# Patient Record
Sex: Female | Born: 1945 | Race: Black or African American | Hispanic: No | Smoking: Never smoker
Health system: Southern US, Community
[De-identification: ages and names within clinical notes are randomized; demographics above are authoritative.]

## PROBLEM LIST (undated history)

## (undated) DIAGNOSIS — I10 Essential (primary) hypertension: Secondary | ICD-10-CM

## (undated) DIAGNOSIS — C539 Malignant neoplasm of cervix uteri, unspecified: Secondary | ICD-10-CM

## (undated) DIAGNOSIS — D649 Anemia, unspecified: Secondary | ICD-10-CM

## (undated) DIAGNOSIS — I739 Peripheral vascular disease, unspecified: Secondary | ICD-10-CM

## (undated) DIAGNOSIS — I639 Cerebral infarction, unspecified: Secondary | ICD-10-CM

## (undated) HISTORY — PX: ABDOMINAL HYSTERECTOMY: SHX81

## (undated) HISTORY — PX: TONSILLECTOMY: SUR1361

## (undated) HISTORY — PX: FEMORAL ARTERY - FEMORAL ARTERY BYPASS GRAFT: SUR179

---

## 2015-01-08 ENCOUNTER — Emergency Department (HOSPITAL_BASED_OUTPATIENT_CLINIC_OR_DEPARTMENT_OTHER)
Admission: EM | Admit: 2015-01-08 | Discharge: 2015-01-08 | Disposition: A | Discharge status: Home / Self Care | Payer: Medicare Other | Attending: Emergency Medicine | Admitting: Emergency Medicine

## 2015-01-08 ENCOUNTER — Encounter (HOSPITAL_BASED_OUTPATIENT_CLINIC_OR_DEPARTMENT_OTHER): Payer: Self-pay | Admitting: *Deleted

## 2015-01-08 DIAGNOSIS — Z8541 Personal history of malignant neoplasm of cervix uteri: Secondary | ICD-10-CM | POA: Diagnosis not present

## 2015-01-08 DIAGNOSIS — D649 Anemia, unspecified: Secondary | ICD-10-CM | POA: Insufficient documentation

## 2015-01-08 DIAGNOSIS — Z8673 Personal history of transient ischemic attack (TIA), and cerebral infarction without residual deficits: Secondary | ICD-10-CM | POA: Insufficient documentation

## 2015-01-08 DIAGNOSIS — R030 Elevated blood-pressure reading, without diagnosis of hypertension: Secondary | ICD-10-CM

## 2015-01-08 DIAGNOSIS — Z79899 Other long term (current) drug therapy: Secondary | ICD-10-CM | POA: Insufficient documentation

## 2015-01-08 DIAGNOSIS — I10 Essential (primary) hypertension: Secondary | ICD-10-CM | POA: Diagnosis present

## 2015-01-08 HISTORY — DX: Essential (primary) hypertension: I10

## 2015-01-08 HISTORY — DX: Peripheral vascular disease, unspecified: I73.9

## 2015-01-08 HISTORY — DX: Malignant neoplasm of cervix uteri, unspecified: C53.9

## 2015-01-08 HISTORY — DX: Cerebral infarction, unspecified: I63.9

## 2015-01-08 HISTORY — DX: Anemia, unspecified: D64.9

## 2015-01-08 LAB — BASIC METABOLIC PANEL
Anion gap: 9 (ref 5–15)
BUN: 12 mg/dL (ref 6–20)
CALCIUM: 8.9 mg/dL (ref 8.9–10.3)
CO2: 27 mmol/L (ref 22–32)
Chloride: 107 mmol/L (ref 101–111)
Creatinine, Ser: 0.94 mg/dL (ref 0.44–1.00)
GLUCOSE: 109 mg/dL — AB (ref 65–99)
POTASSIUM: 3.9 mmol/L (ref 3.5–5.1)
SODIUM: 143 mmol/L (ref 135–145)

## 2015-01-08 LAB — CBC WITH DIFFERENTIAL/PLATELET
BASOS ABS: 0 10*3/uL (ref 0.0–0.1)
Basophils Relative: 1 % (ref 0–1)
EOS PCT: 2 % (ref 0–5)
Eosinophils Absolute: 0.1 10*3/uL (ref 0.0–0.7)
HCT: 28.7 % — ABNORMAL LOW (ref 36.0–46.0)
Hemoglobin: 9.2 g/dL — ABNORMAL LOW (ref 12.0–15.0)
LYMPHS ABS: 0.9 10*3/uL (ref 0.7–4.0)
LYMPHS PCT: 18 % (ref 12–46)
MCH: 30.3 pg (ref 26.0–34.0)
MCHC: 32.1 g/dL (ref 30.0–36.0)
MCV: 94.4 fL (ref 78.0–100.0)
Monocytes Absolute: 0.5 10*3/uL (ref 0.1–1.0)
Monocytes Relative: 10 % (ref 3–12)
NEUTROS PCT: 69 % (ref 43–77)
Neutro Abs: 3.6 10*3/uL (ref 1.7–7.7)
Platelets: 152 10*3/uL (ref 150–400)
RBC: 3.04 MIL/uL — AB (ref 3.87–5.11)
RDW: 12.6 % (ref 11.5–15.5)
WBC: 5.2 10*3/uL (ref 4.0–10.5)

## 2015-01-08 NOTE — ED Notes (Signed)
Pt c/o increased BP x 24 hrs

## 2015-01-08 NOTE — ED Provider Notes (Signed)
CSN: [id number]     Arrival date & time 01/08/15  0044 History   First MD Initiated Contact with Patient 01/08/15 0125     Chief Complaint  Patient presents with  . Hypertension     (Consider location/radiation/quality/duration/timing/severity/associated sxs/prior Treatment) HPI  This is a 69 year old female with a history of hypertension on carvedilol. Her blood pressures usually run 140s-160s/70s-80s. Yesterday morning her blood pressure was within his usual range. Yesterday evening it was as high as 201/116 and repeated it was 199/117. On arrival here it was 207/84. Her only complaint is some mild pressure in her left forehead. She denies any blurred vision, numbness or weakness, chest pain, shortness of breath, vomiting or diarrhea. She did have some transient nausea yesterday.  Past Medical History  Diagnosis Date  . Hypertension   . Cervical cancer   . Stroke   . Peripheral vascular disease    Past Surgical History  Procedure Laterality Date  . Abdominal hysterectomy    . Cesarean section    . Tonsillectomy    . 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 No pertinent family history. History  Substance Use Topics  . Smoking status: Never Smoker   . Smokeless tobacco: Not on file  . Alcohol Use: No   OB History    No data available     Review of Systems  All other systems reviewed and are negative.   Allergies  Caffeine and Codeine  Home Medications   Prior to Admission medications   Medication Sig Start Date End Date Taking? Authorizing Provider  carvedilol (COREG) 12.5 MG tablet Take 12.5 mg by mouth 2 (two) times daily with a meal.   Yes Historical Provider, MD  PARoxetine (PAXIL) 20 MG tablet Take 20 mg by mouth daily.   Yes Historical Provider, MD  Pitavastatin Calcium (LIVALO PO) Take by mouth.   Yes Historical Provider, MD  Vitamin D, Ergocalciferol, (DRISDOL) 50000 UNITS CAPS capsule Take 50,000 Units by mouth every 7 (seven)  days.   Yes Historical Provider, MD   BP 207/84 mmHg  Pulse 75  Temp(Src) 98.7 F (37.1 C) (Oral)  Resp 18  Ht 5\' 1"  (1.549 m)  Wt 216 lb (97.977 kg)  BMI 40.83 kg/m2  SpO2 99%   Physical Exam  General: Well-developed, well-nourished female in no acute distress; appearance consistent with age of record HENT: normocephalic; atraumatic Eyes: pupils equal, round and reactive to light; extraocular muscles intact; lens implants Neck: supple Heart: regular rate and rhythm Lungs: clear to auscultation bilaterally Abdomen: soft; nondistended; nontender; no masses or hepatosplenomegaly; bowel sounds present Extremities: No deformity; full range of motion; pulses normal Neurologic: Awake, alert and oriented; motor function intact in all extremities and symmetric; no facial droop Skin: Warm and dry Psychiatric: Normal mood and affect    ED Course  Procedures (including critical care time)   MDM  Nursing notes and vitals signs, including pulse oximetry, reviewed.  Summary of this visit's results, reviewed by myself:  Labs:  Results for orders placed or performed during the hospital encounter of 01/08/15 (from the past 24 hour(s))  CBC with Differential/Platelet     Status: Abnormal   Collection Time: 01/08/15  1:50 AM  Result Value Ref Range   WBC 5.2 4.0 - 10.5 K/uL   RBC 3.04 (L) 3.87 - 5.11 MIL/uL   Hemoglobin 9.2 (L) 12.0 - 15.0 g/dL   HCT 28.7 (L) 36.0 - 46.0 %   MCV 94.4  78.0 - 100.0 fL   MCH 30.3 26.0 - 34.0 pg   MCHC 32.1 30.0 - 36.0 g/dL   RDW 12.6 11.5 - 15.5 %   Platelets 152 150 - 400 K/uL   Neutrophils Relative % 69 43 - 77 %   Neutro Abs 3.6 1.7 - 7.7 K/uL   Lymphocytes Relative 18 12 - 46 %   Lymphs Abs 0.9 0.7 - 4.0 K/uL   Monocytes Relative 10 3 - 12 %   Monocytes Absolute 0.5 0.1 - 1.0 K/uL   Eosinophils Relative 2 0 - 5 %   Eosinophils Absolute 0.1 0.0 - 0.7 K/uL   Basophils Relative 1 0 - 1 %   Basophils Absolute 0.0 0.0 - 0.1 K/uL  Basic metabolic  panel     Status: Abnormal   Collection Time: 01/08/15  1:50 AM  Result Value Ref Range   Sodium 143 135 - 145 mmol/L   Potassium 3.9 3.5 - 5.1 mmol/L   Chloride 107 101 - 111 mmol/L   CO2 27 22 - 32 mmol/L   Glucose, Bld 109 (H) 65 - 99 mg/dL   BUN 12 6 - 20 mg/dL   Creatinine, Ser 0.94 0.44 - 1.00 mg/dL   Calcium 8.9 8.9 - 10.3 mg/dL   GFR calc non Af Amer >60 >60 mL/min   GFR calc Af Amer >60 >60 mL/min   Anion gap 9 5 - 15   Patient advised of reassuring lab tests. She is anemic but has a history of this. She was advised not to put excessive worry and to elevated blood pressure. Her daughter states that she frequently takes her blood pressure multiple times out of worry that it is too high. She will follow up with her primary care physician regarding her blood pressure and possible medication adjustments. Acute intervention is not indicated at this time.   Shanon Rosser, MD 01/08/15 [phone]

## 2015-01-08 NOTE — Discharge Instructions (Signed)
Hypertension Hypertension, commonly called high blood pressure, is when the force of blood pumping through your arteries is too strong. Your arteries are the blood vessels that carry blood from your heart throughout your body. A blood pressure reading consists of a higher number over a lower number, such as 110/72. The higher number (systolic) is the pressure inside your arteries when your heart pumps. The lower number (diastolic) is the pressure inside your arteries when your heart relaxes. Ideally you want your blood pressure below 120/80. Hypertension forces your heart to work harder to pump blood. Your arteries may become narrow or stiff. Having hypertension puts you at risk for heart disease, stroke, and other problems.  RISK FACTORS Some risk factors for high blood pressure are controllable. Others are not.  Risk factors you cannot control include:   Race. You may be at higher risk if you are African American.  Age. Risk increases with age.  Gender. Men are at higher risk than women before age 93 years. After age 83, women are at higher risk than men. Risk factors you can control include:  Not getting enough exercise or physical activity.  Being overweight.  Getting too much fat, sugar, calories, or salt in your diet.  Drinking too much alcohol. SIGNS AND SYMPTOMS Hypertension does not usually cause signs or symptoms. Extremely high blood pressure (hypertensive crisis) may cause headache, anxiety, shortness of breath, and nosebleed. DIAGNOSIS  To check if you have hypertension, your health care provider will measure your blood pressure while you are seated, with your arm held at the level of your heart. It should be measured at least twice using the same arm. Certain conditions can cause a difference in blood pressure between your right and left arms. A blood pressure reading that is higher than normal on one occasion does not mean that you need treatment. If one blood pressure reading  is high, ask your health care provider about having it checked again. TREATMENT  Treating high blood pressure includes making lifestyle changes and possibly taking medicine. Living a healthy lifestyle can help lower high blood pressure. You may need to change some of your habits. Lifestyle changes may include:  Following the DASH diet. This diet is high in fruits, vegetables, and whole grains. It is low in salt, red meat, and added sugars.  Getting at least 2 hours of brisk physical activity every week.  Losing weight if necessary.  Not smoking.  Limiting alcoholic beverages.  Learning ways to reduce stress. If lifestyle changes are not enough to get your blood pressure under control, your health care provider may prescribe medicine. You may need to take more than one. Work closely with your health care provider to understand the risks and benefits. HOME CARE INSTRUCTIONS  Have your blood pressure rechecked as directed by your health care provider.   Take medicines only as directed by your health care provider. Follow the directions carefully. Blood pressure medicines must be taken as prescribed. The medicine does not work as well when you skip doses. Skipping doses also puts you at risk for problems.   Do not smoke.   Monitor your blood pressure at home as directed by your health care provider.  SEEK IMMEDIATE MEDICAL CARE IF:  You develop a severe headache or confusion.  You have unusual weakness, numbness, or feel faint.  You have severe chest or abdominal pain.  You vomit repeatedly.  You have trouble breathing. MAKE SURE YOU:   Understand these instructions.  Will watch  your condition.  Will get help right away if you are not doing well or get worse. Document Released: 08/09/2005 Document Revised: 12/24/2013 Document Reviewed: 06/01/2013 [facility] Patient Information 2015 [place], [place]. This information is not intended to replace advice given to you by your  health care provider. Make sure you discuss any questions you have with your health care provider.

## 2015-07-22 ENCOUNTER — Other Ambulatory Visit: Payer: Self-pay

## 2015-07-22 ENCOUNTER — Encounter (HOSPITAL_BASED_OUTPATIENT_CLINIC_OR_DEPARTMENT_OTHER): Payer: Self-pay | Admitting: Emergency Medicine

## 2015-07-22 ENCOUNTER — Emergency Department (HOSPITAL_BASED_OUTPATIENT_CLINIC_OR_DEPARTMENT_OTHER)
Admission: EM | Admit: 2015-07-22 | Discharge: 2015-07-22 | Disposition: A | Discharge status: Home / Self Care | Payer: Medicare Other | Attending: Emergency Medicine | Admitting: Emergency Medicine

## 2015-07-22 DIAGNOSIS — N390 Urinary tract infection, site not specified: Secondary | ICD-10-CM | POA: Diagnosis not present

## 2015-07-22 DIAGNOSIS — I10 Essential (primary) hypertension: Secondary | ICD-10-CM | POA: Insufficient documentation

## 2015-07-22 DIAGNOSIS — D649 Anemia, unspecified: Secondary | ICD-10-CM | POA: Diagnosis not present

## 2015-07-22 DIAGNOSIS — R51 Headache: Secondary | ICD-10-CM | POA: Diagnosis not present

## 2015-07-22 DIAGNOSIS — Z8673 Personal history of transient ischemic attack (TIA), and cerebral infarction without residual deficits: Secondary | ICD-10-CM | POA: Diagnosis not present

## 2015-07-22 DIAGNOSIS — Z8541 Personal history of malignant neoplasm of cervix uteri: Secondary | ICD-10-CM | POA: Insufficient documentation

## 2015-07-22 DIAGNOSIS — Z79899 Other long term (current) drug therapy: Secondary | ICD-10-CM | POA: Diagnosis not present

## 2015-07-22 LAB — URINALYSIS, ROUTINE W REFLEX MICROSCOPIC
Bilirubin Urine: NEGATIVE
GLUCOSE, UA: NEGATIVE mg/dL
HGB URINE DIPSTICK: NEGATIVE
KETONES UR: NEGATIVE mg/dL
Nitrite: NEGATIVE
PH: 7.5 (ref 5.0–8.0)
Protein, ur: NEGATIVE mg/dL
Specific Gravity, Urine: 1.02 (ref 1.005–1.030)

## 2015-07-22 LAB — URINE MICROSCOPIC-ADD ON

## 2015-07-22 MED ORDER — CEPHALEXIN 500 MG PO CAPS: 500.0000 mg | ORAL_CAPSULE | Freq: Two times a day (BID) | ORAL | Status: DC

## 2015-07-22 NOTE — Discharge Instructions (Signed)
Hypertension Hypertension, commonly called high blood pressure, is when the force of blood pumping through your arteries is too strong. Your arteries are the blood vessels that carry blood from your heart throughout your body. A blood pressure reading consists of a higher number over a lower number, such as 110/72. The higher number (systolic) is the pressure inside your arteries when your heart pumps. The lower number (diastolic) is the pressure inside your arteries when your heart relaxes. Ideally you want your blood pressure below 120/80. Hypertension forces your heart to work harder to pump blood. Your arteries may become narrow or stiff. Having untreated or uncontrolled hypertension can cause heart attack, stroke, kidney disease, and other problems. RISK FACTORS Some risk factors for high blood pressure are controllable. Others are not.  Risk factors you cannot control include:   Race. You may be at higher risk if you are African American.  Age. Risk increases with age.  Gender. Men are at higher risk than women before age 45 years. After age 65, women are at higher risk than men. Risk factors you can control include:  Not getting enough exercise or physical activity.  Being overweight.  Getting too much fat, sugar, calories, or salt in your diet.  Drinking too much alcohol. SIGNS AND SYMPTOMS Hypertension does not usually cause signs or symptoms. Extremely high blood pressure (hypertensive crisis) may cause headache, anxiety, shortness of breath, and nosebleed. DIAGNOSIS To check if you have hypertension, your health care provider will measure your blood pressure while you are seated, with your arm held at the level of your heart. It should be measured at least twice using the same arm. Certain conditions can cause a difference in blood pressure between your right and left arms. A blood pressure reading that is higher than normal on one occasion does not mean that you need treatment. If  it is not clear whether you have high blood pressure, you may be asked to return on a different day to have your blood pressure checked again. Or, you may be asked to monitor your blood pressure at home for 1 or more weeks. TREATMENT Treating high blood pressure includes making lifestyle changes and possibly taking medicine. Living a healthy lifestyle can help lower high blood pressure. You may need to change some of your habits. Lifestyle changes may include:  Following the DASH diet. This diet is high in fruits, vegetables, and whole grains. It is low in salt, red meat, and added sugars.  Keep your sodium intake below 2,300 mg per day.  Getting at least 30-45 minutes of aerobic exercise at least 4 times per week.  Losing weight if necessary.  Not smoking.  Limiting alcoholic beverages.  Learning ways to reduce stress. Your health care provider may prescribe medicine if lifestyle changes are not enough to get your blood pressure under control, and if one of the following is true:  You are 18-59 years of age and your systolic blood pressure is above 140.  You are 60 years of age or older, and your systolic blood pressure is above 150.  Your diastolic blood pressure is above 90.  You have diabetes, and your systolic blood pressure is over 140 or your diastolic blood pressure is over 90.  You have kidney disease and your blood pressure is above 140/90.  You have heart disease and your blood pressure is above 140/90. Your personal target blood pressure may vary depending on your medical conditions, your age, and other factors. HOME CARE INSTRUCTIONS    Have your blood pressure rechecked as directed by your health care provider.   Take medicines only as directed by your health care provider. Follow the directions carefully. Blood pressure medicines must be taken as prescribed. The medicine does not work as well when you skip doses. Skipping doses also puts you at risk for  problems.  Do not smoke.   Monitor your blood pressure at home as directed by your health care provider. SEEK MEDICAL CARE IF:   You think you are having a reaction to medicines taken.  You have recurrent headaches or feel dizzy.  You have swelling in your ankles.  You have trouble with your vision. SEEK IMMEDIATE MEDICAL CARE IF:  You develop a severe headache or confusion.  You have unusual weakness, numbness, or feel faint.  You have severe chest or abdominal pain.  You vomit repeatedly.  You have trouble breathing. MAKE SURE YOU:   Understand these instructions.  Will watch your condition.  Will get help right away if you are not doing well or get worse.   This information is not intended to replace advice given to you by your health care provider. Make sure you discuss any questions you have with your health care provider.   Document Released: 08/09/2005 Document Revised: 12/24/2014 Document Reviewed: 06/01/2013 Elsevier Interactive Patient Education 2016 [employer]  Urinary Tract Infection Urinary tract infections (UTIs) can develop anywhere along your urinary tract. Your urinary tract is your body's drainage system for removing wastes and extra water. Your urinary tract includes two kidneys, two ureters, a bladder, and a urethra. Your kidneys are a pair of bean-shaped organs. Each kidney is about the size of your fist. They are located below your ribs, one on each side of your spine. CAUSES Infections are caused by microbes, which are microscopic organisms, including fungi, viruses, and bacteria. These organisms are so small that they can only be seen through a microscope. Bacteria are the microbes that most commonly cause UTIs. SYMPTOMS  Symptoms of UTIs may vary by age and gender of the patient and by the location of the infection. Symptoms in young women typically include a frequent and intense urge to urinate and a painful, burning feeling in the bladder or  urethra during urination. Older women and men are more likely to be tired, shaky, and weak and have muscle aches and abdominal pain. A fever may mean the infection is in your kidneys. Other symptoms of a kidney infection include pain in your back or sides below the ribs, nausea, and vomiting. DIAGNOSIS To diagnose a UTI, your caregiver will ask you about your symptoms. Your caregiver will also ask you to provide a urine sample. The urine sample will be tested for bacteria and white blood cells. White blood cells are made by your body to help fight infection. TREATMENT  Typically, UTIs can be treated with medication. Because most UTIs are caused by a bacterial infection, they usually can be treated with the use of antibiotics. The choice of antibiotic and length of treatment depend on your symptoms and the type of bacteria causing your infection. HOME CARE INSTRUCTIONS  If you were prescribed antibiotics, take them exactly as your caregiver instructs you. Finish the medication even if you feel better after you have only taken some of the medication.  Drink enough water and fluids to keep your urine clear or pale yellow.  Avoid caffeine, tea, and carbonated beverages. They tend to irritate your bladder.  Empty your bladder often. Avoid holding  urine for long periods of time.  Empty your bladder before and after sexual intercourse.  After a bowel movement, women should cleanse from front to back. Use each tissue only once. SEEK MEDICAL CARE IF:   You have back pain.  You develop a fever.  Your symptoms do not begin to resolve within 3 days. SEEK IMMEDIATE MEDICAL CARE IF:   You have severe back pain or lower abdominal pain.  You develop chills.  You have nausea or vomiting.  You have continued burning or discomfort with urination. MAKE SURE YOU:   Understand these instructions.  Will watch your condition.  Will get help right away if you are not doing well or get worse.   This  information is not intended to replace advice given to you by your health care provider. Make sure you discuss any questions you have with your health care provider.   Document Released: 05/19/2005 Document Revised: 04/30/2015 Document Reviewed: 09/17/2011 Elsevier Interactive Patient Education [employer].

## 2015-07-22 NOTE — ED Notes (Signed)
Elevated bp since yesterday.  Some nausea and left sided headache since yesterday.  No other new symptoms.

## 2015-07-22 NOTE — ED Provider Notes (Signed)
CSN: [id number]     Arrival date & time 07/22/15  [id number] History   First MD Initiated Contact with Patient 07/22/15 0830     Chief Complaint  Patient presents with  . Hypertension     (Consider location/radiation/quality/duration/timing/severity/associated sxs/prior Treatment) Patient is a 69 y.o. female presenting with hypertension. The history is provided by the patient.  Hypertension This is a recurrent problem. Associated symptoms include headaches. Pertinent negatives include no chest pain, no abdominal pain and no shortness of breath.   patient resents with high blood pressure. Reportedly had pressures of 123456 systolic last night. States she was having headache and felt a little anxious with it. No chest pain or trouble breathing. She had some nausea. States she's also had some chills. States she feels better now. States she does have a history of anxiety. She checks her blood pressure and get somewhat nervous about the results. No numbness or weakness. No recent change in her medications.  Past Medical History  Diagnosis Date  . Hypertension   . Cervical cancer ([place])   . Stroke ([place])   . Peripheral vascular disease ([place])   . Anemia    Past Surgical History  Procedure Laterality Date  . Abdominal hysterectomy    . Cesarean section    . Tonsillectomy    . 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 Social History  Substance Use Topics  . Smoking status: Never Smoker   . Smokeless tobacco: None  . Alcohol Use: No   OB History    No data available     Review of Systems  Constitutional: Positive for chills. Negative for activity change and appetite change.  Eyes: Negative for pain.  Respiratory: Negative for chest tightness and shortness of breath.   Cardiovascular: Negative for chest pain and leg swelling.  Gastrointestinal: Negative for nausea, vomiting, abdominal pain and diarrhea.  Genitourinary: Positive for frequency. Negative for  flank pain.  Musculoskeletal: Negative for back pain and neck stiffness.  Skin: Negative for rash.  Neurological: Positive for headaches. Negative for weakness and numbness.  Psychiatric/Behavioral: Negative for behavioral problems.      Allergies  Caffeine and Codeine  Home Medications   Prior to Admission medications   Medication Sig Start Date End Date Taking? Authorizing Provider  ferrous sulfate 325 (65 FE) MG tablet Take 325 mg by mouth daily with breakfast.   Yes Historical Provider, MD  carvedilol (COREG) 12.5 MG tablet Take 12.5 mg by mouth 2 (two) times daily with a meal.    Historical Provider, MD  cephALEXin (KEFLEX) 500 MG capsule Take 1 capsule (500 mg total) by mouth 2 (two) times daily. 07/22/15   Davonna Belling, MD  PARoxetine (PAXIL) 20 MG tablet Take 10 mg by mouth every other day.     Historical Provider, MD  Pitavastatin Calcium (LIVALO PO) Take by mouth.    Historical Provider, MD  Vitamin D, Ergocalciferol, (DRISDOL) 50000 UNITS CAPS capsule Take 50,000 Units by mouth every 7 (seven) days.    Historical Provider, MD   BP 163/91 mmHg  Pulse 74  Temp(Src) 98.4 F (36.9 C) (Oral)  Resp 16  SpO2 99% Physical Exam  Constitutional: She appears well-developed and well-nourished.  HENT:  Head: Normocephalic.  Neck: Normal range of motion.  Cardiovascular: Normal rate, regular rhythm and normal heart sounds.   No murmur heard. Pulmonary/Chest: Effort normal and breath sounds normal. No respiratory distress.  Abdominal: Soft. Bowel sounds are  normal. She exhibits no distension.  Musculoskeletal: Normal range of motion. She exhibits no edema.  Neurological: She is alert.  Skin: Skin is warm.  Psychiatric: She has a normal mood and affect. Her speech is normal.  Nursing note and vitals reviewed.   ED Course  Procedures (including critical care time) Labs Review Labs Reviewed  URINALYSIS, ROUTINE W REFLEX MICROSCOPIC (NOT AT [facility]) - Abnormal; Notable for  the following:    APPearance CLOUDY (*)    Leukocytes, UA MODERATE (*)    All other components within normal limits  URINE MICROSCOPIC-ADD ON - Abnormal; Notable for the following:    Squamous Epithelial / LPF 0-5 (*)    Bacteria, UA FEW (*)    All other components within normal limits    Imaging Review No results found. I have personally reviewed and evaluated these images and lab results as part of my medical decision-making.   EKG Interpretation   Date/Time:  Tuesday July 22 2015 09:26:27 EST Ventricular Rate:  74 PR Interval:  164 QRS Duration: 78 QT Interval:  386 QTC Calculation: 428 R Axis:   -26 Text Interpretation:  Normal sinus rhythm Minimal voltage criteria for  LVH, may be normal variant Borderline ECG Confirmed by Alvino Chapel  MD,  Sylvia Helms [id number]) on 07/22/2015 9:42:22 AM      MDM   Final diagnoses:  Essential hypertension  Lower urinary tract infection    Patient with weaknes and had chills. Doubt end organ damage. Will treat for UTI.     Davonna Belling, MD 07/22/15 1538

## 2015-12-24 ENCOUNTER — Other Ambulatory Visit: Payer: Self-pay | Admitting: Rheumatology

## 2015-12-24 ENCOUNTER — Ambulatory Visit
Admission: RE | Admit: 2015-12-24 | Discharge: 2015-12-24 | Disposition: A | Discharge status: Home / Self Care | Payer: Medicare Other | Source: Ambulatory Visit | Attending: Rheumatology | Admitting: Rheumatology

## 2015-12-24 DIAGNOSIS — M549 Dorsalgia, unspecified: Secondary | ICD-10-CM

## 2016-08-11 DIAGNOSIS — I10 Essential (primary) hypertension: Secondary | ICD-10-CM | POA: Insufficient documentation

## 2016-08-11 DIAGNOSIS — Z79899 Other long term (current) drug therapy: Secondary | ICD-10-CM | POA: Insufficient documentation

## 2016-08-11 DIAGNOSIS — J0100 Acute maxillary sinusitis, unspecified: Secondary | ICD-10-CM | POA: Diagnosis not present

## 2016-08-11 DIAGNOSIS — R350 Frequency of micturition: Secondary | ICD-10-CM | POA: Diagnosis not present

## 2016-08-11 DIAGNOSIS — R51 Headache: Secondary | ICD-10-CM | POA: Diagnosis present

## 2016-08-12 ENCOUNTER — Encounter (HOSPITAL_BASED_OUTPATIENT_CLINIC_OR_DEPARTMENT_OTHER): Payer: Self-pay

## 2016-08-12 ENCOUNTER — Emergency Department (HOSPITAL_BASED_OUTPATIENT_CLINIC_OR_DEPARTMENT_OTHER)
Admission: EM | Admit: 2016-08-12 | Discharge: 2016-08-12 | Disposition: A | Discharge status: Home / Self Care | Payer: Medicare Other | Attending: Emergency Medicine | Admitting: Emergency Medicine

## 2016-08-12 DIAGNOSIS — R350 Frequency of micturition: Secondary | ICD-10-CM

## 2016-08-12 DIAGNOSIS — J0100 Acute maxillary sinusitis, unspecified: Secondary | ICD-10-CM

## 2016-08-12 LAB — URINALYSIS, MICROSCOPIC (REFLEX): RBC / HPF: NONE SEEN RBC/hpf (ref 0–5)

## 2016-08-12 LAB — URINALYSIS, ROUTINE W REFLEX MICROSCOPIC
BILIRUBIN URINE: NEGATIVE
Glucose, UA: NEGATIVE mg/dL
Hgb urine dipstick: NEGATIVE
Ketones, ur: NEGATIVE mg/dL
Nitrite: NEGATIVE
PROTEIN: NEGATIVE mg/dL
Specific Gravity, Urine: 1.012 (ref 1.005–1.030)
pH: 7 (ref 5.0–8.0)

## 2016-08-12 LAB — CBG MONITORING, ED: GLUCOSE-CAPILLARY: 102 mg/dL — AB (ref 65–99)

## 2016-08-12 MED ORDER — DOXYCYCLINE HYCLATE 100 MG PO TABS
100.0000 mg | ORAL_TABLET | Freq: Two times a day (BID) | ORAL | Status: DC
  Administered 2016-08-12: 100 mg via ORAL
  Filled 2016-08-12: qty 1

## 2016-08-12 MED ORDER — DOXYCYCLINE HYCLATE 100 MG PO CAPS: 100.0000 mg | ORAL_CAPSULE | Freq: Two times a day (BID) | ORAL | 0 refills | Status: DC

## 2016-08-12 NOTE — ED Notes (Signed)
Pt has been unable to give a urine sample

## 2016-08-12 NOTE — ED Notes (Signed)
ED Provider at bedside. 

## 2016-08-12 NOTE — ED Triage Notes (Signed)
Pt c/o nasal congestion and left side facial pain for the last two weeks with a dry mouth and frequent urination.  Denies painful urination and denies hx of diabetes.

## 2016-08-12 NOTE — ED Provider Notes (Signed)
[facility] Provider Note: Georgena Spurling, MD, FACEP  CSN: [id number] MRN: [id number] ARRIVAL: 08/11/16 at 2347 ROOM: [place]  Facial Pain   HISTORY OF PRESENT ILLNESS  Katherine Carrillo is a 70 y.o. female with a several week history of sinus fullness on her left side. It involves the left frontal and maxillary sinuses. She is also having a fullness in her left ear which feels like her "canal is closing up". She does not describe fullness is a frank pain. She has been using an over-the-counter nasal steroid without relief. She has also been having frequent urination but no dysuria. She admits to drinking a lot of water.   Past Medical History:  Diagnosis Date  . Anemia   . Cervical cancer ([place])   . Hypertension   . Peripheral vascular disease ([place])   . Stroke [facility])     Past Surgical History:  Procedure Laterality Date  . ABDOMINAL HYSTERECTOMY    . CESAREAN SECTION    . FEMORAL ARTERY - FEMORAL ARTERY BYPASS GRAFT    . TONSILLECTOMY      No family history on file.  Social History  Substance Use Topics  . Smoking status: Never Smoker  . Smokeless tobacco: Not on file  . Alcohol use No    Prior to Admission medications   Medication Sig Start Date End Date Taking? Authorizing Provider  carvedilol (COREG) 12.5 MG tablet Take 12.5 mg by mouth 2 (two) times daily with a meal.    Historical Provider, MD  cephALEXin (KEFLEX) 500 MG capsule Take 1 capsule (500 mg total) by mouth 2 (two) times daily. 07/22/15   Davonna Belling, MD  doxycycline (VIBRAMYCIN) 100 MG capsule Take 1 capsule (100 mg total) by mouth 2 (two) times daily. One po bid x 7 days 08/12/16   Shanon Rosser, MD  ferrous sulfate 325 (65 FE) MG tablet Take 325 mg by mouth daily with breakfast.    Historical Provider, MD  PARoxetine (PAXIL) 20 MG tablet Take 10 mg by mouth every other day.     Historical Provider, MD  Pitavastatin Calcium (LIVALO PO) Take by mouth.    Historical  Provider, MD  Vitamin D, Ergocalciferol, (DRISDOL) 50000 UNITS CAPS capsule Take 50,000 Units by mouth every 7 (seven) days.    Historical Provider, MD    Allergies Caffeine and Codeine   REVIEW OF SYSTEMS  Negative except as noted here or in the History of Present Illness.   PHYSICAL EXAMINATION  Initial Vital Signs Blood pressure 166/88, pulse 78, temperature 98.2 F (36.8 C), temperature source Oral, resp. rate 20, height 5\' 1"  (1.549 m), weight 200 lb (90.7 kg), SpO2 99 %.  Examination General: Well-developed, well-nourished female in no acute distress; appearance consistent with age of record HENT: normocephalic; atraumatic; mild tenderness to percussion over left maxillary sinus; TMs normal Eyes: pupils equal, round and reactive to light; extraocular muscles intact; lens implants Neck: supple Heart: regular rate and rhythm Lungs: clear to auscultation bilaterally Abdomen: soft; nondistended; nontender; no masses or hepatosplenomegaly; bowel sounds present Extremities: No deformity; full range of motion; pulses normal Neurologic: Awake, alert and oriented; motor function intact in all extremities and symmetric; no facial droop Skin: Warm and dry Psychiatric: Normal mood and affect   RESULTS  Summary of this visit's results, reviewed by myself:   EKG Interpretation  Date/Time:    Ventricular Rate:    PR Interval:    QRS Duration:   QT Interval:  QTC Calculation:   R Axis:     Text Interpretation:        Laboratory Studies: Results for orders placed or performed during the hospital encounter of 08/12/16 (from the past 24 hour(s))  CBG monitoring, ED     Status: Abnormal   Collection Time: 08/12/16 12:07 AM  Result Value Ref Range   Glucose-Capillary 102 (H) 65 - 99 mg/dL  Urinalysis, Routine w reflex microscopic     Status: Abnormal   Collection Time: 08/12/16 12:45 AM  Result Value Ref Range   Color, Urine YELLOW YELLOW   APPearance CLEAR CLEAR    Specific Gravity, Urine 1.012 1.005 - 1.030   pH 7.0 5.0 - 8.0   Glucose, UA NEGATIVE NEGATIVE mg/dL   Hgb urine dipstick NEGATIVE NEGATIVE   Bilirubin Urine NEGATIVE NEGATIVE   Ketones, ur NEGATIVE NEGATIVE mg/dL   Protein, ur NEGATIVE NEGATIVE mg/dL   Nitrite NEGATIVE NEGATIVE   Leukocytes, UA SMALL (A) NEGATIVE  Urinalysis, Microscopic (reflex)     Status: Abnormal   Collection Time: 08/12/16 12:45 AM  Result Value Ref Range   RBC / HPF NONE SEEN 0 - 5 RBC/hpf   WBC, UA 0-5 0 - 5 WBC/hpf   Bacteria, UA RARE (A) NONE SEEN   Squamous Epithelial / LPF 0-5 (A) NONE SEEN   Imaging Studies: No results found.  ED COURSE  Nursing notes and initial vitals signs, including pulse oximetry, reviewed.  Vitals:   08/12/16 0002  BP: 166/88  Pulse: 78  Resp: 20  Temp: 98.2 F (36.8 C)  TempSrc: Oral  SpO2: 99%  Weight: 200 lb (90.7 kg)  Height: 5\' 1"  (1.549 m)   2:19 AM We'll treat for likely acute sinusitis. Patient was reassured that her blood sugar and urinalysis were within normal limits.  PROCEDURES    ED DIAGNOSES     ICD-9-CM ICD-10-CM   1. Acute maxillary sinusitis, recurrence not specified 461.0 J01.00   2. Frequent urination 788.41 R35.0        Shanon Rosser, MD 08/12/16 [phone]

## 2016-12-22 ENCOUNTER — Emergency Department (HOSPITAL_BASED_OUTPATIENT_CLINIC_OR_DEPARTMENT_OTHER): Payer: Medicare Other

## 2016-12-22 ENCOUNTER — Observation Stay (HOSPITAL_BASED_OUTPATIENT_CLINIC_OR_DEPARTMENT_OTHER)
Admission: EM | Admit: 2016-12-22 | Discharge: 2016-12-23 | Disposition: A | Discharge status: Home / Self Care | Payer: Medicare Other | Attending: Internal Medicine | Admitting: Internal Medicine

## 2016-12-22 ENCOUNTER — Encounter (HOSPITAL_BASED_OUTPATIENT_CLINIC_OR_DEPARTMENT_OTHER): Payer: Self-pay

## 2016-12-22 ENCOUNTER — Observation Stay (HOSPITAL_COMMUNITY): Payer: Medicare Other

## 2016-12-22 DIAGNOSIS — Z8542 Personal history of malignant neoplasm of other parts of uterus: Secondary | ICD-10-CM | POA: Diagnosis not present

## 2016-12-22 DIAGNOSIS — E785 Hyperlipidemia, unspecified: Secondary | ICD-10-CM | POA: Insufficient documentation

## 2016-12-22 DIAGNOSIS — Z8673 Personal history of transient ischemic attack (TIA), and cerebral infarction without residual deficits: Secondary | ICD-10-CM | POA: Diagnosis not present

## 2016-12-22 DIAGNOSIS — Z6836 Body mass index (BMI) 36.0-36.9, adult: Secondary | ICD-10-CM | POA: Diagnosis not present

## 2016-12-22 DIAGNOSIS — I639 Cerebral infarction, unspecified: Secondary | ICD-10-CM

## 2016-12-22 DIAGNOSIS — E669 Obesity, unspecified: Secondary | ICD-10-CM | POA: Insufficient documentation

## 2016-12-22 DIAGNOSIS — R471 Dysarthria and anarthria: Secondary | ICD-10-CM | POA: Diagnosis present

## 2016-12-22 DIAGNOSIS — Z79899 Other long term (current) drug therapy: Secondary | ICD-10-CM | POA: Diagnosis not present

## 2016-12-22 DIAGNOSIS — R4781 Slurred speech: Secondary | ICD-10-CM | POA: Diagnosis not present

## 2016-12-22 DIAGNOSIS — G459 Transient cerebral ischemic attack, unspecified: Secondary | ICD-10-CM

## 2016-12-22 DIAGNOSIS — Z90710 Acquired absence of both cervix and uterus: Secondary | ICD-10-CM | POA: Diagnosis not present

## 2016-12-22 DIAGNOSIS — I10 Essential (primary) hypertension: Secondary | ICD-10-CM | POA: Diagnosis not present

## 2016-12-22 DIAGNOSIS — Z923 Personal history of irradiation: Secondary | ICD-10-CM | POA: Insufficient documentation

## 2016-12-22 DIAGNOSIS — I739 Peripheral vascular disease, unspecified: Secondary | ICD-10-CM

## 2016-12-22 DIAGNOSIS — Z95820 Peripheral vascular angioplasty status with implants and grafts: Secondary | ICD-10-CM | POA: Insufficient documentation

## 2016-12-22 DIAGNOSIS — D509 Iron deficiency anemia, unspecified: Secondary | ICD-10-CM | POA: Diagnosis not present

## 2016-12-22 DIAGNOSIS — I080 Rheumatic disorders of both mitral and aortic valves: Secondary | ICD-10-CM | POA: Insufficient documentation

## 2016-12-22 DIAGNOSIS — R4701 Aphasia: Secondary | ICD-10-CM

## 2016-12-22 DIAGNOSIS — G458 Other transient cerebral ischemic attacks and related syndromes: Secondary | ICD-10-CM | POA: Diagnosis not present

## 2016-12-22 DIAGNOSIS — R42 Dizziness and giddiness: Secondary | ICD-10-CM | POA: Diagnosis present

## 2016-12-22 LAB — I-STAT CHEM 8, ED
BUN: 13 mg/dL (ref 6–20)
CALCIUM ION: 1.02 mmol/L — AB (ref 1.15–1.40)
CHLORIDE: 101 mmol/L (ref 101–111)
CREATININE: 1.1 mg/dL — AB (ref 0.44–1.00)
GLUCOSE: 91 mg/dL (ref 65–99)
HCT: 33 % — ABNORMAL LOW (ref 36.0–46.0)
Hemoglobin: 11.2 g/dL — ABNORMAL LOW (ref 12.0–15.0)
POTASSIUM: 5.1 mmol/L (ref 3.5–5.1)
Sodium: 132 mmol/L — ABNORMAL LOW (ref 135–145)
TCO2: 24 mmol/L (ref 0–100)

## 2016-12-22 LAB — CBC
HEMATOCRIT: 32.4 % — AB (ref 36.0–46.0)
Hemoglobin: 10.9 g/dL — ABNORMAL LOW (ref 12.0–15.0)
MCH: 31.9 pg (ref 26.0–34.0)
MCHC: 33.6 g/dL (ref 30.0–36.0)
MCV: 94.7 fL (ref 78.0–100.0)
PLATELETS: 148 10*3/uL — AB (ref 150–400)
RBC: 3.42 MIL/uL — ABNORMAL LOW (ref 3.87–5.11)
RDW: 12 % (ref 11.5–15.5)
WBC: 4.5 10*3/uL (ref 4.0–10.5)

## 2016-12-22 LAB — DIFFERENTIAL
BASOS PCT: 0 %
Basophils Absolute: 0 10*3/uL (ref 0.0–0.1)
EOS PCT: 2 %
Eosinophils Absolute: 0.1 10*3/uL (ref 0.0–0.7)
LYMPHS PCT: 15 %
Lymphs Abs: 0.7 10*3/uL (ref 0.7–4.0)
MONO ABS: 0.6 10*3/uL (ref 0.1–1.0)
MONOS PCT: 13 %
NEUTROS ABS: 3.1 10*3/uL (ref 1.7–7.7)
Neutrophils Relative %: 70 %

## 2016-12-22 LAB — PROTIME-INR
INR: 1.03
PROTHROMBIN TIME: 13.5 s (ref 11.4–15.2)

## 2016-12-22 LAB — TROPONIN I: Troponin I: <0.03 ng/mL (ref <0.03)

## 2016-12-22 LAB — APTT: aPTT: 53 seconds — ABNORMAL HIGH (ref 24–36)

## 2016-12-22 LAB — CBG MONITORING, ED: Glucose-Capillary: 97 mg/dL (ref 65–99)

## 2016-12-22 MED ORDER — STROKE: EARLY STAGES OF RECOVERY BOOK: Freq: Once | Status: DC

## 2016-12-22 MED ORDER — ACETAMINOPHEN 160 MG/5ML PO SOLN: 650.0000 mg | ORAL | Status: DC | PRN

## 2016-12-22 MED ORDER — SENNOSIDES-DOCUSATE SODIUM 8.6-50 MG PO TABS: 1.0000 | ORAL_TABLET | Freq: Every evening | ORAL | Status: DC | PRN

## 2016-12-22 MED ORDER — SODIUM CHLORIDE 0.9 % IV SOLN
INTRAVENOUS | Status: AC
  Administered 2016-12-22: 19:00:00 via INTRAVENOUS

## 2016-12-22 MED ORDER — ASPIRIN 325 MG PO TABS: 325.0000 mg | ORAL_TABLET | Freq: Every day | ORAL | Status: DC

## 2016-12-22 MED ORDER — ACETAMINOPHEN 325 MG PO TABS: 650.0000 mg | ORAL_TABLET | ORAL | Status: DC | PRN

## 2016-12-22 MED ORDER — PAROXETINE HCL 20 MG PO TABS: 10.0000 mg | ORAL_TABLET | ORAL | Status: DC

## 2016-12-22 MED ORDER — CLOPIDOGREL BISULFATE 75 MG PO TABS
75.0000 mg | ORAL_TABLET | Freq: Every day | ORAL | Status: DC
  Administered 2016-12-23: 75 mg via ORAL
  Filled 2016-12-22: qty 1

## 2016-12-22 MED ORDER — ACETAMINOPHEN 650 MG RE SUPP: 650.0000 mg | RECTAL | Status: DC | PRN

## 2016-12-22 MED ORDER — ASPIRIN 300 MG RE SUPP: 300.0000 mg | Freq: Every day | RECTAL | Status: DC

## 2016-12-22 MED ORDER — ENOXAPARIN SODIUM 40 MG/0.4ML ~~LOC~~ SOLN
40.0000 mg | SUBCUTANEOUS | Status: DC
  Administered 2016-12-22: 40 mg via SUBCUTANEOUS
  Filled 2016-12-22: qty 0.4

## 2016-12-22 NOTE — H&P (Signed)
[facility] History and Physical  Katherine Carrillo YKD:[id number] DOB: [birth year] DOA: 12/22/2016   PCP: Velna Ochs, MD  Specialists: None  Chief Complaint: Difficulty speaking  HPI: Katherine Carrillo is a 71 y.o. female the past history of hypertension, previous history of stroke about 5 years ago when she was in [place], history of peripheral vascular disease, status post femoral bypass surgery to the right leg who was in her usual state of health till around 10:30 this morning when she was walking with her daughter and realized that she was having difficulty speaking. She could think of the words, but could not speak them out. Her daughter also noticed this difficulty. Then she started having some weakness in the right arm and leg. She felt unsteady on her feet. Denies any headaches, fever, chills. Denies any vision disturbances. Denies any difficulty swallowing. No nausea, vomiting. No tingling or numbness. No seizure type activity. She is on 81 mg of aspirin on a daily basis apart from her other medications. She takes her medications regularly. She went to the emergency department. She feels that her symptoms have improved about 20-30%.  After arriving to the emergency department her symptoms started improving. Discussions were held with neurologist, who felt that her symptoms were to mild for TPA. She was however, hospitalized for further management.  Home Medications: This list may not be accurate. Pharmacy to do medication reconciliation. Prior to Admission medications   Medication Sig Start Date End Date Taking? Authorizing Provider  carvedilol (COREG) 12.5 MG tablet Take 12.5 mg by mouth 2 (two) times daily with a meal.    Historical Provider, MD  ferrous sulfate 325 (65 FE) MG tablet Take 325 mg by mouth daily with breakfast.    Historical Provider, MD  PARoxetine (PAXIL) 20 MG tablet Take 10 mg by mouth every other day.     Historical Provider, MD  Pitavastatin Calcium (LIVALO  PO) Take by mouth.    Historical Provider, MD  Vitamin D, Ergocalciferol, (DRISDOL) 50000 UNITS CAPS capsule Take 50,000 Units by mouth every 7 (seven) days.    Historical Provider, MD    Allergies:  Allergies  Allergen Reactions  . Caffeine   . Codeine   States that she has tried taking a statin previously and has had side effects.  Past Medical History: Past Medical History:  Diagnosis Date  . Anemia   . Cervical cancer ([place])   . Hypertension   . Peripheral vascular disease ([place])   . Stroke [facility])     Past Surgical History:  Procedure Laterality Date  . ABDOMINAL HYSTERECTOMY    . CESAREAN SECTION    . FEMORAL ARTERY - FEMORAL ARTERY BYPASS GRAFT    . TONSILLECTOMY      Social History: Daughter lives with her. She denies smoking. No alcohol use. Denies any illicit drug use. Independent with her daily activities. Occasionally has to use a cane due to right hip pain.  Family History:  Family History  Problem Relation Age of Onset  . Diabetes Sister   . Diabetes Brother      Review of Systems - General ROS: positive for  - fatigue Psychological ROS: negative Ophthalmic ROS: negative ENT ROS: Sinus and left ear congestion Allergy and Immunology ROS: negative Hematological and Lymphatic ROS: negative Endocrine ROS: negative Respiratory ROS: no cough, shortness of breath, or wheezing Cardiovascular ROS: no chest pain or dyspnea on exertion Gastrointestinal ROS: no abdominal pain, change in bowel habits, or black or bloody stools Genito-Urinary ROS:  no dysuria, trouble voiding, or hematuria Musculoskeletal ROS: negative Neurological ROS: as in hpi Dermatological ROS: negative  Physical Examination  Vitals:   12/22/16 1541 12/22/16 1600 12/22/16 1631 12/22/16 1734  BP: (!) 145/78 (!) 148/72 (!) 147/89 (!) 152/73  Pulse: 64 64 99 64  Resp: (!) 22 (!) 25 18 20   Temp:    97.8 F (36.6 C)  TempSrc:    Oral  SpO2: 100% 99% 99% 98%    BP (!) 152/73 (BP Location:  Left Arm)   Pulse 64   Temp 97.8 F (36.6 C) (Oral)   Resp 20   SpO2 98%   General appearance: alert, cooperative, appears stated age and no distress Head: Normocephalic, without obvious abnormality, atraumatic Eyes: conjunctivae/corneas clear. PERRL, EOM's intact.  Throat: lips, mucosa, and tongue normal; teeth and gums normal Neck: no adenopathy, no carotid bruit, no JVD, supple, symmetrical, trachea midline and thyroid not enlarged, symmetric, no tenderness/mass/nodules Resp: clear to auscultation bilaterally Cardio: regular rate and rhythm, S1, S2 normal, no murmur, click, rub or gallop GI: soft, non-tender; bowel sounds normal; no masses,  no organomegaly Extremities: extremities normal, atraumatic, no cyanosis or edema Pulses: 2+ and symmetric Skin: Skin color, texture, turgor normal. No rashes or lesions Lymph nodes: Cervical, supraclavicular, and axillary nodes normal. Neurologic: Awake and alert. Oriented 3. Cranial nerves evaluated. No facial asymmetry. Tongue is midline. Expressive aphasia is noted. No pronator drift. Finger to nose normal. Motor strength equal bilateral upper and lower extremity.   Labs on Admission: I have personally reviewed following labs and imaging studies  CBC:  Recent Labs Lab 12/22/16 1304 12/22/16 1316  WBC 4.5  --   NEUTROABS 3.1  --   HGB 10.9* 11.2*  HCT 32.4* 33.0*  MCV 94.7  --   PLT 148*  --    Basic Metabolic Panel:  Recent Labs Lab 12/22/16 1316  NA 132*  K 5.1  CL 101  GLUCOSE 91  BUN 13  CREATININE 1.10*   Coagulation Profile:  Recent Labs Lab 12/22/16 1304  INR 1.03   Cardiac Enzymes:  Recent Labs Lab 12/22/16 1304  TROPONINI <0.03   Radiological Exams on Admission: Ct Head Code Stroke W/o Cm  Result Date: 12/22/2016 CLINICAL DATA:  Code stroke. 71 year old female with slurred speech, abnormal speech since 1030 hours today. Initial encounter. EXAM: CT HEAD WITHOUT CONTRAST TECHNIQUE: Contiguous axial  images were obtained from the base of the skull through the vertex without intravenous contrast. COMPARISON:  None. FINDINGS: Brain: No midline shift, mass effect, or evidence of intracranial mass lesion. No ventriculomegaly. No acute intracranial hemorrhage identified. Evidence of disproportionate cortical volume loss or encephalomalacia in the right parietal lobe (series 2, image 24 and sagittal image 22). No cortically based acute infarct identified. Gray-white matter differentiation elsewhere in the brain appears normal. Vascular: No suspicious intracranial vascular hyperdensity. Mild Calcified atherosclerosis at the skull base. Skull: Hyperostosis of the calvarium, normal variant. No acute osseous abnormality identified. Sinuses/Orbits: Clear. Other: Visualized orbits and scalp soft tissues are within normal limits. ASPECTS [facility] Stroke Program Early CT Score) - Ganglionic level infarction (caudate, lentiform nuclei, internal capsule, insula, M1-M3 cortex): 7 - Supraganglionic infarction (M4-M6 cortex): 3 Total score (0-10 with 10 being normal): 10 IMPRESSION: 1. No acute intracranial hemorrhage or acute cortically based infarct identified. 2. ASPECTS is 10. 3. Evidence of disproportionate right parietal lobe cerebral atrophy or less likely chronic cortical encephalomalacia. 4. Study discussed by telephone with Dr. Marda Stalker on 12/22/2016 at 13:07 .  Electronically Signed   By: Genevie Ann M.D.   On: 12/22/2016 13:08    My interpretation of Electrocardiogram: Sinus rhythm in the 70s. Normal axis. Intervals are normal. Nonspecific T-wave changes. No concerning ST changes.   Problem List  Principal Problem:   TIA (transient ischemic attack) Active Problems:   Dysarthria   Essential hypertension   History of stroke   PVD (peripheral vascular disease) (HCC)   Assessment: This is a 71 year old African-American female with past medical history as stated earlier, who presents with difficulty  speaking since about 10:30 this morning. Her symptoms have improved over the past many hours. She does not have any motor deficits on examination. Patient will need further evaluation.  Plan: #1 TIA versus stroke: Patient has had a previous history of stroke with the right-sided weakness about 5 years ago. Most of those deficits have resolved per patient and family. She takes aspirin on a daily basis. She tells me that she has not tolerated a statin previously. We will need to further evaluate this history. She will have further workup including MRI brain, MRA head. Echocardiogram and carotid Dopplers. Aspirin will be continued for now. Will likely need to be changed to an alternative antiplatelet agent at discharge. Lipid panel will be checked. PT and OT and SLP evaluation.  #2 history of essential hypertension: Hold her antihypertensive agents for now. Allow permissive hypertension.  #3 history of peripheral vascular disease: Seems to be stable.  #4 history of iron deficiency anemia: She is on Iron supplements at home.  #5 history of uterine cancer. This was about 9 years ago. She status post hysterectomy. She had radiation treatment at that time. Seems to be in remission.  Pharmacy to reconcile home medications.  DVT Prophylaxis: Lovenox Code Status: Full code Family Communication: Discussed with the patient and her daughter  Disposition Plan: Telemetry  Consults called: Neurology  Admission status: Observation status as anticipate that all workup will be completed by tomorrow.   Further management decisions will depend on results of further testing and patient's response to treatment.   [facility]  [facility] Pager [phone]  If 7PM-7AM, please contact night-coverage [email or web address] Password [facility]  12/22/2016, 6:15 PM

## 2016-12-22 NOTE — ED Notes (Signed)
Patient complaining about BP cuff. BP cuff decreased to q 1 hour while awaiting admission.

## 2016-12-22 NOTE — Progress Notes (Signed)
Patient arrived to room 5M03 by stretcher per [facility]. Patient ambulated with standby assist from [facility] to bathroom and then to the bed. Patient educated on how to use call bell and that she should call out for assistance to get up out of bed to avoid falls. Patient verbalized understanding of this information. Patient's daughter at bedside.

## 2016-12-22 NOTE — Progress Notes (Signed)
     Patient coming from [facility] for stroke workup. Stroke team initially consult by EDP and aware of pts transfer and will see pt in consultation after arrival. Per neurology team patient is not a TPA candidate based on presenting symptoms and timing. Patient had acute onset difficulty speaking at approximately 10:30. Symptoms remain constant and relatively unchanged since onset. CT head performed in ED was unremarkable. Patient has a history of prior CVA and hemiparesis which has slowly resolved over time. Patient is accepted to a telemetry under observation status and will undergo further workup upon arrival.  Linna Darner, MD [place] 12/22/2016, 1:21 PM

## 2016-12-22 NOTE — Progress Notes (Signed)
Neurology Note  Contacted by Dr. Sherry Ruffing about Katherine Carrillo. Per report, she had abrupt onset of difficulty speaking at about 1030 this morning, witnessed by her daughter. He describes word-finding difficulty and stuttering, no other acute deficits. She has prior h/o stroke with residual R hemiparesis though no significant weakness noted by Dr. Sherry Ruffing. CTH has been obtained and I have reviewed the scan. This shows no acute abnormality, no hyperdense vessels. Based upon available info, deficits are mild and would not warrant thrombolytic therapy. Recommend transfer to [facility] for routine stroke admission. May need to reconsider treatment if deficits worsen or new deficits should arise.

## 2016-12-22 NOTE — ED Notes (Signed)
Report to [facility] on 5 central

## 2016-12-22 NOTE — ED Triage Notes (Signed)
c/o dizziness, slurred speech, nausea since 1030a-states "feel like i'm having another stroke"-pt A/O-able to stand from w/c for weight-NAD-denies pain

## 2016-12-22 NOTE — ED Provider Notes (Signed)
[place] DEPT [facility] Provider Note   CSN: [id number] Arrival date & time: 12/22/16  1222     History   Chief Complaint Chief Complaint  Patient presents with  . Dizziness    HPI Katherine Carrillo is a 71 y.o. female.  The history is provided by the patient and a relative.  Neurologic Problem  This is a new problem. The current episode started 1 to 2 hours ago. The problem occurs constantly. The problem has not changed since onset.Pertinent negatives include no chest pain, no abdominal pain, no headaches and no shortness of breath. Nothing aggravates the symptoms. Nothing relieves the symptoms. She has tried nothing for the symptoms. The treatment provided no relief.    Past Medical History:  Diagnosis Date  . Anemia   . Cervical cancer ([place])   . Hypertension   . Peripheral vascular disease ([place])   . Stroke [facility])     There are no active problems to display for this patient.   Past Surgical History:  Procedure Laterality Date  . ABDOMINAL HYSTERECTOMY    . CESAREAN SECTION    . FEMORAL ARTERY - FEMORAL ARTERY BYPASS GRAFT    . TONSILLECTOMY      OB History    No data available       Home Medications    Prior to Admission medications   Medication Sig Start Date End Date Taking? Authorizing Provider  carvedilol (COREG) 12.5 MG tablet Take 12.5 mg by mouth 2 (two) times daily with a meal.    Historical Provider, MD  ferrous sulfate 325 (65 FE) MG tablet Take 325 mg by mouth daily with breakfast.    Historical Provider, MD  PARoxetine (PAXIL) 20 MG tablet Take 10 mg by mouth every other day.     Historical Provider, MD  Pitavastatin Calcium (LIVALO PO) Take by mouth.    Historical Provider, MD  Vitamin D, Ergocalciferol, (DRISDOL) 50000 UNITS CAPS capsule Take 50,000 Units by mouth every 7 (seven) days.    Historical Provider, MD    Family History No family history on file.  Social History Social History  Substance Use Topics  . Smoking status: Never Smoker    . Smokeless tobacco: Never Used  . Alcohol use No     Allergies   Caffeine and Codeine   Review of Systems Review of Systems  Constitutional: Negative for chills, diaphoresis, fatigue and fever.  HENT: Negative for congestion.   Eyes: Negative for visual disturbance.  Respiratory: Negative for cough, chest tightness, shortness of breath, wheezing and stridor.   Cardiovascular: Negative for chest pain.  Gastrointestinal: Negative for abdominal pain, diarrhea, nausea and vomiting.  Genitourinary: Negative for dysuria and flank pain.  Musculoskeletal: Negative for back pain, neck pain and neck stiffness.  Skin: Negative for rash and wound.  Neurological: Positive for speech difficulty. Negative for seizures, weakness, light-headedness, numbness and headaches.  Psychiatric/Behavioral: Negative for agitation.  All other systems reviewed and are negative.    Physical Exam Updated Vital Signs BP 138/72 (BP Location: Right Arm)   Pulse 71   Temp 98.3 F (36.8 C) (Oral)   Resp 16   SpO2 99%   Physical Exam  Constitutional: She is oriented to person, place, and time. She appears well-developed and well-nourished. No distress.  HENT:  Head: Normocephalic and atraumatic.  Mouth/Throat: Oropharynx is clear and moist.  Eyes: Conjunctivae and EOM are normal. Pupils are equal, round, and reactive to light.  Neck: Normal range of motion. Neck supple.  Cardiovascular: Normal rate, regular rhythm, normal heart sounds and intact distal pulses.   No murmur heard. Pulmonary/Chest: Effort normal and breath sounds normal. No stridor. No respiratory distress. She has no wheezes. She has no rales. She exhibits no tenderness.  Abdominal: Soft. She exhibits no distension. There is no tenderness.  Musculoskeletal: She exhibits no edema or tenderness.  Neurological: She is alert and oriented to person, place, and time. No cranial nerve deficit or sensory deficit. She exhibits normal muscle tone.  Coordination normal. GCS eye subscore is 4. GCS verbal subscore is 5. GCS motor subscore is 6.  Speech abnormality with expressive aphasia.   Skin: Skin is warm and dry. Capillary refill takes less than 2 seconds. No rash noted. She is not diaphoretic.  Psychiatric: She has a normal mood and affect.  Nursing note and vitals reviewed.    ED Treatments / Results  Labs (all labs ordered are listed, but only abnormal results are displayed) Labs Reviewed  APTT - Abnormal; Notable for the following:       Result Value   aPTT 53 (*)    All other components within normal limits  CBC - Abnormal; Notable for the following:    RBC 3.42 (*)    Hemoglobin 10.9 (*)    HCT 32.4 (*)    Platelets 148 (*)    All other components within normal limits  I-STAT CHEM 8, ED - Abnormal; Notable for the following:    Sodium 132 (*)    Creatinine, Ser 1.10 (*)    Calcium, Ion 1.02 (*)    Hemoglobin 11.2 (*)    HCT 33.0 (*)    All other components within normal limits  PROTIME-INR  DIFFERENTIAL  TROPONIN I  LIPID PANEL  CBC  BASIC METABOLIC PANEL  CBG MONITORING, ED  CBG MONITORING, ED    EKG  EKG Interpretation  Date/Time:  Wednesday Dec 22 2016 12:44:45 EDT Ventricular Rate:  75 PR Interval:    QRS Duration: 80 QT Interval:  363 QTC Calculation: 406 R Axis:   25 Text Interpretation:  Sinus rhythm When compared to prior, no significant changes seen.  No STEMI Confirmed by Sherry Ruffing MD, Avy Barlett [phone]) on 12/22/2016 12:50:51 PM       Radiology Ct Head Code Stroke W/o Cm  Result Date: 12/22/2016 CLINICAL DATA:  Code stroke. 71 year old female with slurred speech, abnormal speech since 1030 hours today. Initial encounter. EXAM: CT HEAD WITHOUT CONTRAST TECHNIQUE: Contiguous axial images were obtained from the base of the skull through the vertex without intravenous contrast. COMPARISON:  None. FINDINGS: Brain: No midline shift, mass effect, or evidence of intracranial mass lesion. No  ventriculomegaly. No acute intracranial hemorrhage identified. Evidence of disproportionate cortical volume loss or encephalomalacia in the right parietal lobe (series 2, image 24 and sagittal image 22). No cortically based acute infarct identified. Gray-white matter differentiation elsewhere in the brain appears normal. Vascular: No suspicious intracranial vascular hyperdensity. Mild Calcified atherosclerosis at the skull base. Skull: Hyperostosis of the calvarium, normal variant. No acute osseous abnormality identified. Sinuses/Orbits: Clear. Other: Visualized orbits and scalp soft tissues are within normal limits. ASPECTS [facility] Stroke Program Early CT Score) - Ganglionic level infarction (caudate, lentiform nuclei, internal capsule, insula, M1-M3 cortex): 7 - Supraganglionic infarction (M4-M6 cortex): 3 Total score (0-10 with 10 being normal): 10 IMPRESSION: 1. No acute intracranial hemorrhage or acute cortically based infarct identified. 2. ASPECTS is 10. 3. Evidence of disproportionate right parietal lobe cerebral atrophy or less likely chronic cortical  encephalomalacia. 4. Study discussed by telephone with Dr. Marda Stalker on 12/22/2016 at 13:07 . Electronically Signed   By: Genevie Ann M.D.   On: 12/22/2016 13:08    Procedures Procedures (including critical care time)  Medications Ordered in ED Medications - No data to display   Initial Impression / Assessment and Plan / ED Course  I have reviewed the triage vital signs and the nursing notes.  Pertinent labs & imaging results that were available during my care of the patient were reviewed by me and considered in my medical decision making (see chart for details).     Katherine Carrillo is a 71 y.o. female with a past medical history significant for prior stroke 5 years ago with no residual symptoms, hypertension, peripheral vascular disease with documented femoral bypass grafting on aspirin therapy, and cervical cancer who presents with speech  abnormality. Patient is accompanied by daughter reports that at 10:30 AM, approximately 2 hours ago, patient was last normal. Daughter reports that patient began having difficulty speaking and answering questions. Patient says that she feels like she is "having a stroke".  Patient quickly assessed by me and had difficulty with speaking. Patient was stuttering and had what appeared to be expressive aphasia. Given the time of onset, code stroke was called. No other focal neurologic deficits were seen.  CT of the head showed no significant intracranial hemorrhage. Neurology was called during the code stroke and they recommended admission to [facility] for MRI and further stroke workup.   Final Clinical Impressions(s) / ED Diagnoses   Final diagnoses:  Aphasia    Clinical Impression: 1. Aphasia     Disposition: Admit to Hospitalist service    Courtney Paris, MD 12/22/16 [phone]

## 2016-12-22 NOTE — Consult Note (Signed)
Neurology Consult Note  Reason for Consultation: Possible stroke  Requesting provider: Bonnielee Haff, MD  CC: Trouble getting words out  HPI: This is a 54-yo woman who presented to [facility] ED today after she developed difficulty speaking. She was with her daughter at 71 am today when she suddenly started to have trouble talking. She denies any other deficits. Her daughter took her to the ED where she was noted to have some word-finding difficulty and stuttering but no other abnormalities. CTH was obtained and showed no acute abnormality. She has been transferred to [facility] for evaluation of possible stroke.   She has a reported history of stroke in about 2011 that resulted in right hemiparesis =. This has largely resolved at this point and she states that she has no significant weakness at this point. She also states that her stroke affected her memory. She has a tendency to forget what she wants to say. She also reports that she has to think about what she wants to say or else she may not pronounce things correctly. She has been taking aspirin 81 mg daily at home and states that she has not missed any doses.    Last known well: 1030 am today NHISS score: 0 tPA given?: No   PMH:  Past Medical History:  Diagnosis Date  . Anemia   . Cervical cancer ([place])   . Hypertension   . Peripheral vascular disease ([place])   . Stroke [facility])     PSH:  Past Surgical History:  Procedure Laterality Date  . ABDOMINAL HYSTERECTOMY    . CESAREAN SECTION    . FEMORAL ARTERY - FEMORAL ARTERY BYPASS GRAFT    . TONSILLECTOMY      Family history: No family history on file.  Social history:  Social History   Social History  . Marital status: Widowed    Spouse name: N/A  . Number of children: N/A  . Years of education: N/A   Occupational History  . Not on file.   Social History Main Topics  . Smoking status: Never Smoker  . Smokeless tobacco: Never Used  . Alcohol use No   . Drug use: Unknown  . Sexual activity: No   Other Topics Concern  . Not on file   Social History Narrative  . No narrative on file    Allergies: Allergies  Allergen Reactions  . Caffeine   . Codeine     ROS: As per HPI. A full 14-point review of systems was performed and is otherwise notable for mild tenderness in BLE for several months. She has chronic LE swelling. The remainder of her ROS is negative.   PE:  BP (!) 152/73 (BP Location: Left Arm)   Pulse 64   Temp 97.8 F (36.6 C) (Oral)   Resp 20   SpO2 98%   General: WDWN, no acute distress. AAO x4. Speech clear, no dysarthria. No aphasia. Follows commands briskly. Affect is bright with congruent mood. Comportment is normal.  HEENT: Normocephalic. Neck supple without LAD. MMM, OP clear. Dentition good. Sclerae anicteric. No conjunctival injection.  CV: Regular, 1/6 systolic murmur. Carotid pulses full and symmetric, no bruits. Distal pulses 2+ and symmetric.  Lungs: CTAB.  Abdomen: Soft, obese, non-distended, non-tender. Bowel sounds present x4.  Extremities: She has mild edema BLE with some TTP with pressure to the legs.  Neuro:  CN: Pupils are equal and round. They are symmetrically reactive from 3-->2 mm. Visual fields are full to confrontation.  EOMI without nystagmus. She has hypometric saccades. No reported diplopia. Facial sensation is intact to light touch. Face is symmetric at rest with normal strength and mobility. Hearing is intact to conversational voice. Palate elevates symmetrically and uvula is midline. Voice is normal in tone, pitch and quality. Bilateral SCM and trapezii are 5/5. Tongue is midline with normal bulk and mobility.  Motor: Normal bulk, tone, and strength with the exception of 4+/5 finger extensors, wrist extensors, and triceps on the R. No tremor or other abnormal movements. No drift.  Sensation: Intact to light touch.  DTRs: 3+ in RUE, 2+ otherwise. Toes downgoing bilaterally. No pathologic  reflexes.  Coordination: Finger-to-nose is slower on the right but there is no dysmetria. Finger taps are slower on the R than the L.    Labs:  Lab Results  Component Value Date   WBC 4.5 12/22/2016   HGB 11.2 (L) 12/22/2016   HCT 33.0 (L) 12/22/2016   PLT 148 (L) 12/22/2016   GLUCOSE 91 12/22/2016   NA 132 (L) 12/22/2016   K 5.1 12/22/2016   CL 101 12/22/2016   CREATININE 1.10 (H) 12/22/2016   BUN 13 12/22/2016   CO2 27 01/08/2015   INR 1.03 12/22/2016   PT/INR negative PTT 53 Troponin <0.03   Imaging:  I have personally and independently reviewed the [facility] without contrast from today. This shows no obvious acute abnormality. There is moderate diffuse generalized atrophy. Hyperostosis frontalis is present.   Assessment and Plan:  1. Acute Ischemic Stroke: This is concerning for an acute stroke vs TIA involving the L MCA territory. Known risk factors for cerebrovascular disease in this patient include HTN, prior stroke, PVD, and age. Additional workup will be ordered to include MRI brain, MRA of the head, carotid Dopplers, TTE, fasting lipids, and hemoglobin a1c. Further testing will be determined by results from these initial studies. Recommend antiplatelet therapy with Plavix for secondary stroke prevention. Continue statin with goal LDL less than 70. Ensure adequate glucose control. Allow permissive hypertension in the acute phase, treating only SBP greater than 220 mmHg and/or DBP greater than 110 mmHg. Avoid fever and hyperglycemia as these can extend the infarct and are associated with worse neurologic outcomes. DVT prophylaxis as needed.   2. Word-finding difficulty: This has largely resolved at this time and I did not appreciate any language disturbance on my exam. Continue to monitor.   3. RUE weakness: This is chronic, due to remote stroke. She has mild weakness of the extensors of the R arm. No acute issues, follow.   This was discussed with the patient and her daughter.  Education was provided on the diagnosis and expected evaluation and treatment. They are in agreement with the plan as noted. They were given the opportunity to ask any questions and these were addressed to their satisfaction.   Thank you for this consultation. The stroke team will assume care of the patient starting 12/23/16. Please call if any urgent issues arise.

## 2016-12-23 ENCOUNTER — Observation Stay (HOSPITAL_BASED_OUTPATIENT_CLINIC_OR_DEPARTMENT_OTHER): Payer: Medicare Other

## 2016-12-23 DIAGNOSIS — R4701 Aphasia: Secondary | ICD-10-CM | POA: Diagnosis not present

## 2016-12-23 DIAGNOSIS — I10 Essential (primary) hypertension: Secondary | ICD-10-CM | POA: Diagnosis not present

## 2016-12-23 DIAGNOSIS — I361 Nonrheumatic tricuspid (valve) insufficiency: Secondary | ICD-10-CM | POA: Diagnosis not present

## 2016-12-23 DIAGNOSIS — G458 Other transient cerebral ischemic attacks and related syndromes: Secondary | ICD-10-CM | POA: Diagnosis not present

## 2016-12-23 DIAGNOSIS — I739 Peripheral vascular disease, unspecified: Secondary | ICD-10-CM | POA: Diagnosis not present

## 2016-12-23 LAB — CBC
HCT: 32.3 % — ABNORMAL LOW (ref 36.0–46.0)
Hemoglobin: 10.5 g/dL — ABNORMAL LOW (ref 12.0–15.0)
MCH: 30.6 pg (ref 26.0–34.0)
MCHC: 32.5 g/dL (ref 30.0–36.0)
MCV: 94.2 fL (ref 78.0–100.0)
Platelets: 146 10*3/uL — ABNORMAL LOW (ref 150–400)
RBC: 3.43 MIL/uL — ABNORMAL LOW (ref 3.87–5.11)
RDW: 12.8 % (ref 11.5–15.5)
WBC: 3.7 10*3/uL — AB (ref 4.0–10.5)

## 2016-12-23 LAB — ECHOCARDIOGRAM COMPLETE
HEIGHTINCHES: 61 in
Weight: 3056 oz

## 2016-12-23 LAB — BASIC METABOLIC PANEL
ANION GAP: 8 (ref 5–15)
BUN: 8 mg/dL (ref 6–20)
CALCIUM: 8.8 mg/dL — AB (ref 8.9–10.3)
CO2: 26 mmol/L (ref 22–32)
CREATININE: 0.91 mg/dL (ref 0.44–1.00)
Chloride: 100 mmol/L — ABNORMAL LOW (ref 101–111)
Glucose, Bld: 88 mg/dL (ref 65–99)
Potassium: 4.3 mmol/L (ref 3.5–5.1)
SODIUM: 134 mmol/L — AB (ref 135–145)

## 2016-12-23 LAB — LIPID PANEL
CHOLESTEROL: 222 mg/dL — AB (ref 0–200)
HDL: 50 mg/dL (ref >40)
LDL Cholesterol: 151 mg/dL — ABNORMAL HIGH (ref 0–99)
TRIGLYCERIDES: 104 mg/dL (ref <150)
Total CHOL/HDL Ratio: 4.4 RATIO
VLDL: 21 mg/dL (ref 0–40)

## 2016-12-23 MED ORDER — CLOPIDOGREL BISULFATE 75 MG PO TABS: 75.0000 mg | ORAL_TABLET | Freq: Every day | ORAL | 2 refills | Status: AC

## 2016-12-23 NOTE — Evaluation (Signed)
Physical Therapy Evaluation Patient Details Name: Katherine Carrillo MRN: [id number] DOB: [birth year] Today's Date: 12/23/2016   History of Present Illness  49-yo F with PMH of Anemia; Cervical cancer; Hypertension; Peripheral vascular disease; and Stroke who presented to [facility] ED on 12/22/16 with difficulty speaking. Hx of of stroke in 2011 with largely resolved R hemiparesis. Pt states prev stroke affected memory and expressive abilities. MRI revealed no new infarcts, just Old small cerebellar infarcts and mild chronic small vessel ischemic disease.  Clinical Impression  Pt presents to PT with decreased tolerance for functional mobility due to above.  Today pt states her symptoms have made her nervous and she does not feel back to baseline.  She still appears to have expressive difficulties, requiring extra time for word finding.  She ambulated 180 ft and completed stair training requiring min guard assist for safety.  PTA, she was independent with all mobility and lives with her daughter who works second shift (gone ~2:00 PM- 12:00 AM) in the hospital.  Pt states she is active in her community senior center with walking and exercise classes.  She will benefit from continued skilled therapy with HHPT to maximize functional independence since daughter is not available 24/7.  Will follow acutely.    Follow Up Recommendations Home health PT;Supervision for mobility/OOB    Equipment Recommendations  3in1 (PT) (grab bars for shower/toilet)    Recommendations for Other Services       Precautions / Restrictions Precautions Precautions: Fall Restrictions Weight Bearing Restrictions: No      Mobility  Bed Mobility Overal bed mobility: Modified Independent             General bed mobility comments: Extra time required and pt reached for rails/edge of bed to scoot to edge.  Transfers Overall transfer level: Modified independent Equipment used: None             General  transfer comment: Pt used rail and required extra time for power up. No physical assist required.  Upon return to chair after ambulation, pt states she felt dizzy.  Pt states she feels dizzy from standing up too fast sometimes.  Ambulation/Gait Ambulation/Gait assistance: Min guard Ambulation Distance (Feet): 180 Feet Assistive device: None Gait Pattern/deviations: Step-through pattern;Decreased stride length;Wide base of support Gait velocity: decreased Gait velocity interpretation: Below normal speed for age/gender General Gait Details: Min guard for safety.  Pt offered cane but stated she does not need.  Overall steady slow gait with slight lateral sway.  Stairs Stairs: Yes Stairs assistance: Min guard Stair Management: One rail Left;Step to pattern;Forwards;Sideways Number of Stairs: 4 General stair comments: Min guard for safety and cues required for sequence. Forward step to pattern for ascent and sideways facing rail step to pattern for descent.   Wheelchair Mobility    Modified Rankin (Stroke Patients Only)       Balance Overall balance assessment: Needs assistance Sitting-balance support: No upper extremity supported;Feet supported Sitting balance-Leahy Scale: Good     Standing balance support: No upper extremity supported Standing balance-Leahy Scale: Good                               Pertinent Vitals/Pain Pain Assessment: Faces Faces Pain Scale: Hurts a little bit Pain Location: Back of thighs Pain Descriptors / Indicators: Sore Pain Intervention(s): Limited activity within patient's tolerance;Monitored during session;Repositioned    Home Living Family/patient expects to be discharged to:: Private residence  Living Arrangements: Children (Daughter) Available Help at Discharge: Family;Available PRN/intermittently Type of Home: House Home Access: Stairs to enter Entrance Stairs-Rails: Left Entrance Stairs-Number of Steps: 4 Home Layout:  Multi-level (bedroom and toilet on main, shower on upper) Home Equipment: Cane - single point Additional Comments: uses cane when she feels "lightheaded"    Prior Function Level of Independence: Independent         Comments: Daughter helps with cooking/cleaning     Hand Dominance        Extremity/Trunk Assessment   Upper Extremity Assessment Upper Extremity Assessment: RUE deficits/detail RUE Deficits / Details: Baseline weakness from previous stroke     Lower Extremity Assessment Lower Extremity Assessment: Generalized weakness    Cervical / Trunk Assessment Cervical / Trunk Assessment: Other exceptions (forward head)  Communication   Communication: Expressive difficulties (Slow speech requiring extra time for word finding)  Cognition Arousal/Alertness: Awake/alert Behavior During Therapy: WFL for tasks assessed/performed Overall Cognitive Status: Within Functional Limits for tasks assessed                                        General Comments General comments (skin integrity, edema, etc.): Pt states she walks 3x/week with daughter and has recently joined a senior center with exercise classes like [place] [employer].    Exercises     Assessment/Plan    PT Assessment Patient needs continued PT services  PT Problem List Decreased activity tolerance;Decreased balance;Decreased knowledge of use of DME;Pain       PT Treatment Interventions DME instruction;Gait training;Stair training;Therapeutic exercise;Therapeutic activities;Balance training    PT Goals (Current goals can be found in the Care Plan section)  Acute Rehab PT Goals Patient Stated Goal: pt did not state a goal PT Goal Formulation: With patient Time For Goal Achievement: 12/30/16 Potential to Achieve Goals: Good    Frequency Min 3X/week   Barriers to discharge Decreased caregiver support daughter works    Co-evaluation               AM-PAC PT "6 Clicks" Daily Activity   Outcome Measure Difficulty turning over in bed (including adjusting bedclothes, sheets and blankets)?: Total Difficulty moving from lying on back to sitting on the side of the bed? : Total Difficulty sitting down on and standing up from a chair with arms (e.g., wheelchair, bedside commode, etc,.)?: Total Help needed moving to and from a bed to chair (including a wheelchair)?: A Little Help needed walking in hospital room?: A Little Help needed climbing 3-5 steps with a railing? : A Little 6 Click Score: 12    End of Session Equipment Utilized During Treatment: Gait belt Activity Tolerance: Patient tolerated treatment well Patient left: in chair;with call bell/phone within reach;with chair alarm set Nurse Communication: Mobility status PT Visit Diagnosis: Muscle weakness (generalized) (M62.81);Dizziness and giddiness (R42)    Time: 0630-1601 PT Time Calculation (min) (ACUTE ONLY): 30 min   Charges:         PT G Codes:        Gaetano Net SPT  Gaetano Net 12/23/2016, 12:02 PM

## 2016-12-23 NOTE — Progress Notes (Signed)
Pt discharged home. Discharged instructions were reviewed with pt and daughter. Pt & daughter verbalized understanding.

## 2016-12-23 NOTE — Care Management Obs Status (Signed)
[facility] NOTIFICATION   Patient Details  Name: Katherine Carrillo MRN: [id number] Date of Birth: August 13, 1946   Medicare Observation Status Notification Given:  Yes    Pollie Friar, RN 12/23/2016, 2:42 PM

## 2016-12-23 NOTE — Care Management Note (Signed)
Case Management Note  Patient Details  Name: Katherine Carrillo MRN: [id number] Date of Birth: April 25, 1946  Subjective/Objective:                    Action/Plan: Pt discharging home with orders for [facility] services. CM met with the patient and her daughter and provided them a list of [facility] agencies. She selected [facility]. Drew with [facility] notified and accepted the referral.  Pt with orders for 3 in 1. Patient and daughter refused this equipment.  Daughter going to provide transportation home.  Expected Discharge Date:  12/23/16               Expected Discharge Plan:  [place]  In-House Referral:     Discharge planning Services  CM Consult  Post Acute Care Choice:  Durable Medical Equipment, Home Health (Pt refused 3 in 1) Choice offered to:  Patient, Adult Children  DME Arranged:    DME Agency:     HH Arranged:  PT, OT HH Agency:  [facility]  Status of Service:  Completed, signed off  If discussed at [place] of Stay Meetings, dates discussed:    Additional Comments:  Pollie Friar, RN 12/23/2016, 3:00 PM

## 2016-12-23 NOTE — Progress Notes (Signed)
STROKE TEAM PROGRESS NOTE   HISTORY OF PRESENT ILLNESS (per record) SUBJECTIVE (INTERVAL HISTORY) Patient had carotid dopplers done which no significant stenosis.2-D echo was unremarkable. LDL cholesterol is elevated at 151.   OBJECTIVE Temp:  [97.5 F (36.4 C)-98.5 F (36.9 C)] 98.2 F (36.8 C) (05/03 1047) Pulse Rate:  [60-99] 77 (05/03 1047) Cardiac Rhythm: Normal sinus rhythm (05/03 0700) Resp:  [15-27] 15 (05/03 1047) BP: (135-161)/(59-118) 140/62 (05/03 1047) SpO2:  [97 %-100 %] 100 % (05/03 1047) Weight:  [86.6 kg (191 lb)] 86.6 kg (191 lb) (05/03 0600)  CBC:  Recent Labs Lab 12/22/16 1304 12/22/16 1316 12/23/16 0359  WBC 4.5  --  3.7*  NEUTROABS 3.1  --   --   HGB 10.9* 11.2* 10.5*  HCT 32.4* 33.0* 32.3*  MCV 94.7  --  94.2  PLT 148*  --  146*    Basic Metabolic Panel:  Recent Labs Lab 12/22/16 1316 12/23/16 0359  NA 132* 134*  K 5.1 4.3  CL 101 100*  CO2  --  26  GLUCOSE 91 88  BUN 13 8  CREATININE 1.10* 0.91  CALCIUM  --  8.8*   HgbA1c: No results found for: HGBA1C    PHYSICAL EXAM Pleasant elderly lady not in distress. . Afebrile. Head is nontraumatic. Neck is supple without bruit.    Cardiac exam no murmur or gallop. Lungs are clear to auscultation. Distal pulses are well felt. Neurological Exam ;  Awake  Alert oriented x 3. Normal speech and language.eye movements full without nystagmus.fundi were not visualized. Vision acuity and fields appear normal. Hearing is normal. Palatal movements are normal. Face symmetric. Tongue midline. Normal strength, tone, reflexes and coordination. Diminished fine finger movements on the right. Orbits left over right upper extremity. Minimum right grip weakness. Normal sensation. Gait deferred.  ASSESSMENT/PLAN [name] is a 71 y.o. female with history of previous stroke with memory difficulty, HTN, PVD, anemia, cervical cancer presenting to [facility] with difficulty speaking. She did not receive IV t-PA.    Difficulty speaking, possible TIA from small vessel disease  Code Stroke CT no acute stroke. Disproportionate R parietal atrophy. Aspects 10.    MRI  No acute process. Old small cerebellar infarcts small vessel disease   MRA  Unremarkable   Carotid Doppler  no significant stenosis bilaterally  2D Echo  normal ejection fraction.   LDL 151  HgbA1c pending  Lovenox 40 mg sq daily for VTE prophylaxis  Diet Heart Room service appropriate? Yes; Fluid consistency: Thin  aspirin 81 mg daily prior to admission, changed to clopidogrel 75 mg daily  Patient counseled to be compliant with her antithrombotic medications  Ongoing aggressive stroke risk factor management  Therapy recommendations:  pending   Disposition:  Anticipate return home  Hypertension  Elevated, bettter this am BP goal normotensive  Hyperlipidemia  Home meds:  No statin  Intolerant to statins in the past per pt. 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 She likely had a TIA from small vessel disease. Agree change aspirin to Plavix for stroke prevention and aggressive risk factor modification. Recommend increase dose of home statin medication. Follow-up in the stroke clinic in 6 weeks. Greater than 50% time during this 25 minute visit was spent on counseling and coordination of care about  stroke and TIA risk, prevention and treatment discussion.  Antony Contras, MD Medical Director [place] Pager: [phone] 12/23/2016 3:29 PM   To contact Stroke Continuity provider, please refer to [email or web address] After hours, contact General Neurology

## 2016-12-23 NOTE — Progress Notes (Signed)
  Echocardiogram 2D Echocardiogram has been performed.  Katherine Carrillo T Sheketa Ende 12/23/2016, 1:54 PM

## 2016-12-23 NOTE — Discharge Summary (Signed)
[facility]  Physician Discharge Summary   Patient ID: Katherine Carrillo MRN: [id number] DOB/AGE: 04/19/1946 71 y.o.  Admit date: 12/22/2016 Discharge date: 12/23/2016  PCP: Velna Ochs, MD  DISCHARGE DIAGNOSES:  Principal Problem:   TIA (transient ischemic attack) Active Problems:   Dysarthria   Essential hypertension   History of stroke   PVD (peripheral vascular disease) (HCC)   RECOMMENDATIONS FOR OUTPATIENT FOLLOW UP: 1. Consider outpatient referral to cardiology for moderate to severe MR noted on Echocardiogram 2. Discuss treatment strategy for high cholesterol considering intolerance to statin.  DISCHARGE CONDITION: fair  Diet recommendation: Heart healthy  Filed Weights   12/23/16 0600  Weight: 86.6 kg (191 lb)    INITIAL HISTORY: 71 year old African-American female with a past medical history of hypertension, stroke, peripheral vascular disease, presented with complaints of speech difficulties. Concern was for stroke and she was hospitalized.  Consultations:  Neurology  Procedures: Transthoracic echocardiogram Study Conclusions  - Left ventricle: The cavity size was normal. Systolic function was   normal. The estimated ejection fraction was in the range of 60%   to 65%. Doppler parameters are consistent with abnormal left   ventricular relaxation (grade 1 diastolic dysfunction). Doppler   parameters are consistent with high ventricular filling pressure. - Aortic valve: There was moderate regurgitation. - Mitral valve: There was moderate to severe regurgitation.  Carotid Doppler Bilateral: No significant (1-39%) ICA stenosis. Antegrade vertebral flow.   HOSPITAL COURSE:   TIA Patient has a previous history of stroke with the right-sided weakness about 5 years ago. Most of those deficits have resolved per patient and family. She takes aspirin on a daily basis. She tells me that she has not tolerated a statin previously and experienced  significant side effects. Patient was hospitalized. She underwent MRI of the brain which did not show any stroke. Symptoms have proved significantly. Speech is almost back to normal. Seen by neurology. Recommendation is for her aspirin to be changed over to Plavix. Echocardiogram as above. Carotid Dopplers without any significant stenosis. LDL is noted to be elevated, 151. However, due to history of statin intolerance/significant side effects, unable to prescribe statin and it would be preferable for the patient to discuss this further with her PCP. This was communicated to Dr. Leonie Man as well. He will consider PCSK9 inhibitors. Seen by PT. Home health has been ordered.   Moderate to severe mitral regurgitation. Noted on echocardiogram. She is asymptomatic. PCP to address and consider outpatient referral to cardiology.  History of essential hypertension Continue home medication  History of peripheral vascular disease Seems to be stable.  History of iron deficiency anemia She is on Iron supplements at home.  History of uterine cancer This was about 9 years ago. She status post hysterectomy. She had radiation treatment at that time. Seems to be in remission.  Overall, stable. Okay for discharge home today.  PERTINENT LABS:  The results of significant diagnostics from this hospitalization (including imaging, microbiology, ancillary and laboratory) are listed below for reference.      Labs: Basic Metabolic Panel:  Recent Labs Lab 12/22/16 1316 12/23/16 0359  NA 132* 134*  K 5.1 4.3  CL 101 100*  CO2  --  26  GLUCOSE 91 88  BUN 13 8  CREATININE 1.10* 0.91  CALCIUM  --  8.8*   CBC:  Recent Labs Lab 12/22/16 1304 12/22/16 1316 12/23/16 0359  WBC 4.5  --  3.7*  NEUTROABS 3.1  --   --   HGB 10.9* 11.2*  10.5*  HCT 32.4* 33.0* 32.3*  MCV 94.7  --  94.2  PLT 148*  --  146*   Cardiac Enzymes:  Recent Labs Lab 12/22/16 1304  TROPONINI <0.03   CBG:  Recent Labs Lab  12/22/16 1230  GLUCAP 97     IMAGING STUDIES Mr Brain Wo Contrast  Result Date: 12/22/2016 CLINICAL DATA:  Acute onset speech difficulties this morning, word-finding difficulty and stuttering. Assess for stroke. History of hypertension, TIA and cervical cancer. EXAM: MRI HEAD WITHOUT CONTRAST MRA HEAD WITHOUT CONTRAST TECHNIQUE: Multiplanar, multiecho pulse sequences of the brain and surrounding structures were obtained without intravenous contrast. Angiographic images of the head were obtained using MRA technique without contrast. COMPARISON:  CT HEAD Dec 22, 2016 at 1254 hours FINDINGS: MRI HEAD FINDINGS BRAIN: No reduced diffusion to suggest acute ischemia. No susceptibility artifact to suggest acute hemorrhage; a few punctate chronic micro hemorrhages associated with chronic hypertension. Old small bilateral cerebellar infarcts. Scattered subcentimeter supratentorial white matter FLAIR T2 hyperintensities compatible with mild chronic small vessel ischemic disease. The ventricles and sulci are normal for patient's age, Atrophic biparietal gyri. No suspicious parenchymal signal, masses or mass effect. No abnormal extra-axial fluid collections. VASCULAR: Normal major intracranial vascular flow voids present at skull base. SKULL AND UPPER CERVICAL SPINE: No abnormal sellar expansion. No suspicious calvarial bone marrow signal. Craniocervical junction maintained. SINUSES/ORBITS: The mastoid air-cells and included paranasal sinuses are well-aerated. The included ocular globes and orbital contents are non-suspicious. Status post bilateral ocular lens implants. OTHER: Patient is edentulous. MRA HEAD FINDINGS ANTERIOR CIRCULATION: Normal flow related enhancement of the included cervical, petrous, cavernous and supraclinoid internal carotid arteries. Normal flow related enhancement of the anterior and middle cerebral arteries, including distal segments. No large vessel occlusion, high-grade stenosis, abnormal  luminal irregularity, aneurysm. POSTERIOR CIRCULATION: LEFT vertebral artery is dominant. Basilar artery is patent, with normal flow related enhancement of the main branch vessels. Normal flow related enhancement of the posterior cerebral arteries. Bilateral posterior communicating arteries present. No large vessel occlusion, high-grade stenosis, abnormal luminal irregularity, aneurysm. ANATOMIC VARIANTS: None. IMPRESSION: MRI HEAD: No acute intracranial process. Old small cerebellar infarcts and mild chronic small vessel ischemic disease. 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 MRA HEAD: Negative. Electronically Signed   By: Elon Alas M.D.   On: 12/22/2016 22:19   Mr Jodene Nam Head/brain RC Cm  Result Date: 12/22/2016 CLINICAL DATA:  Acute onset speech difficulties this morning, word-finding difficulty and stuttering. Assess for stroke. History of hypertension, TIA and cervical cancer. EXAM: MRI HEAD WITHOUT CONTRAST MRA HEAD WITHOUT CONTRAST TECHNIQUE: Multiplanar, multiecho pulse sequences of the brain and surrounding structures were obtained without intravenous contrast. Angiographic images of the head were obtained using MRA technique without contrast. COMPARISON:  CT HEAD Dec 22, 2016 at 1254 hours FINDINGS: MRI HEAD FINDINGS BRAIN: No reduced diffusion to suggest acute ischemia. No susceptibility artifact to suggest acute hemorrhage; a few punctate chronic micro hemorrhages associated with chronic hypertension. Old small bilateral cerebellar infarcts. Scattered subcentimeter supratentorial white matter FLAIR T2 hyperintensities compatible with mild chronic small vessel ischemic disease. The ventricles and sulci are normal for patient's age, Atrophic biparietal gyri. No suspicious parenchymal signal, masses or mass effect. No abnormal extra-axial fluid collections. VASCULAR: Normal major intracranial vascular flow voids present at skull base. SKULL AND UPPER CERVICAL  SPINE: No abnormal sellar expansion. No suspicious calvarial bone marrow signal. Craniocervical junction maintained. SINUSES/ORBITS: The mastoid air-cells and included paranasal sinuses are well-aerated. The included ocular globes and  orbital contents are non-suspicious. Status post bilateral ocular lens implants. OTHER: Patient is edentulous. MRA HEAD FINDINGS ANTERIOR CIRCULATION: Normal flow related enhancement of the included cervical, petrous, cavernous and supraclinoid internal carotid arteries. Normal flow related enhancement of the anterior and middle cerebral arteries, including distal segments. No large vessel occlusion, high-grade stenosis, abnormal luminal irregularity, aneurysm. POSTERIOR CIRCULATION: LEFT vertebral artery is dominant. Basilar artery is patent, with normal flow related enhancement of the main branch vessels. Normal flow related enhancement of the posterior cerebral arteries. Bilateral posterior communicating arteries present. No large vessel occlusion, high-grade stenosis, abnormal luminal irregularity, aneurysm. ANATOMIC VARIANTS: None. IMPRESSION: MRI HEAD: No acute intracranial process. Old small cerebellar infarcts and mild chronic small vessel ischemic disease. 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 MRA HEAD: Negative. Electronically Signed   By: Elon Alas M.D.   On: 12/22/2016 22:19   Ct Head Code Stroke W/o Cm  Result Date: 12/22/2016 CLINICAL DATA:  Code stroke. 71 year old female with slurred speech, abnormal speech since 1030 hours today. Initial encounter. EXAM: CT HEAD WITHOUT CONTRAST TECHNIQUE: Contiguous axial images were obtained from the base of the skull through the vertex without intravenous contrast. COMPARISON:  None. FINDINGS: Brain: No midline shift, mass effect, or evidence of intracranial mass lesion. No ventriculomegaly. No acute intracranial hemorrhage identified. Evidence of disproportionate cortical volume  loss or encephalomalacia in the right parietal lobe (series 2, image 24 and sagittal image 22). No cortically based acute infarct identified. Gray-white matter differentiation elsewhere in the brain appears normal. Vascular: No suspicious intracranial vascular hyperdensity. Mild Calcified atherosclerosis at the skull base. Skull: Hyperostosis of the calvarium, normal variant. No acute osseous abnormality identified. Sinuses/Orbits: Clear. Other: Visualized orbits and scalp soft tissues are within normal limits. ASPECTS [facility] Stroke Program Early CT Score) - Ganglionic level infarction (caudate, lentiform nuclei, internal capsule, insula, M1-M3 cortex): 7 - Supraganglionic infarction (M4-M6 cortex): 3 Total score (0-10 with 10 being normal): 10 IMPRESSION: 1. No acute intracranial hemorrhage or acute cortically based infarct identified. 2. ASPECTS is 10. 3. Evidence of disproportionate right parietal lobe cerebral atrophy or less likely chronic cortical encephalomalacia. 4. Study discussed by telephone with Dr. Marda Stalker on 12/22/2016 at 13:07 . Electronically Signed   By: Genevie Ann M.D.   On: 12/22/2016 13:08    DISCHARGE EXAMINATION: Vitals:   12/23/16 0409 12/23/16 0558 12/23/16 0600 12/23/16 1047  BP: (!) 153/68 (!) 145/85  140/62  Pulse: 69 63  77  Resp: 18 16  15   Temp: 98.1 F (36.7 C) 98.1 F (36.7 C)  98.2 F (36.8 C)  TempSrc: Oral Axillary  Oral  SpO2: 99%   100%  Weight:   86.6 kg (191 lb)   Height:   5\' 1"  (1.549 m)    General appearance: alert, cooperative, appears stated age and no distress Resp: clear to auscultation bilaterally Cardio: regular rate and rhythm, S1, S2 normal, no murmur, click, rub or gallop GI: soft, non-tender; bowel sounds normal; no masses,  no organomegaly Neurologic: Speech has improved significantly. No other focal neurological deficits appreciated.  DISPOSITION: Home with daughter  Discharge Instructions    Call MD for:  extreme fatigue     Complete by:  As directed    Call MD for:  persistant dizziness or light-headedness    Complete by:  As directed    Call MD for:  persistant nausea and vomiting    Complete by:  As directed    Call MD for:  severe uncontrolled  pain    Complete by:  As directed    Call MD for:  temperature >100.4    Complete by:  As directed    Diet - low sodium heart healthy    Complete by:  As directed    Discharge instructions    Complete by:  As directed    Please follow-up with the primary care physician to discuss treating high cholesterol considering your intolerance to statin medication. The results of the echocardiogram will not be back by the time of discharge. Your primary care physician should be able to look up the report.  You were cared for by a hospitalist during your hospital stay. If you have any questions about your discharge medications or the care you received while you were in the hospital after you are discharged, you can call the unit and asked to speak with the hospitalist on call if the hospitalist that took care of you is not available. Once you are discharged, your primary care physician will handle any further medical issues. Please note that NO REFILLS for any discharge medications will be authorized once you are discharged, as it is imperative that you return to your primary care physician (or establish a relationship with a primary care physician if you do not have one) for your aftercare needs so that they can reassess your need for medications and monitor your lab values. If you do not have a primary care physician, you can call [phone] for a physician referral.   Increase activity slowly    Complete by:  As directed       ALLERGIES:  Allergies  Allergen Reactions  . Caffeine Anxiety  . Codeine Anxiety     Current Discharge Medication List    START taking these medications   Details  clopidogrel (PLAVIX) 75 MG tablet Take 1 tablet (75 mg total) by mouth daily. Qty:  30 tablet, Refills: 2      CONTINUE these medications which have NOT CHANGED   Details  carvedilol (COREG) 12.5 MG tablet Take 12.5 mg by mouth 2 (two) times daily with a meal.    ferrous sulfate 325 (65 FE) MG EC tablet Take 325 mg by mouth daily. Refills: 5    lisinopril (PRINIVIL,ZESTRIL) 10 MG tablet Take 10 mg by mouth daily.    Vitamin D, Ergocalciferol, (DRISDOL) 50000 UNITS CAPS capsule Take 50,000 Units by mouth every 7 (seven) days.      STOP taking these medications     aspirin EC 81 MG tablet          Follow-up Information    Velna Ochs, MD. Schedule an appointment as soon as possible for a visit in 1 week(s).   Specialty:  Internal Medicine Why:  post hospital stay follow up and to discuss treating high cholesterol. Contact information: [address] [place] [address] [phone]        SETHI,PRAMOD, MD Follow up in 4 week(s).   Specialties:  Neurology, Radiology Contact information: [address] [place] [address] [phone]           TOTAL DISCHARGE TIME: 23mins  Katherine Carrillo  [facility] Pager [phone]  12/23/2016, 2:22 PM

## 2016-12-23 NOTE — Progress Notes (Signed)
*  PRELIMINARY RESULTS* Vascular Ultrasound Carotid Duplex (Doppler) has been completed.  Preliminary findings: Bilateral: No significant (1-39%) ICA stenosis. Antegrade vertebral flow.     Landry Mellow, RDMS, RVT  12/23/2016, 12:24 PM

## 2016-12-23 NOTE — Discharge Instructions (Signed)
Transient Ischemic Attack °A transient ischemic attack (TIA) is a "warning stroke" that causes stroke-like symptoms. A TIA does not cause lasting damage to the brain. The symptoms of a TIA can happen fast and do not last long. It is important to know the symptoms of a TIA and what to do. This can help prevent stroke or death. °Follow these instructions at home: °· Take medicines only as told by your doctor. Make sure you understand all of the instructions. °· You may need to take aspirin or warfarin medicine. Warfarin needs to be taken exactly as told. °¨ Taking too much or too little warfarin is dangerous. Blood tests must be done as often as told by your doctor. A PT blood test measures how long it takes for blood to clot. Your PT is used to calculate another value called an INR. Your PT and INR help your doctor adjust your warfarin dosage. He or she will make sure you are taking the right amount. °¨ Food can cause problems with warfarin and affect the results of your blood tests. This is true for foods high in vitamin K. Eat the same amount of foods high in vitamin K each day. Foods high in vitamin K include spinach, kale, broccoli, cabbage, collard and turnip greens, Brussels sprouts, peas, cauliflower, seaweed, and parsley. Other foods high in vitamin K include beef and pork liver, green tea, and soybean oil. Eat the same amount of foods high in vitamin K each day. Avoid big changes in your diet. Tell your doctor before changing your diet. Talk to a food specialist (dietitian) if you have questions. °¨ Many medicines can cause problems with warfarin and affect your PT and INR. Tell your doctor about all medicines you take. This includes vitamins and dietary pills (supplements). Do not take or stop taking any prescribed or over-the-counter medicines unless your doctor tells you to. °¨ Warfarin can cause more bruising or bleeding. Hold pressure over any cuts for longer than normal. Talk to your doctor about other  side effects of warfarin. °¨ Avoid sports or activities that may cause injury or bleeding. °¨ Be careful when you shave, floss, or use sharp objects. °¨ Avoid or drink very little alcohol while taking warfarin. Tell your doctor if you change how much alcohol you drink. °¨ Tell your dentist and other doctors that you take warfarin before any procedures. °· Follow your diet program as told, if you are given one. °· Keep a healthy weight. °· Stay active. Try to get at least 30 minutes of activity on all or most days. °· Do not use any tobacco products, including cigarettes, chewing tobacco, or electronic cigarettes. If you need help quitting, ask your doctor. °· Limit alcohol intake to no more than 1 drink per day for nonpregnant women and 2 drinks per day for men. One drink equals 12 ounces of beer, 5 ounces of wine, or 1½ ounces of hard liquor. °· Do not abuse drugs. °· Keep your home safe so you do not fall. You can do this by: °¨ Putting grab bars in the bedroom and bathroom. °¨ Raising toilet seats. °¨ Putting a seat in the shower. °· Keep all follow-up visits as told by your doctor. This is important. °Contact a doctor if: °· Your personality changes. °· You have trouble swallowing. °· You have double vision. °· You are dizzy. °· You have a fever. °Get help right away if: °These symptoms may be an emergency. Do not wait to see if   the symptoms will go away. Get medical help right away. Call your local emergency services (911 in the [place] Do not drive yourself to the hospital. °· You have sudden weakness or lose feeling (go numb), especially on one side of the body. This can affect your: °¨ Face. °¨ Arm. °¨ Leg. °· You have sudden trouble walking. °· You have sudden trouble moving your arms or legs. °· You have sudden confusion. °· You have trouble talking. °· You have trouble understanding. °· You have sudden trouble seeing in one or both eyes. °· You lose your balance. °· Your movements are not smooth. °· You  have a sudden, very bad headache with no known cause. °· You have new chest pain. °· Your heartbeat is unsteady. °· You are partly or totally unaware of what is going on around you. °This information is not intended to replace advice given to you by your health care provider. Make sure you discuss any questions you have with your health care provider. °Document Released: 05/18/2008 Document Revised: 04/12/2016 Document Reviewed: 11/14/2013 °Elsevier Interactive Patient Education © 2017 [employer] ° °

## 2016-12-23 NOTE — Evaluation (Signed)
Occupational Therapy Evaluation and Discharge Patient Details Name: Katherine Carrillo MRN: [id number] DOB: 27-May-1946 Today's Date: 12/23/2016    History of Present Illness 32-yo F with PMH of Anemia; Cervical cancer; Hypertension; Peripheral vascular disease; and Stroke who presented to [facility] ED on 12/22/16 with difficulty speaking. Hx of of stroke in 2011 with largely resolved R hemiparesis. Pt states prev stroke affected memory and expressive abilities. MRI revealed no new infarcts, just Old small cerebellar infarcts and mild chronic small vessel ischemic disease.   Clinical Impression   PTA Pt independent in ADL and mobility. Pt currently supervision for ADL and mobility. Please see OT problem list below. Pt and daughter report that ADL performance is at baseline and have no questions or concerns for OT from self-care perspective. OT spent significant time talking about safety and DME, and assessing vision. Pt and daughter declined at this time. OT to sign off at this point, if anything changes please re-order OT. Thank you for this referral.    Follow Up Recommendations  No OT follow up;Supervision - Intermittent    Equipment Recommendations  None recommended by OT (Pt declined 3 in 1 or other DME)    Recommendations for Other Services       Precautions / Restrictions Precautions Precautions: Fall Restrictions Weight Bearing Restrictions: No      Mobility Bed Mobility Overal bed mobility: Modified Independent             General bed mobility comments: Extra time required to get back in bed  Transfers Overall transfer level: Modified independent Equipment used: None             General transfer comment: Pt used rail and required extra time for power up. No physical assist required.  Upon return to chair after ambulation, pt states she felt dizzy.  Pt states she feels dizzy from standing up too fast sometimes.    Balance Overall balance assessment:  Needs assistance Sitting-balance support: No upper extremity supported;Feet supported Sitting balance-Leahy Scale: Good     Standing balance support: No upper extremity supported Standing balance-Leahy Scale: Good                             ADL either performed or assessed with clinical judgement   ADL Overall ADL's : At baseline                                       General ADL Comments: Pt able to perform toilet transfer and peri care, sink level grooming, and don/doff sock from sitting     Vision Baseline Vision/History: Wears glasses Wears Glasses: At all times Patient Visual Report: No change from baseline Vision Assessment?: Yes Eye Alignment: Within Functional Limits Ocular Range of Motion: Within Functional Limits Alignment/Gaze Preference: Within Defined Limits Tracking/Visual Pursuits: Able to track stimulus in all quads without difficulty Convergence: Within functional limits Visual Fields: No apparent deficits     Perception     Praxis      Pertinent Vitals/Pain Pain Assessment: Faces Faces Pain Scale: Hurts a little bit Pain Location: Back of thighs Pain Descriptors / Indicators: Sore Pain Intervention(s): Limited activity within patient's tolerance;Monitored during session;Repositioned     Hand Dominance Right   Extremity/Trunk Assessment Upper Extremity Assessment Upper Extremity Assessment: RUE deficits/detail RUE Deficits / Details: Baseline weakness from previous stroke  Lower Extremity Assessment Lower Extremity Assessment: Defer to PT evaluation   Cervical / Trunk Assessment Cervical / Trunk Assessment: Other exceptions (forward head)   Communication Communication Communication: Expressive difficulties (Slow speech requiring extra time for word finding)   Cognition Arousal/Alertness: Awake/alert Behavior During Therapy: WFL for tasks assessed/performed Overall Cognitive Status: Within Functional Limits for  tasks assessed                                     General Comments  Pt states she walks 3x/week with daughter and has recently joined a senior center with exercise classes like [place] [employer].    Exercises     Shoulder Instructions      Home Living Family/patient expects to be discharged to:: Private residence Living Arrangements: Children (Daughter) Available Help at Discharge: Family;Available PRN/intermittently Type of Home: House Home Access: Stairs to enter [employer] of Steps: 4 Entrance Stairs-Rails: Left Home Layout: Multi-level (bedroom and toilet on main, shower on upper) Alternate Level Stairs-Number of Steps: 5 Alternate Level Stairs-Rails: Left Bathroom Shower/Tub: Teacher, early years/pre: Standard     Home Equipment: Cane - single point   Additional Comments: uses cane when she feels "lightheaded"      Prior Functioning/Environment Level of Independence: Independent        Comments: Daughter helps with cooking/cleaning        OT Problem List: Decreased activity tolerance;Decreased safety awareness      OT Treatment/Interventions:      OT Goals(Current goals can be found in the care plan section) Acute Rehab OT Goals Patient Stated Goal: to get home OT Goal Formulation: With patient Time For Goal Achievement: 01/06/17 Potential to Achieve Goals: Good  OT Frequency:     Barriers to D/C:            Co-evaluation              AM-PAC PT "6 Clicks" Daily Activity     Outcome Measure Help from another person eating meals?: None Help from another person taking care of personal grooming?: None Help from another person toileting, which includes using toliet, bedpan, or urinal?: None Help from another person bathing (including washing, rinsing, drying)?: A Little Help from another person to put on and taking off regular upper body clothing?: None Help from another person to put on and taking off regular lower  body clothing?: None 6 Click Score: 23   End of Session Nurse Communication: Mobility status  Activity Tolerance: Patient tolerated treatment well Patient left: in bed;Other (comment) (with Transport going to a procedure)  OT Visit Diagnosis: Unsteadiness on feet (R26.81);Other symptoms and signs involving the nervous system (R29.898)                Time: 8657-8469 OT Time Calculation (min): 14 min Charges:  OT General Charges $OT Visit: 1 Procedure OT Evaluation $OT Eval Low Complexity: 1 Procedure G-Codes: OT G-codes **NOT FOR INPATIENT CLASS** Functional Assessment Tool Used: AM-PAC 6 Clicks Daily Activity Functional Limitation: Self care Self Care Current Status (G2952): At least 1 percent but less than 20 percent impaired, limited or restricted Self Care Goal Status (W4132): At least 1 percent but less than 20 percent impaired, limited or restricted Self Care Discharge Status [phone]): At least 1 percent but less than 20 percent impaired, limited or restricted   Hulda Humphrey OTR/L [place] 12/23/2016,  3:30 PM

## 2016-12-24 LAB — VAS US CAROTID
LEFT ECA DIAS: -3 cm/s
LEFT VERTEBRAL DIAS: 11 cm/s
LICAPSYS: 39 cm/s
Left CCA dist dias: -18 cm/s
Left CCA dist sys: -59 cm/s
Left CCA prox dias: 12 cm/s
Left CCA prox sys: 100 cm/s
Left ICA dist dias: -18 cm/s
Left ICA dist sys: -68 cm/s
Left ICA prox dias: 9 cm/s
RIGHT ECA DIAS: -6 cm/s
RIGHT VERTEBRAL DIAS: 9 cm/s
Right CCA prox dias: 9 cm/s
Right CCA prox sys: 73 cm/s
Right cca dist sys: -60 cm/s

## 2018-02-16 ENCOUNTER — Other Ambulatory Visit: Payer: Self-pay

## 2018-02-16 ENCOUNTER — Emergency Department (HOSPITAL_BASED_OUTPATIENT_CLINIC_OR_DEPARTMENT_OTHER)
Admission: EM | Admit: 2018-02-16 | Discharge: 2018-02-16 | Disposition: A | Discharge status: Home / Self Care | Payer: Medicare Other | Attending: Emergency Medicine | Admitting: Emergency Medicine

## 2018-02-16 ENCOUNTER — Emergency Department (HOSPITAL_BASED_OUTPATIENT_CLINIC_OR_DEPARTMENT_OTHER): Payer: Medicare Other

## 2018-02-16 ENCOUNTER — Encounter (HOSPITAL_BASED_OUTPATIENT_CLINIC_OR_DEPARTMENT_OTHER): Payer: Self-pay | Admitting: Emergency Medicine

## 2018-02-16 DIAGNOSIS — R350 Frequency of micturition: Secondary | ICD-10-CM | POA: Diagnosis not present

## 2018-02-16 DIAGNOSIS — I10 Essential (primary) hypertension: Secondary | ICD-10-CM | POA: Insufficient documentation

## 2018-02-16 DIAGNOSIS — Z79899 Other long term (current) drug therapy: Secondary | ICD-10-CM | POA: Diagnosis not present

## 2018-02-16 DIAGNOSIS — Z8673 Personal history of transient ischemic attack (TIA), and cerebral infarction without residual deficits: Secondary | ICD-10-CM | POA: Insufficient documentation

## 2018-02-16 DIAGNOSIS — Z8541 Personal history of malignant neoplasm of cervix uteri: Secondary | ICD-10-CM | POA: Insufficient documentation

## 2018-02-16 DIAGNOSIS — R51 Headache: Secondary | ICD-10-CM | POA: Diagnosis not present

## 2018-02-16 DIAGNOSIS — N3000 Acute cystitis without hematuria: Secondary | ICD-10-CM | POA: Insufficient documentation

## 2018-02-16 DIAGNOSIS — R519 Headache, unspecified: Secondary | ICD-10-CM

## 2018-02-16 DIAGNOSIS — Z7902 Long term (current) use of antithrombotics/antiplatelets: Secondary | ICD-10-CM | POA: Diagnosis not present

## 2018-02-16 LAB — URINALYSIS, ROUTINE W REFLEX MICROSCOPIC
Bilirubin Urine: NEGATIVE
GLUCOSE, UA: NEGATIVE mg/dL
HGB URINE DIPSTICK: NEGATIVE
Ketones, ur: NEGATIVE mg/dL
Nitrite: NEGATIVE
PROTEIN: NEGATIVE mg/dL
pH: 5.5 (ref 5.0–8.0)

## 2018-02-16 LAB — URINALYSIS, MICROSCOPIC (REFLEX)

## 2018-02-16 MED ORDER — CEPHALEXIN 500 MG PO CAPS: 500.0000 mg | ORAL_CAPSULE | Freq: Three times a day (TID) | ORAL | 0 refills | Status: DC

## 2018-02-16 NOTE — ED Provider Notes (Signed)
[place] EMERGENCY DEPARTMENT Provider Note   CSN: [id number] Arrival date & time: 02/16/18  0126     History   Chief Complaint Chief Complaint  Patient presents with  . Recurrent UTI    HPI Katherine Carrillo is a 72 y.o. female.  HPI 72 year old female comes into the ER with chief complaint of urinary frequency and intermittent headaches. Patient has history of peripheral vascular disease, strokes, hypertension and remote history of cervical cancer.  Patient states that over the past 3 days she has been having urinary frequency, which has gotten worse today.  She also notes subjective chills without fevers and generalized weakness starting today.  On review of system, patient states that she has been having intermittent headaches for the last several weeks.  Headaches typically are left-sided, around the temple.  Pain is moderately severe and there is no associated numbness, tingling, vision changes, dizziness, focal weakness.   Past Medical History:  Diagnosis Date  . Anemia   . Cervical cancer ([place])   . Hypertension   . Peripheral vascular disease ([place])   . Stroke [facility])     Patient Active Problem List   Diagnosis Date Noted  . TIA (transient ischemic attack) 12/22/2016  . Dysarthria 12/22/2016  . Essential hypertension 12/22/2016  . History of stroke 12/22/2016  . PVD (peripheral vascular disease) ([place]) 12/22/2016    Past Surgical History:  Procedure Laterality Date  . ABDOMINAL HYSTERECTOMY    . CESAREAN SECTION    . FEMORAL ARTERY - FEMORAL ARTERY BYPASS GRAFT    . TONSILLECTOMY       OB History   None      Home Medications    Prior to Admission medications   Medication Sig Start Date End Date Taking? Authorizing Provider  carvedilol (COREG) 12.5 MG tablet Take 12.5 mg by mouth 2 (two) times daily with a meal.   Yes [provider]  clopidogrel (PLAVIX) 75 MG tablet Take 1 tablet (75 mg total) by mouth daily. 12/24/16  Yes Bonnielee Haff, MD  ferrous sulfate 325 (65 FE) MG EC tablet Take 325 mg by mouth daily. 12/12/16  Yes [provider]  lisinopril (PRINIVIL,ZESTRIL) 10 MG tablet Take 10 mg by mouth daily. 09/25/16  Yes [provider]  Vitamin D, Ergocalciferol, (DRISDOL) 50000 UNITS CAPS capsule Take 50,000 Units by mouth every 7 (seven) days.   Yes [provider]  cephALEXin (KEFLEX) 500 MG capsule Take 1 capsule (500 mg total) by mouth 3 (three) times daily. 02/16/18   Varney Biles, MD    Family History Family History  Problem Relation Age of Onset  . Diabetes Sister   . Diabetes Brother     Social History Social History   Tobacco Use  . Smoking status: Never Smoker  . Smokeless tobacco: Never Used  Substance Use Topics  . Alcohol use: No  . Drug use: No     Allergies   Caffeine and Codeine   Review of Systems Review of Systems  Constitutional: Positive for activity change and chills.  Eyes: Negative for visual disturbance.  Gastrointestinal: Negative for abdominal pain, nausea and vomiting.  Neurological: Positive for headaches.  All other systems reviewed and are negative.    Physical Exam Updated Vital Signs BP (!) 170/80   Pulse 68   Temp 98.3 F (36.8 C) (Oral)   Resp 18   Ht 5\' 1"  (1.549 m)   Wt 86.2 kg (190 lb)   SpO2 100%   BMI  35.90 kg/m   Physical Exam  Constitutional: She is oriented to person, place, and time. She appears well-developed.  HENT:  Head: Normocephalic and atraumatic.  Eyes: Pupils are equal, round, and reactive to light. EOM are normal.  Neck: Normal range of motion. Neck supple.  Cardiovascular: Normal rate.  Pulmonary/Chest: Effort normal.  Abdominal: Soft. Bowel sounds are normal. There is no tenderness.  Neurological: She is alert and oriented to person, place, and time. No cranial nerve deficit. Coordination normal.  Skin: Skin is warm and dry.  Nursing note and vitals reviewed.    ED Treatments / Results   Labs (all labs ordered are listed, but only abnormal results are displayed) Labs Reviewed  URINALYSIS, ROUTINE W REFLEX MICROSCOPIC - Abnormal; Notable for the following components:      Result Value   APPearance HAZY (*)    Specific Gravity, Urine <1.005 (*)    Leukocytes, UA SMALL (*)    All other components within normal limits  URINALYSIS, MICROSCOPIC (REFLEX) - Abnormal; Notable for the following components:   Bacteria, UA FEW (*)    All other components within normal limits    EKG None  Radiology Ct Head Wo Contrast  Result Date: 02/16/2018 CLINICAL DATA:  72 year old female with headache. EXAM: CT HEAD WITHOUT CONTRAST TECHNIQUE: Contiguous axial images were obtained from the base of the skull through the vertex without intravenous contrast. COMPARISON:  Brain MRI dated 12/22/2016 FINDINGS: Brain: There is mild moderate age-related atrophy and chronic microvascular ischemic changes. Small old cerebellar infarcts. There is no acute intracranial hemorrhage. No mass effect or midline shift. No extra-axial fluid collection. Vascular: No hyperdense vessel or unexpected calcification. Skull: Normal. Negative for fracture or focal lesion. Sinuses/Orbits: No acute finding. Other: None IMPRESSION: 1. No acute intracranial pathology. 2. Age-related atrophy and chronic microvascular ischemic changes. Electronically Signed   By: Anner Crete M.D.   On: 02/16/2018 03:37    Procedures Procedures (including critical care time)  Medications Ordered in ED Medications - No data to display   Initial Impression / Assessment and Plan / ED Course  I have reviewed the triage vital signs and the nursing notes.  Pertinent labs & imaging results that were available during my care of the patient were reviewed by me and considered in my medical decision making (see chart for details).    72 year old female comes in with chief complaint of urinary frequency.  She has no abdominal tenderness or  back pain associated with it.  Patient has history of UTI, and states that she has had UTI with just urinary frequency in the past.  UA does not show any convincing findings of UTI, however in the setting of patient stating that she has had UTI with urinary frequency in the past, we will start her on antibiotics.  No need for urine culture however as this is not a complicated UTI.  Additionally, patient complains of headaches.  Patient has history of cervical cancer and her current headache is on the left temporal region.  Headaches have been intermittent, currently she has a mild headache that has been going on for a few hours.  Patient has no focal neurologic symptoms or visual disturbance associated with this.  CT scan of the head has been ordered. Patient is declined sed rate for now, because she does not want a study that will take 3 hours to result.  Patient made aware that temporal arteritis can be unilateral left-sided headache, and if present, it can lead to  permanent blindness.  Patient will see her PCP as soon as possible for further evaluation of the headaches continue.   Final Clinical Impressions(s) / ED Diagnoses   Final diagnoses:  Left temporal headache  Acute cystitis without hematuria  Urinary frequency    ED Discharge Orders        Ordered    cephALEXin (KEFLEX) 500 MG capsule  3 times daily,   Status:  Discontinued     02/16/18 0344    cephALEXin (KEFLEX) 500 MG capsule  3 times daily     02/16/18 0346       Varney Biles, MD 02/16/18 [phone]

## 2018-02-16 NOTE — ED Triage Notes (Signed)
Pt c/o urinary frequency for the past 3 days getting worse with some chills, and incontinence episodes.

## 2018-02-16 NOTE — Discharge Instructions (Signed)
He signed the ER for urinary frequency, and are starting you on antibiotics for that. He had also complained of left-sided headaches, and the CT scan of your head is normal.  However, as discussed it is prudent that he follow-up with your primary care doctor for further evaluation of the left-sided headache if they continue.  As discussed, there are some critical conditions that can present with one-sided headache that can lead to permanent damage.

## 2018-03-25 ENCOUNTER — Observation Stay (HOSPITAL_BASED_OUTPATIENT_CLINIC_OR_DEPARTMENT_OTHER)
Admission: EM | Admit: 2018-03-25 | Discharge: 2018-03-28 | Disposition: A | Discharge status: Home / Self Care | Payer: Medicare Other | Attending: Internal Medicine | Admitting: Internal Medicine

## 2018-03-25 ENCOUNTER — Other Ambulatory Visit: Payer: Self-pay

## 2018-03-25 ENCOUNTER — Encounter (HOSPITAL_BASED_OUTPATIENT_CLINIC_OR_DEPARTMENT_OTHER): Payer: Self-pay | Admitting: *Deleted

## 2018-03-25 ENCOUNTER — Emergency Department (HOSPITAL_BASED_OUTPATIENT_CLINIC_OR_DEPARTMENT_OTHER): Payer: Medicare Other

## 2018-03-25 DIAGNOSIS — R079 Chest pain, unspecified: Principal | ICD-10-CM | POA: Insufficient documentation

## 2018-03-25 DIAGNOSIS — E782 Mixed hyperlipidemia: Secondary | ICD-10-CM | POA: Diagnosis present

## 2018-03-25 DIAGNOSIS — N289 Disorder of kidney and ureter, unspecified: Secondary | ICD-10-CM | POA: Diagnosis not present

## 2018-03-25 DIAGNOSIS — R778 Other specified abnormalities of plasma proteins: Secondary | ICD-10-CM

## 2018-03-25 DIAGNOSIS — I214 Non-ST elevation (NSTEMI) myocardial infarction: Secondary | ICD-10-CM | POA: Insufficient documentation

## 2018-03-25 DIAGNOSIS — E669 Obesity, unspecified: Secondary | ICD-10-CM | POA: Diagnosis not present

## 2018-03-25 DIAGNOSIS — Z8541 Personal history of malignant neoplasm of cervix uteri: Secondary | ICD-10-CM | POA: Insufficient documentation

## 2018-03-25 DIAGNOSIS — Z7901 Long term (current) use of anticoagulants: Secondary | ICD-10-CM | POA: Insufficient documentation

## 2018-03-25 DIAGNOSIS — R911 Solitary pulmonary nodule: Secondary | ICD-10-CM | POA: Diagnosis not present

## 2018-03-25 DIAGNOSIS — I110 Hypertensive heart disease with heart failure: Secondary | ICD-10-CM | POA: Diagnosis not present

## 2018-03-25 DIAGNOSIS — Z90710 Acquired absence of both cervix and uterus: Secondary | ICD-10-CM | POA: Insufficient documentation

## 2018-03-25 DIAGNOSIS — E785 Hyperlipidemia, unspecified: Secondary | ICD-10-CM | POA: Insufficient documentation

## 2018-03-25 DIAGNOSIS — I080 Rheumatic disorders of both mitral and aortic valves: Secondary | ICD-10-CM | POA: Insufficient documentation

## 2018-03-25 DIAGNOSIS — I739 Peripheral vascular disease, unspecified: Secondary | ICD-10-CM | POA: Insufficient documentation

## 2018-03-25 DIAGNOSIS — D649 Anemia, unspecified: Secondary | ICD-10-CM | POA: Insufficient documentation

## 2018-03-25 DIAGNOSIS — Z8673 Personal history of transient ischemic attack (TIA), and cerebral infarction without residual deficits: Secondary | ICD-10-CM | POA: Diagnosis not present

## 2018-03-25 DIAGNOSIS — I700 Atherosclerosis of aorta: Secondary | ICD-10-CM | POA: Diagnosis present

## 2018-03-25 DIAGNOSIS — Z6837 Body mass index (BMI) 37.0-37.9, adult: Secondary | ICD-10-CM | POA: Diagnosis not present

## 2018-03-25 DIAGNOSIS — R7989 Other specified abnormal findings of blood chemistry: Secondary | ICD-10-CM | POA: Diagnosis not present

## 2018-03-25 DIAGNOSIS — Z885 Allergy status to narcotic agent status: Secondary | ICD-10-CM | POA: Diagnosis not present

## 2018-03-25 DIAGNOSIS — I5030 Unspecified diastolic (congestive) heart failure: Secondary | ICD-10-CM | POA: Diagnosis not present

## 2018-03-25 DIAGNOSIS — Z951 Presence of aortocoronary bypass graft: Secondary | ICD-10-CM | POA: Diagnosis not present

## 2018-03-25 DIAGNOSIS — R748 Abnormal levels of other serum enzymes: Secondary | ICD-10-CM | POA: Diagnosis present

## 2018-03-25 DIAGNOSIS — Z9889 Other specified postprocedural states: Secondary | ICD-10-CM | POA: Diagnosis not present

## 2018-03-25 DIAGNOSIS — Z91018 Allergy to other foods: Secondary | ICD-10-CM | POA: Insufficient documentation

## 2018-03-25 DIAGNOSIS — M6281 Muscle weakness (generalized): Secondary | ICD-10-CM | POA: Diagnosis not present

## 2018-03-25 DIAGNOSIS — Z79899 Other long term (current) drug therapy: Secondary | ICD-10-CM | POA: Insufficient documentation

## 2018-03-25 DIAGNOSIS — K449 Diaphragmatic hernia without obstruction or gangrene: Secondary | ICD-10-CM | POA: Insufficient documentation

## 2018-03-25 LAB — CBC WITH DIFFERENTIAL/PLATELET
Basophils Absolute: 0 10*3/uL (ref 0.0–0.1)
Basophils Relative: 0 %
EOS PCT: 0 %
Eosinophils Absolute: 0 10*3/uL (ref 0.0–0.7)
HCT: 35.3 % — ABNORMAL LOW (ref 36.0–46.0)
Hemoglobin: 11.6 g/dL — ABNORMAL LOW (ref 12.0–15.0)
LYMPHS ABS: 0.7 10*3/uL (ref 0.7–4.0)
LYMPHS PCT: 10 %
MCH: 31.7 pg (ref 26.0–34.0)
MCHC: 32.9 g/dL (ref 30.0–36.0)
MCV: 96.4 fL (ref 78.0–100.0)
MONO ABS: 0.6 10*3/uL (ref 0.1–1.0)
MONOS PCT: 8 %
Neutro Abs: 5.8 10*3/uL (ref 1.7–7.7)
Neutrophils Relative %: 82 %
PLATELETS: 155 10*3/uL (ref 150–400)
RBC: 3.66 MIL/uL — ABNORMAL LOW (ref 3.87–5.11)
RDW: 12.3 % (ref 11.5–15.5)
WBC: 7.2 10*3/uL (ref 4.0–10.5)

## 2018-03-25 LAB — COMPREHENSIVE METABOLIC PANEL
ALT: 19 U/L (ref 0–44)
ANION GAP: 7 (ref 5–15)
AST: 30 U/L (ref 15–41)
Albumin: 3.7 g/dL (ref 3.5–5.0)
Alkaline Phosphatase: 50 U/L (ref 38–126)
BUN: 17 mg/dL (ref 8–23)
CHLORIDE: 103 mmol/L (ref 98–111)
CO2: 27 mmol/L (ref 22–32)
CREATININE: 1.11 mg/dL — AB (ref 0.44–1.00)
Calcium: 8.7 mg/dL — ABNORMAL LOW (ref 8.9–10.3)
GFR, EST AFRICAN AMERICAN: 56 mL/min — AB (ref >60)
GFR, EST NON AFRICAN AMERICAN: 48 mL/min — AB (ref >60)
Glucose, Bld: 119 mg/dL — ABNORMAL HIGH (ref 70–99)
POTASSIUM: 4.7 mmol/L (ref 3.5–5.1)
Sodium: 137 mmol/L (ref 135–145)
Total Bilirubin: 0.5 mg/dL (ref 0.3–1.2)
Total Protein: 7.2 g/dL (ref 6.5–8.1)

## 2018-03-25 LAB — D-DIMER, QUANTITATIVE (NOT AT ARMC): D DIMER QUANT: 1.22 ug{FEU}/mL — AB (ref 0.00–0.50)

## 2018-03-25 LAB — TROPONIN I: TROPONIN I: 0.08 ng/mL — AB (ref <0.03)

## 2018-03-25 MED ORDER — IOPAMIDOL (ISOVUE-370) INJECTION 76%
100.0000 mL | Freq: Once | INTRAVENOUS | Status: AC | PRN
  Administered 2018-03-25: 81 mL via INTRAVENOUS

## 2018-03-25 MED ORDER — SODIUM CHLORIDE 0.9 % IV BOLUS
1000.0000 mL | Freq: Once | INTRAVENOUS | Status: AC
  Administered 2018-03-25: 1000 mL via INTRAVENOUS

## 2018-03-25 NOTE — ED Triage Notes (Signed)
Per EMS report: pt had panic attack at restaurant. Sx resolving. VS: 130/78 HR 100 RR 20. Pt calm and alert

## 2018-03-25 NOTE — Progress Notes (Signed)
72 yo female with h/o CVA, PVD, Hypertension c/o chest pain starting earlier today.   Trop 0.08 EKG NSR at 75, nl axis, nl int, no st-t changes suggestive of ischemia  D dimer positive, CTA chest pending  ED spoke with cardiology due to elevated trop,  Cardiology recommended trending troponins and to call them only if trending up according to ED? Cardiology did not recommend heparin per ED?  ED requesting admission for chest pain and positive trop

## 2018-03-25 NOTE — ED Notes (Signed)
Pt states she sneezed and she felt like her heart was racing around noon today. Pt then felt bad all afternoon. The symptoms slowly became worse while she was in the restaurant her legs became weak, and she felt tingling in her hands.

## 2018-03-25 NOTE — ED Provider Notes (Signed)
[place] EMERGENCY DEPARTMENT Provider Note   CSN: [id number] Arrival date & time: 03/25/18  2115     History   Chief Complaint Chief Complaint  Patient presents with  . Panic Attack    HPI Katherine Carrillo is a 72 y.o. female hx of HTN, stroke on Plavix here presenting with chest pain, palpitations.  Patient states that she had palpitations starting around noontime.  She had intermittent chest pressure associated with that.  She states that she felt anxious because she was not sure what was going on.  She went to a restaurant with her daughter and had acute onset of worsening palpitations.  She felt some pressure in her chest at that time and daughter noted that she was diaphoretic. No previous hx of CAD or PE. No recent travels   The history is provided by the patient.    Past Medical History:  Diagnosis Date  . Anemia   . Cervical cancer ([place])   . Hypertension   . Peripheral vascular disease ([place])   . Stroke [facility])     Patient Active Problem List   Diagnosis Date Noted  . TIA (transient ischemic attack) 12/22/2016  . Dysarthria 12/22/2016  . Essential hypertension 12/22/2016  . History of stroke 12/22/2016  . PVD (peripheral vascular disease) ([place]) 12/22/2016    Past Surgical History:  Procedure Laterality Date  . ABDOMINAL HYSTERECTOMY    . CESAREAN SECTION    . FEMORAL ARTERY - FEMORAL ARTERY BYPASS GRAFT    . TONSILLECTOMY       OB History   None      Home Medications    Prior to Admission medications   Medication Sig Start Date End Date Taking? Authorizing Provider  carvedilol (COREG) 12.5 MG tablet Take 12.5 mg by mouth 2 (two) times daily with a meal.   Yes [provider]  clopidogrel (PLAVIX) 75 MG tablet Take 1 tablet (75 mg total) by mouth daily. 12/24/16  Yes Bonnielee Haff, MD  ferrous sulfate 325 (65 FE) MG EC tablet Take 325 mg by mouth daily. 12/12/16  Yes [provider]  lisinopril (PRINIVIL,ZESTRIL) 10 MG tablet  Take 10 mg by mouth daily. 09/25/16  Yes [provider]  Vitamin D, Ergocalciferol, (DRISDOL) 50000 UNITS CAPS capsule Take 50,000 Units by mouth every 7 (seven) days.   Yes [provider]  cephALEXin (KEFLEX) 500 MG capsule Take 1 capsule (500 mg total) by mouth 3 (three) times daily. 02/16/18   Varney Biles, MD    Family History Family History  Problem Relation Age of Onset  . Diabetes Sister   . Diabetes Brother     Social History Social History   Tobacco Use  . Smoking status: Never Smoker  . Smokeless tobacco: Never Used  Substance Use Topics  . Alcohol use: No  . Drug use: No     Allergies   Caffeine and Codeine   Review of Systems Review of Systems  Cardiovascular: Positive for chest pain.  All other systems reviewed and are negative.    Physical Exam Updated Vital Signs BP (!) 175/84   Pulse 77   Temp 98.2 F (36.8 C) (Oral)   Resp (!) 26   Ht 5\' 1"  (1.549 m)   Wt 86.2 kg (190 lb)   SpO2 100%   BMI 35.90 kg/m   Physical Exam  Constitutional: She is oriented to person, place, and time. She appears well-developed.  HENT:  Head: Normocephalic.  Mouth/Throat: Oropharynx is clear  and moist.  Eyes: Pupils are equal, round, and reactive to light. EOM are normal.  Neck: Normal range of motion. Neck supple.  Cardiovascular: Normal rate, regular rhythm and normal heart sounds.  Pulmonary/Chest: Effort normal and breath sounds normal. No stridor. No respiratory distress.  Abdominal: Soft. Bowel sounds are normal. She exhibits no distension. There is no tenderness.  Musculoskeletal: Normal range of motion. She exhibits no edema or tenderness.  Neurological: She is alert and oriented to person, place, and time. No cranial nerve deficit. Coordination normal.  Skin: Skin is warm.  Psychiatric: She has a normal mood and affect.  Nursing note and vitals reviewed.    ED Treatments / Results  Labs (all labs ordered are listed, but only  abnormal results are displayed) Labs Reviewed  CBC WITH DIFFERENTIAL/PLATELET - Abnormal; Notable for the following components:      Result Value   RBC 3.66 (*)    Hemoglobin 11.6 (*)    HCT 35.3 (*)    All other components within normal limits  COMPREHENSIVE METABOLIC PANEL - Abnormal; Notable for the following components:   Glucose, Bld 119 (*)    Creatinine, Ser 1.11 (*)    Calcium 8.7 (*)    GFR calc non Af Amer 48 (*)    GFR calc Af Amer 56 (*)    All other components within normal limits  TROPONIN I - Abnormal; Notable for the following components:   Troponin I 0.08 (*)    All other components within normal limits  D-DIMER, QUANTITATIVE (NOT AT [facility]) - Abnormal; Notable for the following components:   D-Dimer, Quant 1.22 (*)    All other components within normal limits    EKG EKG Interpretation  Date/Time:  Saturday March 25 2018 22:25:28 EDT Ventricular Rate:  75 PR Interval:    QRS Duration: 81 QT Interval:  378 QTC Calculation: 423 R Axis:     Text Interpretation:  Sinus rhythm Left ventricular hypertrophy Baseline wander in lead(s) II III aVR aVF V6 No significant change since last tracing Confirmed by Wandra Arthurs [phone]) on 03/25/2018 10:30:25 PM   Radiology Dg Chest 2 View  Result Date: 03/25/2018 CLINICAL DATA:  Bilateral leg weakness. EXAM: CHEST - 2 VIEW COMPARISON:  None. FINDINGS: The lungs are clear without focal pneumonia, edema, pneumothorax or pleural effusion. The cardiopericardial silhouette is within normal limits for size. Hiatal hernia noted. The visualized bony structures of the thorax are intact. Telemetry leads overlie the chest. IMPRESSION: No active cardiopulmonary disease. Electronically Signed   By: Misty Stanley M.D.   On: 03/25/2018 23:34    Procedures Procedures (including critical care time)  CRITICAL CARE Performed by: Wandra Arthurs   Total critical care time: 30 minutes  Critical care time was exclusive of separately billable  procedures and treating other patients.  Critical care was necessary to treat or prevent imminent or life-threatening deterioration.  Critical care was time spent personally by me on the following activities: development of treatment plan with patient and/or surrogate as well as nursing, discussions with consultants, evaluation of patient's response to treatment, examination of patient, obtaining history from patient or surrogate, ordering and performing treatments and interventions, ordering and review of laboratory studies, ordering and review of radiographic studies, pulse oximetry and re-evaluation of patient's condition.   Medications Ordered in ED Medications  sodium chloride 0.9 % bolus 1,000 mL (0 mLs Intravenous Stopped 03/26/18 0003)  iopamidol (ISOVUE-370) 76 % injection 100 mL (81 mLs Intravenous Contrast Given  03/25/18 2330)     Initial Impression / Assessment and Plan / ED Course  I have reviewed the triage vital signs and the nursing notes.  Pertinent labs & imaging results that were available during my care of the patient were reviewed by me and considered in my medical decision making (see chart for details).     Katherine Carrillo is a 72 y.o. female here with chest pain, palpitations. Consider PE or ACS. Will get labs, CTA chest, trop x 2.   12:14 AM Trop positive at 0.08. I am concerned for unstable angina or NSTEMI. I called Dr. Rhae Hammock from cardiology. Since patient has positive d-dimer and no EKG changes, he recommend trending troponins and hospitalist admission. Dr. Maudie Mercury to admit. Started on heparin given concern for unstable angina.   Final Clinical Impressions(s) / ED Diagnoses   Final diagnoses:  Elevated troponin    ED Discharge Orders    None       Drenda Freeze, MD 03/26/18 [phone]

## 2018-03-25 NOTE — ED Notes (Signed)
Dr Darl Householder aware that pt's troponin is 0.08.

## 2018-03-25 NOTE — ED Triage Notes (Signed)
Pt reports she had a panic attack while at restaurant with family just pta. States Sx improving. Alert, calm and cooperative

## 2018-03-26 ENCOUNTER — Inpatient Hospital Stay (HOSPITAL_COMMUNITY): Payer: Medicare Other

## 2018-03-26 ENCOUNTER — Encounter (HOSPITAL_COMMUNITY): Payer: Self-pay | Admitting: Family Medicine

## 2018-03-26 DIAGNOSIS — I214 Non-ST elevation (NSTEMI) myocardial infarction: Secondary | ICD-10-CM | POA: Diagnosis not present

## 2018-03-26 DIAGNOSIS — I739 Peripheral vascular disease, unspecified: Secondary | ICD-10-CM

## 2018-03-26 DIAGNOSIS — R079 Chest pain, unspecified: Secondary | ICD-10-CM | POA: Diagnosis not present

## 2018-03-26 DIAGNOSIS — N289 Disorder of kidney and ureter, unspecified: Secondary | ICD-10-CM

## 2018-03-26 DIAGNOSIS — I110 Hypertensive heart disease with heart failure: Secondary | ICD-10-CM | POA: Diagnosis not present

## 2018-03-26 DIAGNOSIS — I700 Atherosclerosis of aorta: Secondary | ICD-10-CM | POA: Diagnosis present

## 2018-03-26 DIAGNOSIS — R748 Abnormal levels of other serum enzymes: Secondary | ICD-10-CM | POA: Diagnosis not present

## 2018-03-26 DIAGNOSIS — R7989 Other specified abnormal findings of blood chemistry: Secondary | ICD-10-CM | POA: Diagnosis present

## 2018-03-26 DIAGNOSIS — I5030 Unspecified diastolic (congestive) heart failure: Secondary | ICD-10-CM | POA: Diagnosis not present

## 2018-03-26 DIAGNOSIS — Z8673 Personal history of transient ischemic attack (TIA), and cerebral infarction without residual deficits: Secondary | ICD-10-CM

## 2018-03-26 DIAGNOSIS — R778 Other specified abnormalities of plasma proteins: Secondary | ICD-10-CM | POA: Diagnosis present

## 2018-03-26 LAB — ECHOCARDIOGRAM COMPLETE
Height: 61 in
Weight: 3169.6 oz

## 2018-03-26 LAB — LIPID PANEL
CHOLESTEROL: 218 mg/dL — AB (ref 0–200)
HDL: 56 mg/dL (ref >40)
LDL Cholesterol: 146 mg/dL — ABNORMAL HIGH (ref 0–99)
TRIGLYCERIDES: 79 mg/dL (ref <150)
Total CHOL/HDL Ratio: 3.9 RATIO
VLDL: 16 mg/dL (ref 0–40)

## 2018-03-26 LAB — TROPONIN I
Troponin I: 0.19 ng/mL (ref <0.03)
Troponin I: 0.2 ng/mL (ref <0.03)
Troponin I: 0.18 ng/mL (ref <0.03)

## 2018-03-26 LAB — CBC
HCT: 31.9 % — ABNORMAL LOW (ref 36.0–46.0)
HEMOGLOBIN: 10.2 g/dL — AB (ref 12.0–15.0)
MCH: 31.8 pg (ref 26.0–34.0)
MCHC: 32 g/dL (ref 30.0–36.0)
MCV: 99.4 fL (ref 78.0–100.0)
Platelets: 116 10*3/uL — ABNORMAL LOW (ref 150–400)
RBC: 3.21 MIL/uL — ABNORMAL LOW (ref 3.87–5.11)
RDW: 12.8 % (ref 11.5–15.5)
WBC: 6.1 10*3/uL (ref 4.0–10.5)

## 2018-03-26 LAB — MRSA PCR SCREENING: MRSA by PCR: NEGATIVE

## 2018-03-26 LAB — HEPARIN LEVEL (UNFRACTIONATED)
HEPARIN UNFRACTIONATED: 0.33 [IU]/mL (ref 0.30–0.70)
Heparin Unfractionated: 0.54 IU/mL (ref 0.30–0.70)

## 2018-03-26 LAB — BASIC METABOLIC PANEL
Anion gap: 12 (ref 5–15)
BUN: 14 mg/dL (ref 8–23)
CO2: 22 mmol/L (ref 22–32)
Calcium: 8.6 mg/dL — ABNORMAL LOW (ref 8.9–10.3)
Chloride: 104 mmol/L (ref 98–111)
Creatinine, Ser: 0.99 mg/dL (ref 0.44–1.00)
GFR calc Af Amer: >60 mL/min (ref >60)
GFR calc non Af Amer: 55 mL/min — ABNORMAL LOW (ref >60)
GLUCOSE: 94 mg/dL (ref 70–99)
POTASSIUM: 5.6 mmol/L — AB (ref 3.5–5.1)
Sodium: 138 mmol/L (ref 135–145)

## 2018-03-26 MED ORDER — ONDANSETRON HCL 4 MG/2ML IJ SOLN: 4.0000 mg | Freq: Four times a day (QID) | INTRAMUSCULAR | Status: DC | PRN

## 2018-03-26 MED ORDER — ASPIRIN EC 81 MG PO TBEC: 81.0000 mg | DELAYED_RELEASE_TABLET | Freq: Every day | ORAL | Status: DC

## 2018-03-26 MED ORDER — ASPIRIN 325 MG PO TABS
325.0000 mg | ORAL_TABLET | Freq: Once | ORAL | Status: AC
  Administered 2018-03-26: 325 mg via ORAL
  Filled 2018-03-26: qty 1

## 2018-03-26 MED ORDER — ATORVASTATIN CALCIUM 40 MG PO TABS: 40.0000 mg | ORAL_TABLET | Freq: Every day | ORAL | Status: DC

## 2018-03-26 MED ORDER — ALPRAZOLAM 0.25 MG PO TABS: 0.2500 mg | ORAL_TABLET | Freq: Two times a day (BID) | ORAL | Status: DC | PRN

## 2018-03-26 MED ORDER — ACETAMINOPHEN 325 MG PO TABS: 650.0000 mg | ORAL_TABLET | ORAL | Status: DC | PRN

## 2018-03-26 MED ORDER — SODIUM CHLORIDE 0.9 % WEIGHT BASED INFUSION: 1.0000 mL/kg/h | INTRAVENOUS | Status: DC

## 2018-03-26 MED ORDER — SODIUM CHLORIDE 0.9% FLUSH: 3.0000 mL | INTRAVENOUS | Status: DC | PRN

## 2018-03-26 MED ORDER — HEPARIN (PORCINE) IN NACL 100-0.45 UNIT/ML-% IJ SOLN
1000.0000 [IU]/h | INTRAMUSCULAR | Status: DC
  Administered 2018-03-26 – 2018-03-27 (×2): 1000 [IU]/h via INTRAVENOUS
  Filled 2018-03-26 (×2): qty 250

## 2018-03-26 MED ORDER — SODIUM CHLORIDE 0.9 % WEIGHT BASED INFUSION: 3.0000 mL/kg/h | INTRAVENOUS | Status: DC

## 2018-03-26 MED ORDER — SODIUM CHLORIDE 0.9 % IV SOLN
INTRAVENOUS | Status: AC
  Administered 2018-03-26: 13:00:00 via INTRAVENOUS

## 2018-03-26 MED ORDER — NITROGLYCERIN 0.4 MG SL SUBL: 0.4000 mg | SUBLINGUAL_TABLET | SUBLINGUAL | Status: DC | PRN

## 2018-03-26 MED ORDER — HEPARIN BOLUS VIA INFUSION
4000.0000 [IU] | Freq: Once | INTRAVENOUS | Status: AC
  Administered 2018-03-26: 4000 [IU] via INTRAVENOUS

## 2018-03-26 MED ORDER — CARVEDILOL 12.5 MG PO TABS
12.5000 mg | ORAL_TABLET | Freq: Two times a day (BID) | ORAL | Status: DC
  Administered 2018-03-26 – 2018-03-28 (×5): 12.5 mg via ORAL
  Filled 2018-03-26 (×5): qty 1

## 2018-03-26 MED ORDER — SODIUM CHLORIDE 0.9 % IV SOLN
INTRAVENOUS | Status: DC | PRN
  Administered 2018-03-26: 500 mL via INTRAVENOUS

## 2018-03-26 MED ORDER — ASPIRIN 81 MG PO CHEW
81.0000 mg | CHEWABLE_TABLET | ORAL | Status: AC
  Administered 2018-03-27: 81 mg via ORAL
  Filled 2018-03-26: qty 1

## 2018-03-26 MED ORDER — LISINOPRIL 10 MG PO TABS
10.0000 mg | ORAL_TABLET | Freq: Every day | ORAL | Status: DC
  Administered 2018-03-26: 10 mg via ORAL
  Filled 2018-03-26: qty 1

## 2018-03-26 MED ORDER — SODIUM CHLORIDE 0.9 % IV SOLN: 250.0000 mL | INTRAVENOUS | Status: DC | PRN

## 2018-03-26 MED ORDER — LISINOPRIL 10 MG PO TABS
10.0000 mg | ORAL_TABLET | Freq: Every day | ORAL | Status: DC
  Administered 2018-03-28: 10 mg via ORAL
  Filled 2018-03-26: qty 1

## 2018-03-26 MED ORDER — CLOPIDOGREL BISULFATE 75 MG PO TABS
75.0000 mg | ORAL_TABLET | Freq: Every day | ORAL | Status: DC
  Administered 2018-03-26 – 2018-03-28 (×3): 75 mg via ORAL
  Filled 2018-03-26 (×3): qty 1

## 2018-03-26 MED ORDER — SODIUM CHLORIDE 0.9% FLUSH
3.0000 mL | Freq: Two times a day (BID) | INTRAVENOUS | Status: DC
  Administered 2018-03-26: 3 mL via INTRAVENOUS

## 2018-03-26 NOTE — ED Notes (Signed)
Contacted [facility])  --  bed request from Dr. Maudie Mercury

## 2018-03-26 NOTE — Progress Notes (Signed)
ANTICOAGULATION CONSULT NOTE - Initial Consult  Pharmacy Consult for Heparin Indication: chest pain/ACS  Allergies  Allergen Reactions  . Caffeine Anxiety  . Codeine Anxiety    Patient Measurements: Height: 5\' 1"  (154.9 cm) Weight: 190 lb (86.2 kg) IBW/kg (Calculated) : 47.8 Heparin Dosing Weight: 70 kg  Vital Signs: Temp: 98.2 F (36.8 C) (08/03 2117) Temp Source: Oral (08/03 2117) BP: 175/84 (08/04 0001) Pulse Rate: 77 (08/04 0001)  Labs: Recent Labs    03/25/18 2221  HGB 11.6*  HCT 35.3*  PLT 155  CREATININE 1.11*  TROPONINI 0.08*    Estimated Creatinine Clearance: 45 mL/min (A) (by C-G formula based on SCr of 1.11 mg/dL (H)).   Medical History: Past Medical History:  Diagnosis Date  . Anemia   . Cervical cancer ([place])   . Hypertension   . Peripheral vascular disease ([place])   . Stroke [facility])     Medications:  No current facility-administered medications on file prior to encounter.    Current Outpatient Medications on File Prior to Encounter  Medication Sig Dispense Refill  . carvedilol (COREG) 12.5 MG tablet Take 12.5 mg by mouth 2 (two) times daily with a meal.    . clopidogrel (PLAVIX) 75 MG tablet Take 1 tablet (75 mg total) by mouth daily. 30 tablet 2  . ferrous sulfate 325 (65 FE) MG EC tablet Take 325 mg by mouth daily.  5  . lisinopril (PRINIVIL,ZESTRIL) 10 MG tablet Take 10 mg by mouth daily.    . Vitamin D, Ergocalciferol, (DRISDOL) 50000 UNITS CAPS capsule Take 50,000 Units by mouth every 7 (seven) days.    . cephALEXin (KEFLEX) 500 MG capsule Take 1 capsule (500 mg total) by mouth 3 (three) times daily. 21 capsule 0     Assessment: 72 y.o. female with chest pain for heparin Goal of Therapy:  Heparin level 0.3-0.7 units/ml Monitor platelets by anticoagulation protocol: Yes   Plan:  Heparin 4000 units IV bolus, then start heparin 1000 units/hr Check heparin level in 8 hours.   Caryl Pina 03/26/2018,12:25 AM

## 2018-03-26 NOTE — Progress Notes (Signed)
ANTICOAGULATION CONSULT NOTE - Initial Consult  Pharmacy Consult for Heparin Indication: chest pain/ACS  Allergies  Allergen Reactions  . Oxycontin [Oxycodone] Other (See Comments)    Pt states it makes her "crazy"  . Caffeine Anxiety  . Codeine Anxiety    Patient Measurements: Height: 5\' 1"  (154.9 cm) Weight: 198 lb 1.6 oz (89.9 kg) IBW/kg (Calculated) : 47.8 Heparin Dosing Weight: 70 kg  Vital Signs: Temp: 98.4 F (36.9 C) (08/04 0719) Temp Source: Oral (08/04 0719) BP: 124/66 (08/04 0719) Pulse Rate: 66 (08/04 0719)  Labs: Recent Labs    03/25/18 2221 03/26/18 0443 03/26/18 0743  HGB 11.6* 10.2*  --   HCT 35.3* 31.9*  --   PLT 155 116*  --   HEPARINUNFRC  --   --  0.33  CREATININE 1.11* 0.99  --   TROPONINI 0.08* 0.19*  --     Estimated Creatinine Clearance: 51.6 mL/min (by C-G formula based on SCr of 0.99 mg/dL).   Medical History: Past Medical History:  Diagnosis Date  . Anemia   . Cervical cancer ([place])   . Hypertension   . Peripheral vascular disease ([place])   . Stroke [facility])     Medications:  No current facility-administered medications on file prior to encounter.    Current Outpatient Medications on File Prior to Encounter  Medication Sig Dispense Refill  . carvedilol (COREG) 12.5 MG tablet Take 12.5 mg by mouth 2 (two) times daily with a meal.    . clopidogrel (PLAVIX) 75 MG tablet Take 1 tablet (75 mg total) by mouth daily. 30 tablet 2  . ferrous sulfate 325 (65 FE) MG EC tablet Take 325 mg by mouth daily.  5  . lisinopril (PRINIVIL,ZESTRIL) 10 MG tablet Take 10 mg by mouth daily.    . Vitamin D, Ergocalciferol, (DRISDOL) 50000 UNITS CAPS capsule Take 50,000 Units by mouth every 7 (seven) days.    . [DISCONTINUED] cephALEXin (KEFLEX) 500 MG capsule Take 1 capsule (500 mg total) by mouth 3 (three) times daily. 21 capsule 0     Assessment: 72 y.o. female with chest pain initiated on heparin therapy.  Initial heparin level 0.33 which is within  the goal range.  Goal of Therapy:  Heparin level 0.3-0.7 units/ml Monitor platelets by anticoagulation protocol: Yes   Plan:  Continue heparin 1000 units/hr Re-check heparin level in 8 hours.   Corinda Gubler, PharmD, FCCM 03/26/2018,9:52 AM

## 2018-03-26 NOTE — Consult Note (Addendum)
Cardiology Consult    Patient ID: Katherine Carrillo; [id number]; 29-Apr-1946   Admit date: 03/25/2018 Date of Consult: 03/26/2018  Primary Care Provider: Velna Ochs, MD Primary Cardiologist: New to [facility]  Patient Profile    Katherine Carrillo is a 72 y.o. female with past medical history of PAD (s/p right femoral artery bypass 10+ years ago), HTN, chronic anemia, and prior CVA who is being seen today for the evaluation of chest pain and elevated troponin values at the request of Dr. Myna Hidalgo.   History of Present Illness    Katherine Carrillo presented to [place] on 03/25/2018 for evaluation of chest pain and palpitations which started earlier in the day. Reports being in her usual state of health until around noon yesterday when she developed a heaviness along her shoulders bilaterally. Discomfort was worse with walking around her home and would improve with rest. Reports associated dyspnea and diaphoresis at that time. She initially thought this might represent a panic attack but says this felt very different from her previous episodes as she had never experienced shoulder and back pain with previous events. Her daughter came to visit later in the day and they went out for supper but while at the restaurant, she developed worsening pressure along her shoulders and back, dyspnea, and diaphoresis. Denies any specific chest discomfort but this was report in previous notes. No recent orthopnea, PND, or lower extremity edema. No known history of CAD but does have PAD as she is s/p femoral artery bypass over 10 years ago. 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 Known HTN and HLD (statin intolerant).   Initial labs show WBC 7.2, Hgb 11.6 (improved from 2018), platelets 155, Na+ 137, K+ 4.7, and creatinine 1.11.  D-dimer elevated to 1.22.  Initial troponin 0.08 with repeat value of 0.19.  FLP shows total cholesterol 218, triglycerides 79, HDL 56, and LDL 146. CXR with no active  cardiopulmonary disease.  CTA showed no evidence for a PE but week she was noted to have a 3 mm right lung nodule with consideration of repeat chest CT in 12 months. EKG shows normal sinus rhythm, heart rate 75, with no acute ST changes when compared to prior tracings.  She denies any recurrent shoulder/back discomfort overnight or this morning. Has remained on Heparin.   Of note she was seen by cardiologist at [facility] for palpitations.  An echocardiogram done about a year ago showed at least moderate aortic regurgitation and moderate to severe mitral regurgitation.  After that visit a surveillance echo was recommended to be done in May but she has not had a repeat echo since then.  She is a somewhat poor historian and does have moderate expressive a aphasia.  Attempts to increase her statin in the past have resulted in increasing nausea.  Past Medical History:  Diagnosis Date  . Anemia   . Cervical cancer ([place])   . Hypertension   . Peripheral vascular disease ([place])   . Stroke [facility])     Past Surgical History:  Procedure Laterality Date  . ABDOMINAL HYSTERECTOMY    . CESAREAN SECTION    . FEMORAL ARTERY - FEMORAL ARTERY BYPASS GRAFT    . TONSILLECTOMY       Home Medications:  Prior to Admission medications   Medication Sig Start Date End Date Taking? Authorizing Provider  carvedilol (COREG) 12.5 MG tablet Take 12.5 mg by mouth 2 (two) times daily with a meal.   Yes  [provider]  clopidogrel (PLAVIX) 75 MG tablet Take 1 tablet (75 mg total) by mouth daily. 12/24/16  Yes Katherine Haff, MD  ferrous sulfate 325 (65 FE) MG EC tablet Take 325 mg by mouth daily. 12/12/16  Yes [provider]  lisinopril (PRINIVIL,ZESTRIL) 10 MG tablet Take 10 mg by mouth daily. 09/25/16  Yes [provider]  Vitamin D, Ergocalciferol, (DRISDOL) 50000 UNITS CAPS capsule Take 50,000 Units by mouth every 7 (seven) days.   Yes [provider]    Inpatient  Medications: Scheduled Meds: . [START ON 03/27/2018] aspirin EC  81 mg Oral Daily  . carvedilol  12.5 mg Oral BID WC  . clopidogrel  75 mg Oral Daily  . lisinopril  10 mg Oral Daily   Continuous Infusions: . sodium chloride 500 mL (03/26/18 0046)  . sodium chloride 90 mL/hr at 03/26/18 0500  . heparin 1,000 Units/hr (03/26/18 0043)   PRN Meds: sodium chloride, acetaminophen, ALPRAZolam, nitroGLYCERIN, ondansetron (ZOFRAN) IV  Allergies:    Allergies  Allergen Reactions  . Oxycontin [Oxycodone] Other (See Comments)    Pt states it makes her "crazy"  . Caffeine Anxiety  . Codeine Anxiety    Social History:   Social History   Socioeconomic History  . Marital status: Widowed    Spouse name: Not on file  . Number of children: Not on file  . Years of education: Not on file  . Highest education level: Not on file  Occupational History  . Not on file  Social Needs  . Financial resource strain: Not on file  . Food insecurity:    Worry: Not on file    Inability: Not on file  . Transportation needs:    Medical: Not on file    Non-medical: Not on file  Tobacco Use  . Smoking status: Never Smoker  . Smokeless tobacco: Never Used  Substance and Sexual Activity  . Alcohol use: No  . Drug use: No  . Sexual activity: Never    Birth control/protection: Surgical  Lifestyle  . Physical activity:    Days per week: Not on file    Minutes per session: Not on file  . Stress: Not on file  Relationships  . Social connections:    Talks on phone: Not on file    Gets together: Not on file    Attends religious service: Not on file    Active member of club or organization: Not on file    Attends meetings of clubs or organizations: Not on file    Relationship status: Not on file  . Intimate partner violence:    Fear of current or ex partner: Not on file    Emotionally abused: Not on file    Physically abused: Not on file    Forced sexual activity: Not on file  Other Topics Concern   . Not on file  Social History Narrative  . Not on file     Family History:    Family History  Problem Relation Age of Onset  . Diabetes Sister   . Diabetes Brother       Review of Systems    General:  No chills, fever, night sweats or weight changes.  Cardiovascular:  No edema, orthopnea, palpitations, paroxysmal nocturnal dyspnea. Positive for bilateral shoulder pain and dyspnea on exertion.  Dermatological: No rash, lesions/masses Respiratory: No cough, dyspnea Urologic: No hematuria, dysuria Abdominal:   No nausea, vomiting, diarrhea, bright red blood per rectum, melena, or hematemesis Neurologic:  No visual changes, wkns, changes in mental status. All other systems reviewed and are otherwise negative except as noted above.  Physical Exam/Data    Vitals:   03/26/18 0115 03/26/18 0130 03/26/18 0313 03/26/18 0719  BP: (!) 144/78 (!) 144/82 (!) 145/73 124/66  Pulse: 72 71  66  Resp: (!) 26 (!) 28  20  Temp:   97.8 F (36.6 C) 98.4 F (36.9 C)  TempSrc:   Oral Oral  SpO2: 99% 99% 100% 98%  Weight:   198 lb 1.6 oz (89.9 kg)   Height:   5\' 1"  (1.549 m)     Intake/Output Summary (Last 24 hours) at 03/26/2018 0800 Last data filed at 03/26/2018 0500 Gross per 24 hour  Intake 1086.06 ml  Output 300 ml  Net 786.06 ml   Filed Weights   03/25/18 2117 03/26/18 0313  Weight: 190 lb (86.2 kg) 198 lb 1.6 oz (89.9 kg)   Body mass index is 37.43 kg/m.   General: Pleasant, African American female appearing in NAD Psych: Normal affect. Neuro: Alert and oriented X 3. Moves all extremities spontaneously. HEENT: Normal  Neck: Supple without bruits or JVD. Lungs:  Resp regular and unlabored, CTA without wheezing or rales. Heart: RRR no s3, s4, or murmurs. Abdomen: Soft, non-tender, non-distended, BS + x 4.  Extremities: No clubbing, cyanosis or edema. DP/PT/Radials 2+ and equal bilaterally.   EKG:  The EKG was personally reviewed and demonstrates: NSR, HR 75, with no acute  ST changes when compared to prior tracings.   Labs/Studies     Relevant CV Studies:  Echocardiogram: 12/23/2016 Study Conclusions  - Left ventricle: The cavity size was normal. Systolic function was   normal. The estimated ejection fraction was in the range of 60%   to 65%. Doppler parameters are consistent with abnormal left   ventricular relaxation (grade 1 diastolic dysfunction). Doppler   parameters are consistent with high ventricular filling pressure. - Aortic valve: There was moderate regurgitation. - Mitral valve: There was moderate to severe regurgitation.  Laboratory Data:  Chemistry Recent Labs  Lab 03/25/18 2221 03/26/18 0443  NA 137 138  K 4.7 5.6*  CL 103 104  CO2 27 22  GLUCOSE 119* 94  BUN 17 14  CREATININE 1.11* 0.99  CALCIUM 8.7* 8.6*  GFRNONAA 48* 55*  GFRAA 56* >60  ANIONGAP 7 12    Recent Labs  Lab 03/25/18 2221  PROT 7.2  ALBUMIN 3.7  AST 30  ALT 19  ALKPHOS 50  BILITOT 0.5   Hematology Recent Labs  Lab 03/25/18 2221 03/26/18 0443  WBC 7.2 6.1  RBC 3.66* 3.21*  HGB 11.6* 10.2*  HCT 35.3* 31.9*  MCV 96.4 99.4  MCH 31.7 31.8  MCHC 32.9 32.0  RDW 12.3 12.8  PLT 155 116*   Cardiac Enzymes Recent Labs  Lab 03/25/18 2221 03/26/18 0443  TROPONINI 0.08* 0.19*   No results for input(s): TROPIPOC in the last 168 hours.  BNPNo results for input(s): BNP, PROBNP in the last 168 hours.  DDimer  Recent Labs  Lab 03/25/18 2221  DDIMER 1.22*    Radiology/Studies:  Dg Chest 2 View  Result Date: 03/25/2018 CLINICAL DATA:  Bilateral leg weakness. EXAM: CHEST - 2 VIEW COMPARISON:  None. FINDINGS: The lungs are clear without focal pneumonia, edema, pneumothorax or pleural effusion. The cardiopericardial silhouette is within normal limits for size. Hiatal hernia noted. The visualized bony structures of the thorax are intact. Telemetry leads overlie the chest. IMPRESSION: No active  cardiopulmonary disease. Electronically Signed   By: Misty Stanley M.D.   On: 03/25/2018 23:34   Ct Angio Chest Pe W And/or Wo Contrast  Result Date: 03/26/2018 CLINICAL DATA:  Shortness of breath EXAM: CT ANGIOGRAPHY CHEST WITH CONTRAST TECHNIQUE: Multidetector CT imaging of the chest was performed using the standard protocol during bolus administration of intravenous contrast. Multiplanar CT image reconstructions and MIPs were obtained to evaluate the vascular anatomy. CONTRAST:  20mL ISOVUE-370 IOPAMIDOL (ISOVUE-370) INJECTION 76% COMPARISON:  None. FINDINGS: Cardiovascular: Heart size upper normal to mildly increased. No pericardial effusion. Atherosclerotic calcification is noted in the wall of the thoracic aorta. No filling defect within the opacified pulmonary arteries to suggest the presence of an acute pulmonary embolus. Mediastinum/Nodes: No mediastinal lymphadenopathy. There is no hilar lymphadenopathy. The esophagus has normal imaging features. There is no axillary lymphadenopathy. Lungs/Pleura: The central tracheobronchial airways are patent. 3 mm subpleural nodule noted right lung (series 6: Image 25). Left lower lobe atelectasis noted. Upper Abdomen: Large hiatal hernia without evidence for gastric outlet obstruction. Bilateral renal cysts measure up to 2.8 cm in the upper pole the left kidney. Neither kidney has been incompletely visualized. Musculoskeletal: No worrisome lytic or sclerotic osseous abnormality. Review of the MIP images confirms the above findings. IMPRESSION: 1. No CT evidence for acute pulmonary embolus. 2. 3 mm right lung nodule. No follow-up needed if patient is low-risk. Non-contrast chest CT can be considered in 12 months if patient is high-risk. This recommendation follows the consensus statement: Guidelines for Management of Incidental Pulmonary Nodules Detected on CT Images: From the Fleischner Society 2017; Radiology 2017; 284:228-243. 3. Large hiatal hernia. Electronically Signed   By: Misty Stanley M.D.   On: 03/26/2018 00:21      Assessment & Plan    1. Chest Pain/ Elevated Troponin - Presented for evaluation of new-onset heaviness along her shoulders bilaterally with associated dyspnea and diaphoresis which was occurring with exertion. Initial troponin found to be elevated to 0.08 with repeat value trending up to 0.19. EKG shows no acute ST changes. D-dimer was elevated but CTA was negative for PE.  She has been pain-free since being started on Heparin. - Her presenting symptoms and enzyme trend are concerning for an NSTEMI. She has multiple cardiac risk factors including HTN, HLD, and PVD. Will review with MD but would anticipate a cardiac catheterization tomorrow for definitive evaluation.  - continue to cycle cardiac enzymes. Continue ASA, Plavix, and BB therapy. Intolerant to multiple statins in the past.   2. PAD - s/p right femoral artery bypass 10+ years ago. Denies any recent claudication symptoms.  -Reports being followed by Vascular Surgery on an annual basis.   3. Prior CVA - with mild residual expressive aphasia.  - remains on Plavix. Intolerant to statin therapy secondary to myalgias.   4. HLD - FLP shows total cholesterol 218, triglycerides 79, HDL 56, and LDL 146.   - pending catheterization results, would consider referral to the [facility] as an outpatient as she has been intolerant to multiple statins in the past.   5. Chronic Normocytic Anemia - baseline Hgb 10.0 - 11.0. Stable at 11.6 on admission.     For questions or updates, please contact [place] Please consult [email or web address] for contact info under Cardiology/STEMI.  Signed, Erma Heritage, PA-C 03/26/2018, 8:00 AM Pager: [phone]  Patient is seen and examined.  History as above.  She has a history of a previous stroke with a aphasia and has had somewhat  poor history.  She was admitted about a year ago with a TIA and during that admission had an echocardiogram that was reported showing moderate to severe mitral  regurgitation as well as moderate aortic regurgitation.  She was seen by cardiology with complaints of palpitations at [facility] and advised to have a follow-up echo in May but this has never been done.  She denies PND, orthopnea or significant edema.  On examination she is a somewhat poor historian, her lungs were clear cardiac exam showed normal S1 and S2 and there was no S3.,  There was a faint 1/6 to 2/6 murmur at the left sternal border previous surgical scars are noted on the right lower extremity, the pulses are mildly diminished in the feet.  Twelve-lead EKG shows voltage for LVH but no acute changes.  1.  Chest discomfort with elevated troponins in a patient with known vascular disease 2.  History of valvular heart disease with moderate to severe mitral regurgitation and moderate aortic regurgitation 3.  History of hypertension 4.  Hyperlipidemia 5.  Obesity 6.  History of previous stroke with a fascia  Recommendations:  I agree that would proceed with cardiac catheterization.  She will need to have an echocardiogram because of the previous possibility of valvular heart disease also. W.Spencer Loyce Dys MD [facility]

## 2018-03-26 NOTE — Progress Notes (Signed)
[place] TEAM 1 - Stepdown/ICU TEAM  Katherine Carrillo  HDQ:[id number] DOB: [birth year] DOA: 03/25/2018 PCP: Velna Ochs, MD    Brief Narrative:  72 y.o. 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   In the ED EKG featured a sinus rhythm with LVH.  Chest x-ray was negative for acute cardiopulmonary disease.  D-dimer was elevated to 1.22 and CTa chest was negative for acute PE.  Troponin was elevated to 0.08.   Subjective: Pt seen for a f/u visit.    Assessment & Plan:  Chest pain Pt to have cardiac cath in AM - Cards following   PAD  Mild renal insufficiency Resolved w/ crt now normal   History of CVA  3 mm right lung nodule Incidentally noted on CTa chest   DVT prophylaxis: IV heparin Code Status: FULL CODE Family Communication: no family present at time of exam  Disposition Plan: SDU awaiting cardiac cath   Consultants:  Cardiology  Antimicrobials:  none  Objective: Blood pressure 126/67, pulse 72, temperature 97.9 F (36.6 C), temperature source Oral, resp. rate 18, height 5\' 1"  (1.549 m), weight 89.9 kg (198 lb 1.6 oz), SpO2 99 %.  Intake/Output Summary (Last 24 hours) at 03/26/2018 1411 Last data filed at 03/26/2018 1100 Gross per 24 hour  Intake 1782.28 ml  Output 700 ml  Net 1082.28 ml   Filed Weights   03/25/18 2117 03/26/18 0313  Weight: 86.2 kg (190 lb) 89.9 kg (198 lb 1.6 oz)    Examination: Pt seen for a f/u visit.    CBC: Recent Labs  Lab 03/25/18 2221 03/26/18 0443  WBC 7.2 6.1  NEUTROABS 5.8  --   HGB 11.6* 10.2*  HCT 35.3* 31.9*  MCV 96.4 99.4  PLT 155 119*   Basic Metabolic Panel: Recent Labs  Lab 03/25/18 2221 03/26/18 0443  NA 137 138  K 4.7 5.6*  CL 103 104  CO2 27 22  GLUCOSE 119* 94  BUN 17 14  CREATININE 1.11* 0.99  CALCIUM 8.7* 8.6*    GFR: Estimated Creatinine Clearance: 51.6 mL/min (by C-G formula based on SCr of 0.99 mg/dL).  Liver Function Tests: Recent Labs  Lab 03/25/18 2221  AST 30  ALT 19  ALKPHOS 50  BILITOT 0.5  PROT 7.2  ALBUMIN 3.7    Cardiac Enzymes: Recent Labs  Lab 03/25/18 2221 03/26/18 0443 03/26/18 1025  TROPONINI 0.08* 0.19* 0.20*     Recent Results (from the past 240 hour(s))  MRSA PCR Screening     Status: None   Collection Time: 03/26/18  3:12 AM  Result Value Ref Range Status   MRSA by PCR NEGATIVE NEGATIVE Final    Comment:        The GeneXpert MRSA Assay (FDA approved for NASAL specimens only), is one component of a comprehensive MRSA colonization surveillance program. It is not intended to diagnose MRSA infection nor to guide or monitor treatment for MRSA infections. Performed at [facility], [address] [place], [place] [address]      Scheduled Meds: . [START ON 03/27/2018] aspirin  81 mg Oral Pre-Cath  . [START ON 03/27/2018] aspirin EC  81 mg Oral Daily  . carvedilol  12.5 mg Oral BID WC  . clopidogrel  75 mg Oral Daily  . lisinopril  10 mg Oral Daily  . sodium chloride  flush  3 mL Intravenous Q12H    LOS: 0 days   Cherene Altes, MD [facility] Office  [phone] Pager - Text Page per Amion  If 7PM-7AM, please contact night-coverage per Amion 03/26/2018, 2:11 PM

## 2018-03-26 NOTE — ED Notes (Signed)
EDP in to update pt on plan and process. Alert, NAD, calm, interactive, resps e/u, speaking in clear complete sentences, no dyspnea noted, skin W&D, VSS, states, "feel better", previous sx resolved, (denies: pain, sob, nausea, dizziness or visual changes at this time). Family at [facility].

## 2018-03-26 NOTE — Progress Notes (Signed)
   Called the patient's daughter at the patient's request and reviewed her current condition and plans for a cardiac catheterization tomorrow. All questions were answered. Unable to give a specific time for the procedure as she will be an add-on case from the weekend. Most likely will be in the afternoon hours. She voiced understanding and was appreciative of the update.   Signed, Erma Heritage, PA-C 03/26/2018, 10:29 AM Pager: [phone]

## 2018-03-26 NOTE — Progress Notes (Signed)
Pt arrived from [facility] via [facility] in no acute distress, VSS, IV heparin infusing at 1000 units/hr.  Pt assessed and oriented to unit.  [facility] paged x 2 via AMION to notify of pt arrival.

## 2018-03-26 NOTE — H&P (Signed)
History and Physical    Katherine Carrillo EKC:[id number] DOB: [birth year] DOA: 03/25/2018  PCP: Velna Ochs, MD   Patient coming from: Home   Chief Complaint: Chest pressure, palpitations, diaphoresis   HPI: Katherine Carrillo is a 72 y.o. female with medical history significant for history of stroke with mild residual expressive aphasia and dysarthria, peripheral arterial disease status post femoral artery bypass on the right, and occasional panic attacks who presents to the emergency department for evaluation of chest pressure, palpitations, and diaphoresis.  Patient reports that she developed palpitations with chest pressure shortly after noon on 03/25/2018.  Initially, she felt as though this was a panic attack, but symptoms persisted longer than her panic attacks and did not respond to her usual behavioral interventions.  She went on to experience the symptoms, waxing and waning throughout the early afternoon, and then went out to eat with her daughter, hoping that this might help her to relax.  Symptoms worsened at the restaurant and EMS was called.  Her daughter reports that she was diaphoretic.  Patient reports some mild dyspnea associated with these episodes but denies nausea.  She denies recent cough, fevers, or chills.  She reports chronic mild swelling to the bilateral lower extremities, but denies tenderness.  ED Course: Upon arrival to the ED, patient is found to be afebrile, saturating well on room air, slightly tachypneic, and vitals otherwise normal.  EKG features a sinus rhythm with LVH.  Chest x-ray is negative for acute cardiopulmonary disease.  Chemistry panel is notable for a creatinine of 1.11, up from 0.91 last year.  CBC features a mild normocytic anemia.  D-dimer was elevated to 1.22 and CTA chest is negative for acute PE.  Troponin was elevated to 0.08.  ED physician discussed case with cardiology, full dose aspirin was given, 1 L of normal saline administered, and the patient was  started on IV heparin infusion.  Transfer to [facility] was arranged for admission.  She denies chest pain on arrival.  Review of Systems:  All other systems reviewed and apart from HPI, are negative.  Past Medical History:  Diagnosis Date  . Anemia   . Cervical cancer ([place])   . Hypertension   . Peripheral vascular disease ([place])   . Stroke [facility])     Past Surgical History:  Procedure Laterality Date  . ABDOMINAL HYSTERECTOMY    . CESAREAN SECTION    . FEMORAL ARTERY - FEMORAL ARTERY BYPASS GRAFT    . TONSILLECTOMY       reports that she has never smoked. She has never used smokeless tobacco. She reports that she does not drink alcohol or use drugs.  Allergies  Allergen Reactions  . Oxycontin [Oxycodone] Other (See Comments)    Pt states it makes her "crazy"  . Caffeine Anxiety  . Codeine Anxiety    Family History  Problem Relation Age of Onset  . Diabetes Sister   . Diabetes Brother      Prior to Admission medications   Medication Sig Start Date End Date Taking? Authorizing Provider  carvedilol (COREG) 12.5 MG tablet Take 12.5 mg by mouth 2 (two) times daily with a meal.   Yes [provider]  clopidogrel (PLAVIX) 75 MG tablet Take 1 tablet (75 mg total) by mouth daily. 12/24/16  Yes Bonnielee Haff, MD  ferrous sulfate 325 (65 FE) MG EC tablet Take 325 mg by mouth daily. 12/12/16  Yes [provider]  lisinopril (PRINIVIL,ZESTRIL) 10 MG tablet Take 10 mg  by mouth daily. 09/25/16  Yes [provider]  Vitamin D, Ergocalciferol, (DRISDOL) 50000 UNITS CAPS capsule Take 50,000 Units by mouth every 7 (seven) days.   Yes [provider]    Physical Exam: Vitals:   03/26/18 0100 03/26/18 0115 03/26/18 0130 03/26/18 0313  BP: (!) 160/89 (!) 144/78 (!) 144/82 (!) 145/73  Pulse: 71 72 71   Resp: 17 (!) 26 (!) 28   Temp:    97.8 F (36.6 C)  TempSrc:    Oral  SpO2: 100% 99% 99% 100%  Weight:    89.9 kg (198 lb 1.6 oz)  Height:     5\' 1"  (1.549 m)      Constitutional: NAD, calm, comfortable Eyes: PERTLA, lids and conjunctivae normal ENMT: Mucous membranes are moist. Posterior pharynx clear of any exudate or lesions.   Neck: normal, supple, no masses, no thyromegaly Respiratory: clear to auscultation bilaterally, no wheezing, no crackles. Normal respiratory effort.   Cardiovascular: S1 & S2 heard, regular rate and rhythm. No extremity edema.   Abdomen: No distension, no tenderness, soft. Bowel sounds normal.  Musculoskeletal: no clubbing / cyanosis. No joint deformity upper and lower extremities.    Skin: no significant rashes, lesions, ulcers. Warm, dry, well-perfused. Neurologic: No facial asymmetry. Mild expressive aphasia. Sensation intact. Strength 5/5 in all 4 limbs.  Psychiatric: Alert and oriented x 3. Pleasant and cooperative.     Labs on Admission: I have personally reviewed following labs and imaging studies  CBC: Recent Labs  Lab 03/25/18 2221  WBC 7.2  NEUTROABS 5.8  HGB 11.6*  HCT 35.3*  MCV 96.4  PLT 962   Basic Metabolic Panel: Recent Labs  Lab 03/25/18 2221  NA 137  K 4.7  CL 103  CO2 27  GLUCOSE 119*  BUN 17  CREATININE 1.11*  CALCIUM 8.7*   GFR: Estimated Creatinine Clearance: 46 mL/min (A) (by C-G formula based on SCr of 1.11 mg/dL (H)). Liver Function Tests: Recent Labs  Lab 03/25/18 2221  AST 30  ALT 19  ALKPHOS 50  BILITOT 0.5  PROT 7.2  ALBUMIN 3.7   No results for input(s): LIPASE, AMYLASE in the last 168 hours. No results for input(s): AMMONIA in the last 168 hours. Coagulation Profile: No results for input(s): INR, PROTIME in the last 168 hours. Cardiac Enzymes: Recent Labs  Lab 03/25/18 2221  TROPONINI 0.08*   BNP (last 3 results) No results for input(s): PROBNP in the last 8760 hours. HbA1C: No results for input(s): HGBA1C in the last 72 hours. CBG: No results for input(s): GLUCAP in the last 168 hours. Lipid Profile: No results for input(s):  CHOL, HDL, LDLCALC, TRIG, CHOLHDL, LDLDIRECT in the last 72 hours. Thyroid Function Tests: No results for input(s): TSH, T4TOTAL, FREET4, T3FREE, THYROIDAB in the last 72 hours. Anemia Panel: No results for input(s): VITAMINB12, FOLATE, FERRITIN, TIBC, IRON, RETICCTPCT in the last 72 hours. Urine analysis:    Component Value Date/Time   COLORURINE YELLOW 02/16/2018 0146   APPEARANCEUR HAZY (A) 02/16/2018 0146   LABSPEC <1.005 (L) 02/16/2018 0146   PHURINE 5.5 02/16/2018 0146   GLUCOSEU NEGATIVE 02/16/2018 0146   HGBUR NEGATIVE 02/16/2018 0146   BILIRUBINUR NEGATIVE 02/16/2018 0146   KETONESUR NEGATIVE 02/16/2018 0146   PROTEINUR NEGATIVE 02/16/2018 0146   NITRITE NEGATIVE 02/16/2018 0146   LEUKOCYTESUR SMALL (A) 02/16/2018 0146   Sepsis Labs: @LABRCNTIP (procalcitonin:4,lacticidven:4) )No results found for this or any previous visit (from the past 240 hour(s)).   Radiological Exams on  Admission: Dg Chest 2 View  Result Date: 03/25/2018 CLINICAL DATA:  Bilateral leg weakness. EXAM: CHEST - 2 VIEW COMPARISON:  None. FINDINGS: The lungs are clear without focal pneumonia, edema, pneumothorax or pleural effusion. The cardiopericardial silhouette is within normal limits for size. Hiatal hernia noted. The visualized bony structures of the thorax are intact. Telemetry leads overlie the chest. IMPRESSION: No active cardiopulmonary disease. Electronically Signed   By: Misty Stanley M.D.   On: 03/25/2018 23:34   Ct Angio Chest Pe W And/or Wo Contrast  Result Date: 03/26/2018 CLINICAL DATA:  Shortness of breath EXAM: CT ANGIOGRAPHY CHEST WITH CONTRAST TECHNIQUE: Multidetector CT imaging of the chest was performed using the standard protocol during bolus administration of intravenous contrast. Multiplanar CT image reconstructions and MIPs were obtained to evaluate the vascular anatomy. CONTRAST:  41mL ISOVUE-370 IOPAMIDOL (ISOVUE-370) INJECTION 76% COMPARISON:  None. FINDINGS: Cardiovascular: Heart  size upper normal to mildly increased. No pericardial effusion. Atherosclerotic calcification is noted in the wall of the thoracic aorta. No filling defect within the opacified pulmonary arteries to suggest the presence of an acute pulmonary embolus. Mediastinum/Nodes: No mediastinal lymphadenopathy. There is no hilar lymphadenopathy. The esophagus has normal imaging features. There is no axillary lymphadenopathy. Lungs/Pleura: The central tracheobronchial airways are patent. 3 mm subpleural nodule noted right lung (series 6: Image 25). Left lower lobe atelectasis noted. Upper Abdomen: Large hiatal hernia without evidence for gastric outlet obstruction. Bilateral renal cysts measure up to 2.8 cm in the upper pole the left kidney. Neither kidney has been incompletely visualized. Musculoskeletal: No worrisome lytic or sclerotic osseous abnormality. Review of the MIP images confirms the above findings. IMPRESSION: 1. No CT evidence for acute pulmonary embolus. 2. 3 mm right lung nodule. No follow-up needed if patient is low-risk. Non-contrast chest CT can be considered in 12 months if patient is high-risk. This recommendation follows the consensus statement: Guidelines for Management of Incidental Pulmonary Nodules Detected on CT Images: From the Fleischner Society 2017; Radiology 2017; 284:228-243. 3. Large hiatal hernia. Electronically Signed   By: Misty Stanley M.D.   On: 03/26/2018 00:21    EKG: Independently reviewed. Sinus rhythm, LVH.   Assessment/Plan   1. Chest pain  - Presents with intermittent chest pressure, palpitations, and diaphoresis   - No acute ischemic features appreciated on EKG, CTA negative for PE or acute cardiopulmonary disease, troponin positive at 0.08  - She has been pain-free since admission  - ED physician discussed case with cardiology, treated patient with ASA 325, and started IV heparin infusion  - Continue cardiac monitoring, trend troponin, repeat EKG, continue Plavix,  beta-blocker, and ARB; she has not tolerated statin in the past  2. PAD  - Status-post femoral bypass graft of right leg  - Pt reports continues to follow annually and has not had recurrent claudication  - Continue Plavix    3. Hx of CVA  - Patient describes mild residual expressive aphasia, denies any new neurologic complaints  - Continue Plavix, taken off of statin last year d/t myalgias   4. Mild renal insufficiency  - SCr is 1.11 on admission, slightly up from priors  - She was given a liter of IVF in ED and continued on gentle IVF hydration  - Repeat chem panel in am    DVT prophylaxis: IV heparin infusion  Code Status: Full  Family Communication: Daughter updated by phone  Consults called: Cardiology consulted by ED physician  Admission status: [place],  MD [facility] Pager [phone]  If 7PM-7AM, please contact night-coverage [email or web address] Password [email or web address]  03/26/2018, 4:10 AM

## 2018-03-26 NOTE — Progress Notes (Signed)
ANTICOAGULATION CONSULT NOTE - Initial Consult  Pharmacy Consult for Heparin Indication: chest pain/ACS  Allergies  Allergen Reactions  . Oxycontin [Oxycodone] Other (See Comments)    Pt states it makes her "crazy"  . Caffeine Anxiety  . Codeine Anxiety    Patient Measurements: Height: 5\' 1"  (154.9 cm) Weight: 198 lb 1.6 oz (89.9 kg) IBW/kg (Calculated) : 47.8 Heparin Dosing Weight: 70 kg  Vital Signs: Temp: 97.9 F (36.6 C) (08/04 1141) Temp Source: Oral (08/04 1141) BP: 126/67 (08/04 1141) Pulse Rate: 72 (08/04 1141)  Labs: Recent Labs    03/25/18 2221 03/26/18 0443 03/26/18 0743 03/26/18 1025 03/26/18 1530  HGB 11.6* 10.2*  --   --   --   HCT 35.3* 31.9*  --   --   --   PLT 155 116*  --   --   --   HEPARINUNFRC  --   --  0.33  --  0.54  CREATININE 1.11* 0.99  --   --   --   TROPONINI 0.08* 0.19*  --  0.20*  --     Estimated Creatinine Clearance: 51.6 mL/min (by C-G formula based on SCr of 0.99 mg/dL).   Medical History: Past Medical History:  Diagnosis Date  . Anemia   . Cervical cancer ([place])   . Hypertension   . Peripheral vascular disease ([place])   . Stroke [facility])     Medications:  No current facility-administered medications on file prior to encounter.    Current Outpatient Medications on File Prior to Encounter  Medication Sig Dispense Refill  . carvedilol (COREG) 12.5 MG tablet Take 12.5 mg by mouth 2 (two) times daily with a meal.    . clopidogrel (PLAVIX) 75 MG tablet Take 1 tablet (75 mg total) by mouth daily. 30 tablet 2  . ferrous sulfate 325 (65 FE) MG EC tablet Take 325 mg by mouth daily.  5  . GARLIC PO Take 1 tablet by mouth daily.    Marland Kitchen lisinopril (PRINIVIL,ZESTRIL) 10 MG tablet Take 10 mg by mouth daily.    . Multiple Vitamin (MULTIVITAMIN WITH MINERALS) TABS tablet Take 1 tablet by mouth daily.    . Omega-3 Fatty Acids (FISH OIL) 1000 MG CAPS Take 1,000 mg by mouth at bedtime.    Marland Kitchen Propylene Glycol 0.6 % SOLN Place 1 drop into both  eyes daily.    . vitamin C (ASCORBIC ACID) 500 MG tablet Take 500 mg by mouth daily.    . Vitamin D, Ergocalciferol, (DRISDOL) 50000 UNITS CAPS capsule Take 50,000 Units by mouth every 7 (seven) days.    . [DISCONTINUED] cephALEXin (KEFLEX) 500 MG capsule Take 1 capsule (500 mg total) by mouth 3 (three) times daily. 21 capsule 0     Assessment: 72 y.o. female with chest pain initiated on heparin therapy. Cardiac cath planned for Monday 8/5.  Repeat afternoon heparin level 0.54 which is within the goal range. Hgb 10.2, plts 116. No bleeding noted   Goal of Therapy:  Heparin level 0.3-0.7 units/ml Monitor platelets by anticoagulation protocol: Yes   Plan:  Continue heparin 1000 units/hr Daily heparin level, CBC, monitor for bleeding  Harrietta Guardian, PharmD PGY1 Pharmacy Resident 03/26/2018    4:35 PM

## 2018-03-26 NOTE — Progress Notes (Signed)
  Echocardiogram 2D Echocardiogram has been performed.  Katherine Carrillo 03/26/2018, 3:01 PM

## 2018-03-27 ENCOUNTER — Encounter (HOSPITAL_COMMUNITY): Payer: Self-pay | Admitting: Cardiovascular Disease

## 2018-03-27 ENCOUNTER — Encounter (HOSPITAL_COMMUNITY)
Admission: EM | Disposition: A | Discharge status: Home / Self Care | Payer: Self-pay | Source: Home / Self Care | Attending: Emergency Medicine

## 2018-03-27 DIAGNOSIS — E782 Mixed hyperlipidemia: Secondary | ICD-10-CM

## 2018-03-27 DIAGNOSIS — I5030 Unspecified diastolic (congestive) heart failure: Secondary | ICD-10-CM | POA: Diagnosis not present

## 2018-03-27 DIAGNOSIS — R079 Chest pain, unspecified: Secondary | ICD-10-CM | POA: Diagnosis not present

## 2018-03-27 DIAGNOSIS — I214 Non-ST elevation (NSTEMI) myocardial infarction: Secondary | ICD-10-CM | POA: Diagnosis not present

## 2018-03-27 DIAGNOSIS — R748 Abnormal levels of other serum enzymes: Secondary | ICD-10-CM | POA: Diagnosis not present

## 2018-03-27 DIAGNOSIS — I110 Hypertensive heart disease with heart failure: Secondary | ICD-10-CM | POA: Diagnosis not present

## 2018-03-27 HISTORY — PX: LEFT HEART CATH AND CORONARY ANGIOGRAPHY: CATH118249

## 2018-03-27 LAB — CBC
HCT: 33.4 % — ABNORMAL LOW (ref 36.0–46.0)
HEMOGLOBIN: 10.5 g/dL — AB (ref 12.0–15.0)
MCH: 31.3 pg (ref 26.0–34.0)
MCHC: 31.4 g/dL (ref 30.0–36.0)
MCV: 99.7 fL (ref 78.0–100.0)
Platelets: 166 10*3/uL (ref 150–400)
RBC: 3.35 MIL/uL — ABNORMAL LOW (ref 3.87–5.11)
RDW: 13 % (ref 11.5–15.5)
WBC: 4.9 10*3/uL (ref 4.0–10.5)

## 2018-03-27 LAB — PROTIME-INR
INR: 1.14
PROTHROMBIN TIME: 14.5 s (ref 11.4–15.2)

## 2018-03-27 LAB — BASIC METABOLIC PANEL
Anion gap: 9 (ref 5–15)
BUN: 14 mg/dL (ref 8–23)
CALCIUM: 8.8 mg/dL — AB (ref 8.9–10.3)
CHLORIDE: 107 mmol/L (ref 98–111)
CO2: 24 mmol/L (ref 22–32)
CREATININE: 1 mg/dL (ref 0.44–1.00)
GFR calc Af Amer: >60 mL/min (ref >60)
GFR calc non Af Amer: 55 mL/min — ABNORMAL LOW (ref >60)
Glucose, Bld: 84 mg/dL (ref 70–99)
Potassium: 4 mmol/L (ref 3.5–5.1)
SODIUM: 140 mmol/L (ref 135–145)

## 2018-03-27 LAB — HEPARIN LEVEL (UNFRACTIONATED): HEPARIN UNFRACTIONATED: 0.67 [IU]/mL (ref 0.30–0.70)

## 2018-03-27 SURGERY — LEFT HEART CATH AND CORONARY ANGIOGRAPHY
Anesthesia: LOCAL

## 2018-03-27 MED ORDER — FENTANYL CITRATE (PF) 100 MCG/2ML IJ SOLN
INTRAMUSCULAR | Status: DC | PRN
  Administered 2018-03-27: 25 ug via INTRAVENOUS

## 2018-03-27 MED ORDER — SODIUM CHLORIDE 0.9% FLUSH: 3.0000 mL | INTRAVENOUS | Status: DC | PRN

## 2018-03-27 MED ORDER — IOPAMIDOL (ISOVUE-370) INJECTION 76%
INTRAVENOUS | Status: DC | PRN
  Administered 2018-03-27: 40 mL via INTRA_ARTERIAL

## 2018-03-27 MED ORDER — NITROGLYCERIN 1 MG/10 ML FOR IR/CATH LAB
INTRA_ARTERIAL | Status: AC
  Filled 2018-03-27: qty 10

## 2018-03-27 MED ORDER — ACETAMINOPHEN 325 MG PO TABS
650.0000 mg | ORAL_TABLET | ORAL | Status: DC | PRN
  Administered 2018-03-27: 650 mg via ORAL
  Filled 2018-03-27: qty 2

## 2018-03-27 MED ORDER — HEPARIN (PORCINE) IN NACL 1000-0.9 UT/500ML-% IV SOLN
INTRAVENOUS | Status: AC
  Filled 2018-03-27: qty 500

## 2018-03-27 MED ORDER — SODIUM CHLORIDE 0.9% FLUSH
3.0000 mL | Freq: Two times a day (BID) | INTRAVENOUS | Status: DC
  Administered 2018-03-28: 3 mL via INTRAVENOUS

## 2018-03-27 MED ORDER — HYDRALAZINE HCL 20 MG/ML IJ SOLN
INTRAMUSCULAR | Status: AC
  Filled 2018-03-27: qty 1

## 2018-03-27 MED ORDER — HEPARIN SODIUM (PORCINE) 1000 UNIT/ML IJ SOLN
INTRAMUSCULAR | Status: DC | PRN
  Administered 2018-03-27: 5000 [IU] via INTRAVENOUS

## 2018-03-27 MED ORDER — SODIUM CHLORIDE 0.9 % IV SOLN: INTRAVENOUS | Status: DC

## 2018-03-27 MED ORDER — LIDOCAINE HCL (PF) 1 % IJ SOLN
INTRAMUSCULAR | Status: DC | PRN
  Administered 2018-03-27: 7.5 mL via INTRA_ARTERIAL

## 2018-03-27 MED ORDER — ASPIRIN 81 MG PO CHEW
81.0000 mg | CHEWABLE_TABLET | Freq: Every day | ORAL | Status: DC
  Administered 2018-03-28: 81 mg via ORAL
  Filled 2018-03-27: qty 1

## 2018-03-27 MED ORDER — SODIUM CHLORIDE 0.9 % IV SOLN: 250.0000 mL | INTRAVENOUS | Status: DC | PRN

## 2018-03-27 MED ORDER — MIDAZOLAM HCL 2 MG/2ML IJ SOLN
INTRAMUSCULAR | Status: DC | PRN
  Administered 2018-03-27: 1 mg via INTRAVENOUS

## 2018-03-27 MED ORDER — HEPARIN (PORCINE) IN NACL 1000-0.9 UT/500ML-% IV SOLN
INTRAVENOUS | Status: DC | PRN
  Administered 2018-03-27 (×2): 500 mL

## 2018-03-27 MED ORDER — LIDOCAINE HCL (PF) 1 % IJ SOLN
INTRAMUSCULAR | Status: DC | PRN
  Administered 2018-03-27: 2 mL via SUBCUTANEOUS

## 2018-03-27 MED ORDER — VERAPAMIL HCL 2.5 MG/ML IV SOLN
INTRAVENOUS | Status: AC
  Filled 2018-03-27: qty 2

## 2018-03-27 MED ORDER — HYDRALAZINE HCL 20 MG/ML IJ SOLN
INTRAMUSCULAR | Status: DC | PRN
  Administered 2018-03-27: 10 mg via INTRAVENOUS

## 2018-03-27 MED ORDER — ONDANSETRON HCL 4 MG/2ML IJ SOLN: 4.0000 mg | Freq: Four times a day (QID) | INTRAMUSCULAR | Status: DC | PRN

## 2018-03-27 MED ORDER — HEPARIN SODIUM (PORCINE) 1000 UNIT/ML IJ SOLN
INTRAMUSCULAR | Status: AC
  Filled 2018-03-27: qty 1

## 2018-03-27 MED ORDER — MIDAZOLAM HCL 2 MG/2ML IJ SOLN
INTRAMUSCULAR | Status: AC
  Filled 2018-03-27: qty 2

## 2018-03-27 MED ORDER — FENTANYL CITRATE (PF) 100 MCG/2ML IJ SOLN
INTRAMUSCULAR | Status: AC
  Filled 2018-03-27: qty 2

## 2018-03-27 MED ORDER — IOPAMIDOL (ISOVUE-370) INJECTION 76%
INTRAVENOUS | Status: AC
  Filled 2018-03-27: qty 100

## 2018-03-27 MED ORDER — LIDOCAINE HCL (PF) 1 % IJ SOLN
INTRAMUSCULAR | Status: AC
  Filled 2018-03-27: qty 30

## 2018-03-27 SURGICAL SUPPLY — 11 items
CATH INFINITI 5FR ANG PIGTAIL (CATHETERS) ×2 IMPLANT
CATH OPTITORQUE TIG 4.0 5F (CATHETERS) ×2 IMPLANT
DEVICE RAD COMP TR BAND LRG (VASCULAR PRODUCTS) ×2 IMPLANT
GLIDESHEATH SLEND A-KIT 6F 22G (SHEATH) ×2 IMPLANT
GUIDEWIRE INQWIRE 1.5J.035X260 (WIRE) ×1 IMPLANT
INQWIRE 1.5J .035X260CM (WIRE) ×2
KIT HEART LEFT (KITS) ×2 IMPLANT
PACK CARDIAC CATHETERIZATION (CUSTOM PROCEDURE TRAY) ×2 IMPLANT
TRANSDUCER W/STOPCOCK (MISCELLANEOUS) ×2 IMPLANT
TUBING CIL FLEX 10 FLL-RA (TUBING) ×2 IMPLANT
WIRE HI TORQ VERSACORE-J 145CM (WIRE) ×2 IMPLANT

## 2018-03-27 NOTE — Progress Notes (Signed)
[place] for Heparin Indication: chest pain/ACS  Allergies  Allergen Reactions  . Oxycontin [Oxycodone] Other (See Comments)    Pt states it makes her "crazy"  . Caffeine Anxiety  . Codeine Anxiety    Patient Measurements: Height: 5\' 1"  (154.9 cm) Weight: 199 lb 1.6 oz (90.3 kg) IBW/kg (Calculated) : 47.8 Heparin Dosing Weight: 70 kg  Vital Signs: Temp: 97.7 F (36.5 C) (08/05 0737) Temp Source: Oral (08/05 0737) BP: 150/85 (08/05 0737) Pulse Rate: 70 (08/05 0737)  Labs: Recent Labs    03/25/18 2221 03/26/18 0443 03/26/18 0743 03/26/18 1025 03/26/18 1530 03/27/18 0416  HGB 11.6* 10.2*  --   --   --  10.5*  HCT 35.3* 31.9*  --   --   --  33.4*  PLT 155 116*  --   --   --  166  LABPROT  --   --   --   --   --  14.5  INR  --   --   --   --   --  1.14  HEPARINUNFRC  --   --  0.33  --  0.54 0.67  CREATININE 1.11* 0.99  --   --   --  1.00  TROPONINI 0.08* 0.19*  --  0.20* 0.18*  --     Estimated Creatinine Clearance: 51.3 mL/min (by C-G formula based on SCr of 1 mg/dL).     Assessment: 72 y.o. female with chest pain initiated on heparin therapy. Cardiac cath planned for Monday 8/5.  Heparin level and CBC stable this AM  Goal of Therapy:  Heparin level 0.3-0.7 units/ml Monitor platelets by anticoagulation protocol: Yes   Plan:  Continue heparin 1000 units/hr Follow up after cath  Thank you Anette Guarneri, PharmD [phone]  03/27/2018    8:44 AM

## 2018-03-27 NOTE — Progress Notes (Signed)
Pt received back from cath lab. TR band in tact on right wrist. Pt instructed with teach back to keep that extremity elevated. Pt continues to be alert and oriented and instructed to call when needing to get OOB for restroom. No complaints of procedure pain at this time.

## 2018-03-27 NOTE — Progress Notes (Addendum)
Progress Note  Patient Name: Katherine Carrillo Date of Encounter: 03/27/2018  Primary Cardiologist: New to [facility]   Subjective   Denies chest pain this AM. Anticipating cardiac cath for definitive evaluation today, 03/27/2018.  Inpatient Medications    Scheduled Meds: . aspirin EC  81 mg Oral Daily  . carvedilol  12.5 mg Oral BID WC  . clopidogrel  75 mg Oral Daily  . [START ON 03/28/2018] lisinopril  10 mg Oral Daily  . sodium chloride flush  3 mL Intravenous Q12H   Continuous Infusions: . sodium chloride Stopped (03/26/18 1221)  . sodium chloride    . sodium chloride    . heparin 1,000 Units/hr (03/27/18 0106)   PRN Meds: sodium chloride, sodium chloride, acetaminophen, ALPRAZolam, nitroGLYCERIN, ondansetron (ZOFRAN) IV, sodium chloride flush   Vital Signs    Vitals:   03/26/18 1725 03/26/18 1918 03/26/18 2355 03/27/18 0439  BP: (!) 154/84 126/72 118/68 (!) 145/78  Pulse: 77 75 79 66  Resp: 20 18    Temp: 98.8 F (37.1 C) 98.2 F (36.8 C) 98.2 F (36.8 C) 98.6 F (37 C)  TempSrc: Oral Oral Oral Oral  SpO2: 96% 98% 98% 99%  Weight:    199 lb 1.6 oz (90.3 kg)  Height:        Intake/Output Summary (Last 24 hours) at 03/27/2018 0703 Last data filed at 03/27/2018 0400 Gross per 24 hour  Intake 2738.75 ml  Output 1600 ml  Net 1138.75 ml   Filed Weights   03/25/18 2117 03/26/18 0313 03/27/18 0439  Weight: 190 lb (86.2 kg) 198 lb 1.6 oz (89.9 kg) 199 lb 1.6 oz (90.3 kg)   Physical Exam   General: Well developed, well nourished, NAD Skin: Warm, dry, intact  Head: Normocephalic, atraumatic, clear, moist mucus membranes. Neck: Negative for carotid bruits. No JVD Lungs:Clear to ausculation bilaterally. No wheezes, rales, or rhonchi. Breathing is unlabored. Cardiovascular: RRR with S1 S2. No murmurs, rubs or gallops Abdomen: Soft, non-tender, non-distended with normoactive bowel sounds. No hepatomegaly, No rebound/guarding. No obvious abdominal masses. MSK: Strength  and tone appear normal for age. 5/5 in all extremities Extremities: No edema. No clubbing or cyanosis. DP/PT pulses 2+ bilaterally Neuro: Alert and oriented. No focal deficits. No facial asymmetry. MAE spontaneously. Psych: Responds to questions appropriately with normal affect.    Labs    Chemistry Recent Labs  Lab 03/25/18 2221 03/26/18 0443 03/27/18 0416  NA 137 138 140  K 4.7 5.6* 4.0  CL 103 104 107  CO2 27 22 24   GLUCOSE 119* 94 84  BUN 17 14 14   CREATININE 1.11* 0.99 1.00  CALCIUM 8.7* 8.6* 8.8*  PROT 7.2  --   --   ALBUMIN 3.7  --   --   AST 30  --   --   ALT 19  --   --   ALKPHOS 50  --   --   BILITOT 0.5  --   --   GFRNONAA 48* 55* 55*  GFRAA 56* >60 >60  ANIONGAP 7 12 9      Hematology Recent Labs  Lab 03/25/18 2221 03/26/18 0443 03/27/18 0416  WBC 7.2 6.1 4.9  RBC 3.66* 3.21* 3.35*  HGB 11.6* 10.2* 10.5*  HCT 35.3* 31.9* 33.4*  MCV 96.4 99.4 99.7  MCH 31.7 31.8 31.3  MCHC 32.9 32.0 31.4  RDW 12.3 12.8 13.0  PLT 155 116* 166   Cardiac Enzymes Recent Labs  Lab 03/25/18 2221 03/26/18 0443 03/26/18  1025 03/26/18 1530  TROPONINI 0.08* 0.19* 0.20* 0.18*   No results for input(s): TROPIPOC in the last 168 hours.   BNPNo results for input(s): BNP, PROBNP in the last 168 hours.   DDimer  Recent Labs  Lab 03/25/18 2221  DDIMER 1.22*     Radiology    Dg Chest 2 View  Result Date: 03/25/2018 CLINICAL DATA:  Bilateral leg weakness. EXAM: CHEST - 2 VIEW COMPARISON:  None. FINDINGS: The lungs are clear without focal pneumonia, edema, pneumothorax or pleural effusion. The cardiopericardial silhouette is within normal limits for size. Hiatal hernia noted. The visualized bony structures of the thorax are intact. Telemetry leads overlie the chest. IMPRESSION: No active cardiopulmonary disease. Electronically Signed   By: Misty Stanley M.D.   On: 03/25/2018 23:34   Ct Angio Chest Pe W And/or Wo Contrast  Result Date: 03/26/2018 CLINICAL DATA:  Shortness  of breath EXAM: CT ANGIOGRAPHY CHEST WITH CONTRAST TECHNIQUE: Multidetector CT imaging of the chest was performed using the standard protocol during bolus administration of intravenous contrast. Multiplanar CT image reconstructions and MIPs were obtained to evaluate the vascular anatomy. CONTRAST:  16mL ISOVUE-370 IOPAMIDOL (ISOVUE-370) INJECTION 76% COMPARISON:  None. FINDINGS: Cardiovascular: Heart size upper normal to mildly increased. No pericardial effusion. Atherosclerotic calcification is noted in the wall of the thoracic aorta. No filling defect within the opacified pulmonary arteries to suggest the presence of an acute pulmonary embolus. Mediastinum/Nodes: No mediastinal lymphadenopathy. There is no hilar lymphadenopathy. The esophagus has normal imaging features. There is no axillary lymphadenopathy. Lungs/Pleura: The central tracheobronchial airways are patent. 3 mm subpleural nodule noted right lung (series 6: Image 25). Left lower lobe atelectasis noted. Upper Abdomen: Large hiatal hernia without evidence for gastric outlet obstruction. Bilateral renal cysts measure up to 2.8 cm in the upper pole the left kidney. Neither kidney has been incompletely visualized. Musculoskeletal: No worrisome lytic or sclerotic osseous abnormality. Review of the MIP images confirms the above findings. IMPRESSION: 1. No CT evidence for acute pulmonary embolus. 2. 3 mm right lung nodule. No follow-up needed if patient is low-risk. Non-contrast chest CT can be considered in 12 months if patient is high-risk. This recommendation follows the consensus statement: Guidelines for Management of Incidental Pulmonary Nodules Detected on CT Images: From the Fleischner Society 2017; Radiology 2017; 284:228-243. 3. Large hiatal hernia. Electronically Signed   By: Misty Stanley M.D.   On: 03/26/2018 00:21   Telemetry    03/27/18 NSR HR 68- Personally Reviewed  ECG    No new tracing as of 03/27/18- Personally Reviewed  Cardiac  Studies   Echocardiogram: 12/23/2016 Study Conclusions  - Left ventricle: The cavity size was normal. Systolic function was normal. The estimated ejection fraction was in the range of 60% to 65%. Doppler parameters are consistent with abnormal left ventricular relaxation (grade 1 diastolic dysfunction). Doppler parameters are consistent with high ventricular filling pressure. - Aortic valve: There was moderate regurgitation. - Mitral valve: There was moderate to severe regurgitation.  Patient Profile     72 y.o. female past medical history of PAD (s/p right femoral artery bypass 10+ years ago), HTN, chronic anemia, and prior CVA who is being seen today for the evaluation of chest pain and elevated troponin values at the request of Dr. Myna Hidalgo.   Assessment & Plan    1. Chest Pain with elevated Troponin: -Denies chest pain this AM  -Patient presented for evaluation of new onset exertional chest heaviness with associated dyspnea and diaphoresis found to  have an elevated troponin at 0.08 with a repeat trending upward to 0.19>0.20>0.18. -EKG with no acute ST changes -D-dimer noted to be elevated however, CTA negative for PE -Heparin drip per pharmacy -Multiple risk factors including HTN, HLD and PVD>> plan for cardiac catheterization today 03/27/18 given elevated troponin for definitive evaluation -Continue ASA, Plavix and BB therapy -Noted to be intolerant to statins  2. Hx of PAD: -Status post right femoral artery bypass 10+ years ago -Denies recent claudication symptoms -Followed by [facility]  3. Prior CVA: -Continue Plavix, intolerant to statin therapy secondary to my lesions  4. HLD: -FLP shows total cholesterol 218, triglycerides 79, HDL 56 and LDL 146 -Consider referral to lipid clinic as an outpatient secondary to intolerance to statin therapy  5. Chronic Normocytic Anemia: -Baseline hemoglobin 10.0-11.0 -Stable, 10.5 today    Signed, Kathyrn Drown  NP-C [facility] Pager: [phone] 03/27/2018, 7:03 AM     For questions or updates, please contact   Please consult [email or web address] for contact info under Cardiology/STEMI.  Attending Note:   The patient was seen and examined.  Agree with assessment and plan as noted above.  Changes made to the above note as needed.  Patient seen and independently examined with Kathyrn Drown, NP.   We discussed all aspects of the encounter. I agree with the assessment and plan as stated above.  1.  Coronary artery disease/non-ST segment elevation myocardial infarction: Presents with an episode of chest pain.  Her troponin level is 0.18.  EKG shows no acute changes.  We discussed heart catheterization.  We discussed the risks, benefits, options of heart catheterization.  She understands and agrees with the procedure   2.  Hyperlipidemia: Patient has been generally intolerant to statin medications.  She will need to be referred to our lipid clinic for consideration for PCSK9 inhibitor   I have spent a total of 40 minutes with patient reviewing hospital  notes , telemetry, EKGs, labs and examining patient as well as establishing an assessment and plan that was discussed with the patient. > 50% of time was spent in direct patient care.    Thayer Headings, Brooke Bonito., MD, [facility] 03/27/2018, 10:03 AM [address],  [place] Pager [phone]

## 2018-03-27 NOTE — Progress Notes (Signed)
TR band removed. Site clean, dry and intact with gauze and tegaderm.

## 2018-03-27 NOTE — Interval H&P Note (Signed)
Cath Lab Visit (complete for each Cath Lab visit)  Clinical Evaluation Leading to the Procedure:   ACS: Yes.    Non-ACS:    Anginal Classification: CCS III  Anti-ischemic medical therapy: No Therapy  Non-Invasive Test Results: No non-invasive testing performed  Prior CABG: No previous CABG      History and Physical Interval Note:  03/27/2018 11:10 AM  Katherine Carrillo  has presented today for surgery, with the diagnosis of cp  The various methods of treatment have been discussed with the patient and family. After consideration of risks, benefits and other options for treatment, the patient has consented to  Procedure(s): LEFT HEART CATH AND CORONARY ANGIOGRAPHY (N/A) as a surgical intervention .  The patient's history has been reviewed, patient examined, no change in status, stable for surgery.  I have reviewed the patient's chart and labs.  Questions were answered to the patient's satisfaction.     Quay Burow

## 2018-03-27 NOTE — H&P (View-Only) (Signed)
Progress Note  Patient Name: Katherine Carrillo Date of Encounter: 03/27/2018  Primary Cardiologist: New to [facility]   Subjective   Denies chest pain this AM. Anticipating cardiac cath for definitive evaluation today, 03/27/2018.  Inpatient Medications    Scheduled Meds: . aspirin EC  81 mg Oral Daily  . carvedilol  12.5 mg Oral BID WC  . clopidogrel  75 mg Oral Daily  . [START ON 03/28/2018] lisinopril  10 mg Oral Daily  . sodium chloride flush  3 mL Intravenous Q12H   Continuous Infusions: . sodium chloride Stopped (03/26/18 1221)  . sodium chloride    . sodium chloride    . heparin 1,000 Units/hr (03/27/18 0106)   PRN Meds: sodium chloride, sodium chloride, acetaminophen, ALPRAZolam, nitroGLYCERIN, ondansetron (ZOFRAN) IV, sodium chloride flush   Vital Signs    Vitals:   03/26/18 1725 03/26/18 1918 03/26/18 2355 03/27/18 0439  BP: (!) 154/84 126/72 118/68 (!) 145/78  Pulse: 77 75 79 66  Resp: 20 18    Temp: 98.8 F (37.1 C) 98.2 F (36.8 C) 98.2 F (36.8 C) 98.6 F (37 C)  TempSrc: Oral Oral Oral Oral  SpO2: 96% 98% 98% 99%  Weight:    199 lb 1.6 oz (90.3 kg)  Height:        Intake/Output Summary (Last 24 hours) at 03/27/2018 0703 Last data filed at 03/27/2018 0400 Gross per 24 hour  Intake 2738.75 ml  Output 1600 ml  Net 1138.75 ml   Filed Weights   03/25/18 2117 03/26/18 0313 03/27/18 0439  Weight: 190 lb (86.2 kg) 198 lb 1.6 oz (89.9 kg) 199 lb 1.6 oz (90.3 kg)   Physical Exam   General: Well developed, well nourished, NAD Skin: Warm, dry, intact  Head: Normocephalic, atraumatic, clear, moist mucus membranes. Neck: Negative for carotid bruits. No JVD Lungs:Clear to ausculation bilaterally. No wheezes, rales, or rhonchi. Breathing is unlabored. Cardiovascular: RRR with S1 S2. No murmurs, rubs or gallops Abdomen: Soft, non-tender, non-distended with normoactive bowel sounds. No hepatomegaly, No rebound/guarding. No obvious abdominal masses. MSK: Strength  and tone appear normal for age. 5/5 in all extremities Extremities: No edema. No clubbing or cyanosis. DP/PT pulses 2+ bilaterally Neuro: Alert and oriented. No focal deficits. No facial asymmetry. MAE spontaneously. Psych: Responds to questions appropriately with normal affect.    Labs    Chemistry Recent Labs  Lab 03/25/18 2221 03/26/18 0443 03/27/18 0416  NA 137 138 140  K 4.7 5.6* 4.0  CL 103 104 107  CO2 27 22 24   GLUCOSE 119* 94 84  BUN 17 14 14   CREATININE 1.11* 0.99 1.00  CALCIUM 8.7* 8.6* 8.8*  PROT 7.2  --   --   ALBUMIN 3.7  --   --   AST 30  --   --   ALT 19  --   --   ALKPHOS 50  --   --   BILITOT 0.5  --   --   GFRNONAA 48* 55* 55*  GFRAA 56* >60 >60  ANIONGAP 7 12 9      Hematology Recent Labs  Lab 03/25/18 2221 03/26/18 0443 03/27/18 0416  WBC 7.2 6.1 4.9  RBC 3.66* 3.21* 3.35*  HGB 11.6* 10.2* 10.5*  HCT 35.3* 31.9* 33.4*  MCV 96.4 99.4 99.7  MCH 31.7 31.8 31.3  MCHC 32.9 32.0 31.4  RDW 12.3 12.8 13.0  PLT 155 116* 166   Cardiac Enzymes Recent Labs  Lab 03/25/18 2221 03/26/18 0443 03/26/18  1025 03/26/18 1530  TROPONINI 0.08* 0.19* 0.20* 0.18*   No results for input(s): TROPIPOC in the last 168 hours.   BNPNo results for input(s): BNP, PROBNP in the last 168 hours.   DDimer  Recent Labs  Lab 03/25/18 2221  DDIMER 1.22*     Radiology    Dg Chest 2 View  Result Date: 03/25/2018 CLINICAL DATA:  Bilateral leg weakness. EXAM: CHEST - 2 VIEW COMPARISON:  None. FINDINGS: The lungs are clear without focal pneumonia, edema, pneumothorax or pleural effusion. The cardiopericardial silhouette is within normal limits for size. Hiatal hernia noted. The visualized bony structures of the thorax are intact. Telemetry leads overlie the chest. IMPRESSION: No active cardiopulmonary disease. Electronically Signed   By: Misty Stanley M.D.   On: 03/25/2018 23:34   Ct Angio Chest Pe W And/or Wo Contrast  Result Date: 03/26/2018 CLINICAL DATA:  Shortness  of breath EXAM: CT ANGIOGRAPHY CHEST WITH CONTRAST TECHNIQUE: Multidetector CT imaging of the chest was performed using the standard protocol during bolus administration of intravenous contrast. Multiplanar CT image reconstructions and MIPs were obtained to evaluate the vascular anatomy. CONTRAST:  75mL ISOVUE-370 IOPAMIDOL (ISOVUE-370) INJECTION 76% COMPARISON:  None. FINDINGS: Cardiovascular: Heart size upper normal to mildly increased. No pericardial effusion. Atherosclerotic calcification is noted in the wall of the thoracic aorta. No filling defect within the opacified pulmonary arteries to suggest the presence of an acute pulmonary embolus. Mediastinum/Nodes: No mediastinal lymphadenopathy. There is no hilar lymphadenopathy. The esophagus has normal imaging features. There is no axillary lymphadenopathy. Lungs/Pleura: The central tracheobronchial airways are patent. 3 mm subpleural nodule noted right lung (series 6: Image 25). Left lower lobe atelectasis noted. Upper Abdomen: Large hiatal hernia without evidence for gastric outlet obstruction. Bilateral renal cysts measure up to 2.8 cm in the upper pole the left kidney. Neither kidney has been incompletely visualized. Musculoskeletal: No worrisome lytic or sclerotic osseous abnormality. Review of the MIP images confirms the above findings. IMPRESSION: 1. No CT evidence for acute pulmonary embolus. 2. 3 mm right lung nodule. No follow-up needed if patient is low-risk. Non-contrast chest CT can be considered in 12 months if patient is high-risk. This recommendation follows the consensus statement: Guidelines for Management of Incidental Pulmonary Nodules Detected on CT Images: From the Fleischner Society 2017; Radiology 2017; 284:228-243. 3. Large hiatal hernia. Electronically Signed   By: Misty Stanley M.D.   On: 03/26/2018 00:21   Telemetry    03/27/18 NSR HR 68- Personally Reviewed  ECG    No new tracing as of 03/27/18- Personally Reviewed  Cardiac  Studies   Echocardiogram: 12/23/2016 Study Conclusions  - Left ventricle: The cavity size was normal. Systolic function was normal. The estimated ejection fraction was in the range of 60% to 65%. Doppler parameters are consistent with abnormal left ventricular relaxation (grade 1 diastolic dysfunction). Doppler parameters are consistent with high ventricular filling pressure. - Aortic valve: There was moderate regurgitation. - Mitral valve: There was moderate to severe regurgitation.  Patient Profile     72 y.o. female past medical history of PAD (s/p right femoral artery bypass 10+ years ago), HTN, chronic anemia, and prior CVA who is being seen today for the evaluation of chest pain and elevated troponin values at the request of Dr. Myna Hidalgo.   Assessment & Plan    1. Chest Pain with elevated Troponin: -Denies chest pain this AM  -Patient presented for evaluation of new onset exertional chest heaviness with associated dyspnea and diaphoresis found to  have an elevated troponin at 0.08 with a repeat trending upward to 0.19>0.20>0.18. -EKG with no acute ST changes -D-dimer noted to be elevated however, CTA negative for PE -Heparin drip per pharmacy -Multiple risk factors including HTN, HLD and PVD>> plan for cardiac catheterization today 03/27/18 given elevated troponin for definitive evaluation -Continue ASA, Plavix and BB therapy -Noted to be intolerant to statins  2. Hx of PAD: -Status post right femoral artery bypass 10+ years ago -Denies recent claudication symptoms -Followed by [facility]  3. Prior CVA: -Continue Plavix, intolerant to statin therapy secondary to my lesions  4. HLD: -FLP shows total cholesterol 218, triglycerides 79, HDL 56 and LDL 146 -Consider referral to lipid clinic as an outpatient secondary to intolerance to statin therapy  5. Chronic Normocytic Anemia: -Baseline hemoglobin 10.0-11.0 -Stable, 10.5 today    Signed, Kathyrn Drown  NP-C [facility] Pager: [phone] 03/27/2018, 7:03 AM     For questions or updates, please contact   Please consult [email or web address] for contact info under Cardiology/STEMI.  Attending Note:   The patient was seen and examined.  Agree with assessment and plan as noted above.  Changes made to the above note as needed.  Patient seen and independently examined with Kathyrn Drown, NP.   We discussed all aspects of the encounter. I agree with the assessment and plan as stated above.  1.  Coronary artery disease/non-ST segment elevation myocardial infarction: Presents with an episode of chest pain.  Her troponin level is 0.18.  EKG shows no acute changes.  We discussed heart catheterization.  We discussed the risks, benefits, options of heart catheterization.  She understands and agrees with the procedure   2.  Hyperlipidemia: Patient has been generally intolerant to statin medications.  She will need to be referred to our lipid clinic for consideration for PCSK9 inhibitor   I have spent a total of 40 minutes with patient reviewing hospital  notes , telemetry, EKGs, labs and examining patient as well as establishing an assessment and plan that was discussed with the patient. > 50% of time was spent in direct patient care.    Thayer Headings, Brooke Bonito., MD, [facility] 03/27/2018, 10:03 AM [address]  [place] Pager [phone]

## 2018-03-27 NOTE — Progress Notes (Signed)
[place] TEAM 1 - Stepdown/ICU TEAM  Cleon Gustin  FYB:[id number] DOB: [birth year] DOA: 03/25/2018 PCP: Velna Ochs, MD    Brief Narrative:  72 y.o. 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   In the ED EKG featured a sinus rhythm with LVH.  Chest x-ray was negative for acute cardiopulmonary disease.  D-dimer was elevated to 1.22 and CTa chest was negative for acute PE.  Troponin was elevated to 0.08.   Subjective: Resting comfortably.  Denies any further chest pain since admission.  Denies n/v, abdom pain, sob, or HA.  Has not yet been up ambulating out of bed.  Lives w/ her daughter in [place].  Assessment & Plan:  Chest pain Cardiac cath 8/5 revealed normal coronary arteries - pain has resolved - ?pain due to panic attack - no PE on CTa chest - sx not suggestive of GI source   PAD No evidence of acute complications   Mild renal insufficiency Resolved w/ crt now normal   Recent Labs  Lab 03/25/18 2221 03/26/18 0443 03/27/18 0416  CREATININE 1.11* 0.99 1.00    History of CVA  3 mm right lung nodule Incidentally noted on CTa chest - suggest f/u CT in 12 months for monitoring   DVT prophylaxis: lovenox Code Status: FULL CODE Family Communication: spoke w/ daughter at bedside at length   Disposition Plan: transfer to med/surg - ambulate - PT/OT - anticipate d/c home in AM if no difficulty encountered over night   Consultants:  Cardiology  Antimicrobials:  none  Objective: Blood pressure (!) 166/72, pulse 79, temperature 98 F (36.7 C), temperature source Oral, resp. rate 19, height 5\' 1"  (1.549 m), weight 90.3 kg (199 lb 1.6 oz), SpO2 99 %.  Intake/Output Summary (Last 24 hours) at 03/27/2018 1503 Last data filed at 03/27/2018 0710 Gross per 24 hour  Intake 2130.67 ml  Output 800 ml  Net  1330.67 ml   Filed Weights   03/25/18 2117 03/26/18 0313 03/27/18 0439  Weight: 86.2 kg (190 lb) 89.9 kg (198 lb 1.6 oz) 90.3 kg (199 lb 1.6 oz)    Examination: General: No acute respiratory distress Lungs: Clear to auscultation bilaterally without wheezes or crackles Cardiovascular: Regular rate and rhythm without murmur gallop or rub normal S1 and S2 Abdomen: Nontender, nondistended, soft, bowel sounds positive, no rebound, no ascites, no appreciable mass Extremities: No significant cyanosis, clubbing, or edema bilateral lower extremities  CBC: Recent Labs  Lab 03/25/18 2221 03/26/18 0443 03/27/18 0416  WBC 7.2 6.1 4.9  NEUTROABS 5.8  --   --   HGB 11.6* 10.2* 10.5*  HCT 35.3* 31.9* 33.4*  MCV 96.4 99.4 99.7  PLT 155 116* 527   Basic Metabolic Panel: Recent Labs  Lab 03/25/18 2221 03/26/18 0443 03/27/18 0416  NA 137 138 140  K 4.7 5.6* 4.0  CL 103 104 107  CO2 27 22 24   GLUCOSE 119* 94 84  BUN 17 14 14   CREATININE 1.11* 0.99 1.00  CALCIUM 8.7* 8.6* 8.8*   GFR: Estimated Creatinine Clearance: 51.3 mL/min (by C-G formula based on SCr of 1 mg/dL).  Liver Function Tests: Recent Labs  Lab 03/25/18 2221  AST 30  ALT 19  ALKPHOS 50  BILITOT 0.5  PROT 7.2  ALBUMIN 3.7    Cardiac Enzymes: Recent Labs  Lab 03/25/18 2221 03/26/18 0443 03/26/18  1025 03/26/18 1530  TROPONINI 0.08* 0.19* 0.20* 0.18*     Recent Results (from the past 240 hour(s))  MRSA PCR Screening     Status: None   Collection Time: 03/26/18  3:12 AM  Result Value Ref Range Status   MRSA by PCR NEGATIVE NEGATIVE Final    Comment:        The GeneXpert MRSA Assay (FDA approved for NASAL specimens only), is one component of a comprehensive MRSA colonization surveillance program. It is not intended to diagnose MRSA infection nor to guide or monitor treatment for MRSA infections. Performed at [facility], [place] [address] [place],  [address]      Scheduled Meds: .  [START ON 03/28/2018] aspirin  81 mg Oral Daily  . carvedilol  12.5 mg Oral BID WC  . clopidogrel  75 mg Oral Daily  . [START ON 03/28/2018] lisinopril  10 mg Oral Daily  . sodium chloride flush  3 mL Intravenous Q12H    LOS: 1 day   Cherene Altes, MD [facility] Office  [phone] Pager - Text Page per Amion  If 7PM-7AM, please contact night-coverage per Amion 03/27/2018, 3:03 PM

## 2018-03-27 NOTE — Plan of Care (Signed)

## 2018-03-28 DIAGNOSIS — I214 Non-ST elevation (NSTEMI) myocardial infarction: Secondary | ICD-10-CM | POA: Diagnosis not present

## 2018-03-28 DIAGNOSIS — R079 Chest pain, unspecified: Secondary | ICD-10-CM | POA: Diagnosis present

## 2018-03-28 LAB — CBC
HEMATOCRIT: 30.8 % — AB (ref 36.0–46.0)
Hemoglobin: 10 g/dL — ABNORMAL LOW (ref 12.0–15.0)
MCH: 31.9 pg (ref 26.0–34.0)
MCHC: 32.5 g/dL (ref 30.0–36.0)
MCV: 98.4 fL (ref 78.0–100.0)
Platelets: 155 10*3/uL (ref 150–400)
RBC: 3.13 MIL/uL — ABNORMAL LOW (ref 3.87–5.11)
RDW: 12.8 % (ref 11.5–15.5)
WBC: 4.9 10*3/uL (ref 4.0–10.5)

## 2018-03-28 LAB — BASIC METABOLIC PANEL
ANION GAP: 10 (ref 5–15)
BUN: 15 mg/dL (ref 8–23)
CALCIUM: 8.7 mg/dL — AB (ref 8.9–10.3)
CO2: 24 mmol/L (ref 22–32)
Chloride: 107 mmol/L (ref 98–111)
Creatinine, Ser: 0.9 mg/dL (ref 0.44–1.00)
GFR calc Af Amer: >60 mL/min (ref >60)
GFR calc non Af Amer: >60 mL/min (ref >60)
GLUCOSE: 89 mg/dL (ref 70–99)
Potassium: 4.1 mmol/L (ref 3.5–5.1)
Sodium: 141 mmol/L (ref 135–145)

## 2018-03-28 NOTE — Evaluation (Signed)
Physical Therapy Evaluation Patient Details Name: Katherine Carrillo MRN: [id number] DOB: Sep 02, 1945 Today's Date: 03/28/2018   History of Present Illness  72 y.o. 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  Clinical Impression  Pt presents with balance deficits and decreased activity tolerance and will benefit from skilled PT to address deficits and improve functional mobility for safe d/c home.    Follow Up Recommendations Home health PT    Equipment Recommendations  None recommended by PT    Recommendations for Other Services       Precautions / Restrictions Precautions Precautions: None Restrictions Weight Bearing Restrictions: No      Mobility  Bed Mobility Overal bed mobility: Modified Independent             General bed mobility comments: increased time  Transfers Overall transfer level: Needs assistance Equipment used: Straight cane Transfers: Sit to/from Stand;Stand Pivot Transfers Sit to Stand: Supervision Stand pivot transfers: Supervision       General transfer comment: pt supervision for safety with transfers  Ambulation/Gait Ambulation/Gait assistance: Supervision Gait Distance (Feet): 60 Feet Assistive device: Straight cane       General Gait Details: pt with slow cadence, limited by decreased cardio endurance  Stairs            Wheelchair Mobility    Modified Rankin (Stroke Patients Only)       Balance Overall balance assessment: Needs assistance           Standing balance-Leahy Scale: Fair Standing balance comment: initated Berg balance test, pt unable to complete due to fatigue. pt supervision for narrow BOS, EO/EC, turning sideways                             Pertinent Vitals/Pain Pain Assessment: No/denies pain    Home Living  Family/patient expects to be discharged to:: Private residence Living Arrangements: Children Available Help at Discharge: Family;Available PRN/intermittently Type of Home: House Home Access: Level entry     Home Layout: Multi-level Home Equipment: Cane - single point Additional Comments: uses SPC for all mobility    Prior Function Level of Independence: Independent with assistive device(s)         Comments: independent with ADLs, daughter helps with IADLs     Hand Dominance        Extremity/Trunk Assessment   Upper Extremity Assessment Upper Extremity Assessment: Generalized weakness    Lower Extremity Assessment Lower Extremity Assessment: Generalized weakness    Cervical / Trunk Assessment Cervical / Trunk Assessment: Kyphotic  Communication   Communication: Expressive difficulties(extra time for word finding)  Cognition Arousal/Alertness: Awake/alert Behavior During Therapy: WFL for tasks assessed/performed Overall Cognitive Status: Within Functional Limits for tasks assessed                                        General Comments      Exercises     Assessment/Plan    PT Assessment Patient needs continued PT services  PT Problem List Decreased strength;Decreased balance;Decreased activity tolerance       PT Treatment Interventions DME instruction;Therapeutic activities;Gait training;Therapeutic exercise;Patient/family education;Stair training;Balance training;Functional mobility training;Neuromuscular re-education;Modalities    PT Goals (Current goals can be found in the  Care Plan section)  Acute Rehab PT Goals Patient Stated Goal: go home tomorrow PT Goal Formulation: With patient Time For Goal Achievement: 04/11/18 Potential to Achieve Goals: Good    Frequency Min 3X/week   Barriers to discharge Decreased caregiver support;Inaccessible home environment      Co-evaluation               AM-PAC PT "6 Clicks" Daily  Activity  Outcome Measure Difficulty turning over in bed (including adjusting bedclothes, sheets and blankets)?: None Difficulty moving from lying on back to sitting on the side of the bed? : None Difficulty sitting down on and standing up from a chair with arms (e.g., wheelchair, bedside commode, etc,.)?: None Help needed moving to and from a bed to chair (including a wheelchair)?: None Help needed walking in hospital room?: A Little Help needed climbing 3-5 steps with a railing? : A Little 6 Click Score: 22    End of Session   Activity Tolerance: Patient tolerated treatment well Patient left: in bed;with call bell/phone within reach Nurse Communication: Mobility status PT Visit Diagnosis: Unsteadiness on feet (R26.81);Muscle weakness (generalized) (M62.81)    Time: 1025-1040 PT Time Calculation (min) (ACUTE ONLY): 15 min   Charges:   PT Evaluation $PT Eval Low Complexity: 1 Low PT Treatments $Therapeutic Activity: 8-22 mins        Isabelle Course, PT, DPT  DONAWERTH,KAREN 03/28/2018, 11:01 AM

## 2018-03-28 NOTE — Discharge Summary (Signed)
DISCHARGE SUMMARY  Katherine Carrillo  MR#: [id number]  DOB:08/06/1946  Date of Admission: 03/25/2018 Date of Discharge: 03/28/2018  Attending Physician:Jeffrey Hennie Duos, MD  Patient's VXB:LTJQ, Katherine Gauze, PA-C  Consults:  [facility] Card[facility]  Disposition: D/C home   Follow-up Appts: Follow-up Information    Diamantina Providence H, PA-C Follow up.   Specialty:  Physician Assistant Why:  call office to schedule follow up appointment in 1 week Contact information: [address] [place] [address] [phone]           Tests Needing Follow-up: -assess Hgb and stool habits - pt was noting some dark stools at time of d/c, but Hgb was stable (and she was previously on Fe which could explain)  Discharge Diagnoses: Chest pain PAD Mild renal insufficiency History of CVA 3 mm right lung nodule   Initial presentation: 72 y.o.femalew/ ahx of stroke with mild residual expressive aphasia and dysarthria, PAD s/p femoral artery bypass on the right, and occasional panic attacks who presented with chest pressure, palpitations, and diaphoresis.   In the ED EKG featured a sinus rhythm with LVH. Chest x-ray was negative for acute cardiopulmonary disease. D-dimer was elevated to 1.22 and CTa chest was negative for acute PE. Troponin was elevated to 0.08.   Hospital Course:  Chest pain Cardiac cath 8/5 revealed normal coronary arteries - pain resolved - ?pain due to panic attack - no PE on CTa chest - sx not suggestive of GI source   PAD No evidence of acute complications   Mild renal insufficiency Resolved w/ crt normal at time of d/c   History of CVA  3 mm right lung nodule Incidentally noted on CTa chest - pt is a never smoker and therefore low risk, such that no follow up is recommended   Allergies as of 03/28/2018      Reactions   Oxycontin [oxycodone] Other (See Comments)   Pt states it makes her "crazy"   Caffeine Anxiety   Codeine Anxiety      Medication  List    TAKE these medications   carvedilol 12.5 MG tablet Commonly known as:  COREG Take 12.5 mg by mouth 2 (two) times daily with a meal.   clopidogrel 75 MG tablet Commonly known as:  PLAVIX Take 1 tablet (75 mg total) by mouth daily.   ferrous sulfate 325 (65 FE) MG EC tablet Take 325 mg by mouth daily.   Fish Oil 1000 MG Caps Take 1,000 mg by mouth at bedtime.   GARLIC PO Take 1 tablet by mouth daily.   lisinopril 10 MG tablet Commonly known as:  PRINIVIL,ZESTRIL Take 10 mg by mouth daily.   multivitamin with minerals Tabs tablet Take 1 tablet by mouth daily.   Propylene Glycol 0.6 % Soln Place 1 drop into both eyes daily.   vitamin C 500 MG tablet Commonly known as:  ASCORBIC ACID Take 500 mg by mouth daily.   Vitamin D (Ergocalciferol) 50000 units Caps capsule Commonly known as:  DRISDOL Take 50,000 Units by mouth every 7 (seven) days.       Day of Discharge BP (!) 139/92   Pulse 92   Temp 98.9 F (37.2 C) (Oral)   Resp 19   Ht 5\' 1"  (1.549 m)   Wt 89.1 kg (196 lb 8 oz)   SpO2 98%   BMI 37.13 kg/m   Physical Exam: General: No acute respiratory distress Lungs: Clear to auscultation bilaterally without wheezes or crackles Cardiovascular: Regular rate  and rhythm without murmur gallop or rub normal S1 and S2 Abdomen: Nontender, nondistended, soft, bowel sounds positive, no rebound, no ascites, no appreciable mass Extremities: No significant cyanosis, clubbing, or edema bilateral lower extremities  Basic Metabolic Panel: Recent Labs  Lab 03/25/18 2221 03/26/18 0443 03/27/18 0416 03/28/18 0512  NA 137 138 140 141  K 4.7 5.6* 4.0 4.1  CL 103 104 107 107  CO2 27 22 24 24   GLUCOSE 119* 94 84 89  BUN 17 14 14 15   CREATININE 1.11* 0.99 1.00 0.90  CALCIUM 8.7* 8.6* 8.8* 8.7*    Liver Function Tests: Recent Labs  Lab 03/25/18 2221  AST 30  ALT 19  ALKPHOS 50  BILITOT 0.5  PROT 7.2  ALBUMIN 3.7    Coags: Recent Labs  Lab  03/27/18 0416  INR 1.14   CBC: Recent Labs  Lab 03/25/18 2221 03/26/18 0443 03/27/18 0416 03/28/18 0512  WBC 7.2 6.1 4.9 4.9  NEUTROABS 5.8  --   --   --   HGB 11.6* 10.2* 10.5* 10.0*  HCT 35.3* 31.9* 33.4* 30.8*  MCV 96.4 99.4 99.7 98.4  PLT 155 116* 166 155    Cardiac Enzymes: Recent Labs  Lab 03/25/18 2221 03/26/18 0443 03/26/18 1025 03/26/18 1530  TROPONINI 0.08* 0.19* 0.20* 0.18*    Recent Results (from the past 240 hour(s))  MRSA PCR Screening     Status: None   Collection Time: 03/26/18  3:12 AM  Result Value Ref Range Status   MRSA by PCR NEGATIVE NEGATIVE Final    Comment:        The GeneXpert MRSA Assay (FDA approved for NASAL specimens only), is one component of a comprehensive MRSA colonization surveillance program. It is not intended to diagnose MRSA infection nor to guide or monitor treatment for MRSA infections. Performed at [facility], [place] [address] [place], [place] [address]      Time spent in discharge (includes decision making & examination of pt): 30 minutes  03/28/2018, 1:47 PM   Cherene Altes, MD [facility] Office  [phone] Pager [phone]  On-Call/Text Page:      [email or web address]      password [facility]

## 2018-03-28 NOTE — Care Management Note (Signed)
Case Management Note  Patient Details  Name: [name] MRN: [id number] Date of Birth: 1946/07/25  Subjective/Objective:   Chest pain                 Action/Plan: NCM spoke to pt and dtr, [name] via phone. She at home with dtr. Pt is independent at home. Dtr takes her to appts. She has a cane. She can afford her meds. Her PCP, Loretha Stapler PA.   Expected Discharge Date:  03/27/18               Expected Discharge Plan:  Home/Self Care  In-House Referral:  NA  Discharge planning Services     Post Acute Care Choice:  NA Choice offered to:  NA  DME Arranged:  N/A DME Agency:  NA  HH Arranged:  NA HH Agency:  NA  Status of Service:  Completed, signed off  If discussed at [place] of Stay Meetings, dates discussed:    Additional Comments:  Erenest Rasher, RN 03/28/2018, 11:28 AM

## 2018-03-28 NOTE — Discharge Instructions (Signed)
Panic Attacks Panic attacks are sudden, short feelings of great fear or discomfort. You may have them for no reason when you are relaxed, when you are uneasy (anxious), or when you are sleeping. Follow these instructions at home:  Take all your medicines as told.  Check with your doctor before starting new medicines.  Keep all doctor visits. Contact a doctor if:  You are not able to take your medicines as told.  Your symptoms do not get better.  Your symptoms get worse. Get help right away if:  Your attacks seem different than your normal attacks.  You have thoughts about hurting yourself or others.  You take panic attack medicine and you have a side effect. This information is not intended to replace advice given to you by your health care provider. Make sure you discuss any questions you have with your health care provider. Document Released: 09/11/2010 Document Revised: 01/15/2016 Document Reviewed: 03/23/2013 Elsevier Interactive Patient Education  2017 Elsevier Inc.  

## 2018-03-28 NOTE — Care Management Obs Status (Signed)
[place] NOTIFICATION   Patient Details  Name: Darenda Fike MRN: [id number] Date of Birth: 1946/05/03   Medicare Observation Status Notification Given:  Yes    Erenest Rasher, RN 03/28/2018, 11:18 AM

## 2018-03-28 NOTE — Progress Notes (Addendum)
Progress Note  Patient Name: Katherine Carrillo Date of Encounter: 03/28/2018  Primary Cardiologist: New to [facility]   Subjective   Pt feeling well today. Normal cath yesterday 03/27/18. Denies chest pain, SOB, palpitations.   She notes that her stool was very dark today - she has had several episodes of diarrhea.   Inpatient Medications    Scheduled Meds: . aspirin  81 mg Oral Daily  . carvedilol  12.5 mg Oral BID WC  . clopidogrel  75 mg Oral Daily  . lisinopril  10 mg Oral Daily  . sodium chloride flush  3 mL Intravenous Q12H   Continuous Infusions: . sodium chloride Stopped (03/26/18 1221)   PRN Meds: sodium chloride, acetaminophen, ALPRAZolam, nitroGLYCERIN, ondansetron (ZOFRAN) IV, sodium chloride flush   Vital Signs    Vitals:   03/27/18 2213 03/28/18 0421 03/28/18 0421 03/28/18 0808  BP: 131/65  134/74 (!) 139/92  Pulse: 74  77 92  Resp:      Temp: 98.5 F (36.9 C)  98.9 F (37.2 C)   TempSrc: Oral  Oral   SpO2: 95%  98%   Weight:  196 lb 8 oz (89.1 kg)    Height:        Intake/Output Summary (Last 24 hours) at 03/28/2018 1044 Last data filed at 03/28/2018 0917 Gross per 24 hour  Intake 483 ml  Output 0 ml  Net 483 ml   Filed Weights   03/26/18 0313 03/27/18 0439 03/28/18 0421  Weight: 198 lb 1.6 oz (89.9 kg) 199 lb 1.6 oz (90.3 kg) 196 lb 8 oz (89.1 kg)    Physical Exam   General: Obese, NAD Skin: Warm, dry, intact  Head: Normocephalic, atraumatic, clear, moist mucus membranes. Neck: Negative for carotid bruits. No JVD Lungs:Clear to ausculation bilaterally. No wheezes, rales, or rhonchi. Breathing is unlabored. Cardiovascular: RRR with S1 S2. No murmurs, rubs or gallops Abdomen: Soft, non-tender, non-distended with normoactive bowel sounds. No obvious abdominal masses. MSK: Strength and tone appear normal for age. 5/5 in all extremities,  Right radial cath site looks good  Extremities: No edema. No clubbing or cyanosis. DP/PT pulses 2+  bilaterally Neuro: Alert and oriented. No focal deficits. No facial asymmetry. MAE spontaneously. Psych: Responds to questions appropriately with normal affect.    Labs    Chemistry Recent Labs  Lab 03/25/18 2221 03/26/18 0443 03/27/18 0416 03/28/18 0512  NA 137 138 140 141  K 4.7 5.6* 4.0 4.1  CL 103 104 107 107  CO2 27 22 24 24   GLUCOSE 119* 94 84 89  BUN 17 14 14 15   CREATININE 1.11* 0.99 1.00 0.90  CALCIUM 8.7* 8.6* 8.8* 8.7*  PROT 7.2  --   --   --   ALBUMIN 3.7  --   --   --   AST 30  --   --   --   ALT 19  --   --   --   ALKPHOS 50  --   --   --   BILITOT 0.5  --   --   --   GFRNONAA 48* 55* 55* >60  GFRAA 56* >60 >60 >60  ANIONGAP 7 12 9 10      Hematology Recent Labs  Lab 03/26/18 0443 03/27/18 0416 03/28/18 0512  WBC 6.1 4.9 4.9  RBC 3.21* 3.35* 3.13*  HGB 10.2* 10.5* 10.0*  HCT 31.9* 33.4* 30.8*  MCV 99.4 99.7 98.4  MCH 31.8 31.3 31.9  MCHC 32.0 31.4 32.5  RDW 12.8 13.0 12.8  PLT 116* 166 155    Cardiac Enzymes Recent Labs  Lab 03/25/18 2221 03/26/18 0443 03/26/18 1025 03/26/18 1530  TROPONINI 0.08* 0.19* 0.20* 0.18*   No results for input(s): TROPIPOC in the last 168 hours.   BNPNo results for input(s): BNP, PROBNP in the last 168 hours.   DDimer  Recent Labs  Lab 03/25/18 2221  DDIMER 1.22*     Radiology    No results found.  Telemetry    03/28/18 - Personally Reviewed  ECG    No new tracings as of 03/28/18 - Personally Reviewed  Cardiac Studies   Echocardiogram: 12/23/2016 Study Conclusions  - Left ventricle: The cavity size was normal. Systolic function was normal. The estimated ejection fraction was in the range of 60% to 65%. Doppler parameters are consistent with abnormal left ventricular relaxation (grade 1 diastolic dysfunction). Doppler parameters are consistent with high ventricular filling pressure. - Aortic valve: There was moderate regurgitation. - Mitral valve: There was moderate to severe  regurgitation.  Cardiac catheterization 03/27/18:  IMPRESSION: Katherine Carrillo has normal coronary arteries, normal filling pressures and normal LV function by 2D echo.  I believe her chest pain was noncardiac.  The sheath was removed and a TR band was placed on the right wrist to achieve patent hemostasis.  The patient left the lab in stable condition.  Patient Profile     72 y.o. female past medical history of PAD (s/p right femoral artery bypass10+ years ago), HTN, chronic anemia, and prior CVAwho is being seen today for the evaluation ofchest pain and elevated troponinvaluesat the request of Dr. Myna Hidalgo.   Assessment & Plan    1. Chest Pain with elevated troponin: -Denies chest pain this AM>>cardiac catheterization with normal coronaries and normal LV function 03/27/18 despite elevated trop 0.19>0.20>0.18 -EKG with no acute ST changes -CTA negative for PE  -Continue ASA 81mg  , Plavix 75mg  and carvedilol 12.5mg   -On lisinopril 10mg   -Noted to be intolerant to statins  2. Hx of PAD: -Status post right femoral artery bypass 10+ years ago -Denies recent claudication symptoms -Followed by [facility]  3. Prior CVA: -Continue Plavix, intolerant to statin therapy secondary to my lesions  4. HLD: -FLP shows total cholesterol 218, triglycerides 79, HDL 56 and LDL 146 -Consider referral to lipid clinic as an outpatient secondary to intolerance to statin therapy  5. Chronic Normocytic Anemia: -Baseline hemoglobin 10.0-11.0 -Stable, 10.0 today  -Per primary team   Signed, Katherine Drown NP-C [facility] Pager: [phone] 03/28/2018, 10:44 AM     For questions or updates, please contact   Please consult [email or web address] for contact info under Cardiology/STEMI.  Attending Note:   The patient was seen and examined.  Agree with assessment and plan as noted above.  Changes made to the above note as needed.  Patient seen and independently examined with Katherine Drown, NP .   We discussed all  aspects of the encounter. I agree with the assessment and plan as stated above.  1.  Chest pain with mild troponin elevation: No explanation for the mild troponin elevation.  Heart catheterization yesterday revealed normal coronary arteries and normal filling pressures.  Cardiology standpoint she should be able to be discharged. We will sign off.  [facility] will sign off.   Medication Recommendations:   Other recommendations (labs, testing, etc):   Follow up as an outpatient:   Follow up with Dr. Acie Fredrickson as needed .   She has normal coronaries.    I  have spent a total of 40 minutes with patient reviewing hospital  notes , telemetry, EKGs, labs and examining patient as well as establishing an assessment and plan that was discussed with the patient. > 50% of time was spent in direct patient care.    Thayer Headings, Brooke Bonito., MD, [facility] 03/28/2018, 11:13 AM [address]  [place] Pager [phone]

## 2018-03-28 NOTE — Care Management CC44 (Signed)
Condition Code 44 Documentation Completed  Patient Details  Name: Katherine Carrillo MRN: [id number] Date of Birth: 1946/03/23   Condition Code 44 given:  Yes Patient signature on Condition Code 44 notice:  Yes Documentation of 2 MD's agreement:  Yes Code 44 added to claim:  Yes    Erenest Rasher, RN 03/28/2018, 11:18 AM

## 2019-09-20 ENCOUNTER — Inpatient Hospital Stay (HOSPITAL_COMMUNITY)
Admission: EM | Admit: 2019-09-20 | Discharge: 2019-09-23 | DRG: 063 | Disposition: A | Discharge status: Home Health | Payer: Medicare Other | Attending: Neurology | Admitting: Neurology

## 2019-09-20 ENCOUNTER — Encounter (HOSPITAL_COMMUNITY): Payer: Self-pay | Admitting: Emergency Medicine

## 2019-09-20 ENCOUNTER — Inpatient Hospital Stay (HOSPITAL_COMMUNITY): Payer: Medicare Other

## 2019-09-20 ENCOUNTER — Emergency Department (HOSPITAL_COMMUNITY): Payer: Medicare Other

## 2019-09-20 DIAGNOSIS — I340 Nonrheumatic mitral (valve) insufficiency: Secondary | ICD-10-CM | POA: Diagnosis not present

## 2019-09-20 DIAGNOSIS — R29705 NIHSS score 5: Secondary | ICD-10-CM | POA: Diagnosis present

## 2019-09-20 DIAGNOSIS — J329 Chronic sinusitis, unspecified: Secondary | ICD-10-CM | POA: Diagnosis present

## 2019-09-20 DIAGNOSIS — Z8541 Personal history of malignant neoplasm of cervix uteri: Secondary | ICD-10-CM

## 2019-09-20 DIAGNOSIS — I63512 Cerebral infarction due to unspecified occlusion or stenosis of left middle cerebral artery: Principal | ICD-10-CM | POA: Diagnosis present

## 2019-09-20 DIAGNOSIS — I129 Hypertensive chronic kidney disease with stage 1 through stage 4 chronic kidney disease, or unspecified chronic kidney disease: Secondary | ICD-10-CM | POA: Diagnosis present

## 2019-09-20 DIAGNOSIS — Z833 Family history of diabetes mellitus: Secondary | ICD-10-CM | POA: Diagnosis not present

## 2019-09-20 DIAGNOSIS — E7800 Pure hypercholesterolemia, unspecified: Secondary | ICD-10-CM | POA: Diagnosis not present

## 2019-09-20 DIAGNOSIS — R4701 Aphasia: Secondary | ICD-10-CM | POA: Diagnosis present

## 2019-09-20 DIAGNOSIS — Z9282 Status post administration of tPA (rtPA) in a different facility within the last 24 hours prior to admission to current facility: Secondary | ICD-10-CM | POA: Diagnosis not present

## 2019-09-20 DIAGNOSIS — R29810 Facial weakness: Secondary | ICD-10-CM | POA: Diagnosis present

## 2019-09-20 DIAGNOSIS — Z7902 Long term (current) use of antithrombotics/antiplatelets: Secondary | ICD-10-CM | POA: Diagnosis not present

## 2019-09-20 DIAGNOSIS — D649 Anemia, unspecified: Secondary | ICD-10-CM | POA: Diagnosis present

## 2019-09-20 DIAGNOSIS — Z9109 Other allergy status, other than to drugs and biological substances: Secondary | ICD-10-CM | POA: Diagnosis not present

## 2019-09-20 DIAGNOSIS — N1831 Chronic kidney disease, stage 3a: Secondary | ICD-10-CM | POA: Diagnosis present

## 2019-09-20 DIAGNOSIS — Z20822 Contact with and (suspected) exposure to covid-19: Secondary | ICD-10-CM | POA: Diagnosis present

## 2019-09-20 DIAGNOSIS — Z79899 Other long term (current) drug therapy: Secondary | ICD-10-CM | POA: Diagnosis not present

## 2019-09-20 DIAGNOSIS — I639 Cerebral infarction, unspecified: Secondary | ICD-10-CM | POA: Diagnosis present

## 2019-09-20 DIAGNOSIS — J0100 Acute maxillary sinusitis, unspecified: Secondary | ICD-10-CM | POA: Diagnosis not present

## 2019-09-20 DIAGNOSIS — Z6838 Body mass index (BMI) 38.0-38.9, adult: Secondary | ICD-10-CM | POA: Diagnosis not present

## 2019-09-20 DIAGNOSIS — E782 Mixed hyperlipidemia: Secondary | ICD-10-CM | POA: Diagnosis present

## 2019-09-20 DIAGNOSIS — Z8673 Personal history of transient ischemic attack (TIA), and cerebral infarction without residual deficits: Secondary | ICD-10-CM | POA: Diagnosis not present

## 2019-09-20 DIAGNOSIS — Z885 Allergy status to narcotic agent status: Secondary | ICD-10-CM

## 2019-09-20 DIAGNOSIS — G459 Transient cerebral ischemic attack, unspecified: Secondary | ICD-10-CM | POA: Diagnosis not present

## 2019-09-20 DIAGNOSIS — I739 Peripheral vascular disease, unspecified: Secondary | ICD-10-CM | POA: Diagnosis present

## 2019-09-20 DIAGNOSIS — I10 Essential (primary) hypertension: Secondary | ICD-10-CM | POA: Diagnosis not present

## 2019-09-20 DIAGNOSIS — I341 Nonrheumatic mitral (valve) prolapse: Secondary | ICD-10-CM | POA: Diagnosis not present

## 2019-09-20 LAB — DIFFERENTIAL
Abs Immature Granulocytes: 0.02 10*3/uL (ref 0.00–0.07)
Basophils Absolute: 0 10*3/uL (ref 0.0–0.1)
Basophils Relative: 1 %
Eosinophils Absolute: 0.1 10*3/uL (ref 0.0–0.5)
Eosinophils Relative: 2 %
Immature Granulocytes: 0 %
Lymphocytes Relative: 15 %
Lymphs Abs: 0.7 10*3/uL (ref 0.7–4.0)
Monocytes Absolute: 0.6 10*3/uL (ref 0.1–1.0)
Monocytes Relative: 13 %
Neutro Abs: 3.5 10*3/uL (ref 1.7–7.7)
Neutrophils Relative %: 69 %

## 2019-09-20 LAB — I-STAT CHEM 8, ED
BUN: 18 mg/dL (ref 8–23)
Calcium, Ion: 1.15 mmol/L (ref 1.15–1.40)
Chloride: 102 mmol/L (ref 98–111)
Creatinine, Ser: 1.1 mg/dL — ABNORMAL HIGH (ref 0.44–1.00)
Glucose, Bld: 114 mg/dL — ABNORMAL HIGH (ref 70–99)
HCT: 36 % (ref 36.0–46.0)
Hemoglobin: 12.2 g/dL (ref 12.0–15.0)
Potassium: 4.5 mmol/L (ref 3.5–5.1)
Sodium: 134 mmol/L — ABNORMAL LOW (ref 135–145)
TCO2: 25 mmol/L (ref 22–32)

## 2019-09-20 LAB — PROTIME-INR
INR: 1 (ref 0.8–1.2)
Prothrombin Time: 13.4 seconds (ref 11.4–15.2)

## 2019-09-20 LAB — COMPREHENSIVE METABOLIC PANEL
ALT: 15 U/L (ref 0–44)
AST: 22 U/L (ref 15–41)
Albumin: 3.7 g/dL (ref 3.5–5.0)
Alkaline Phosphatase: 45 U/L (ref 38–126)
Anion gap: 8 (ref 5–15)
BUN: 14 mg/dL (ref 8–23)
CO2: 23 mmol/L (ref 22–32)
Calcium: 9.1 mg/dL (ref 8.9–10.3)
Chloride: 101 mmol/L (ref 98–111)
Creatinine, Ser: 1.22 mg/dL — ABNORMAL HIGH (ref 0.44–1.00)
GFR calc Af Amer: 51 mL/min — ABNORMAL LOW (ref >60)
GFR calc non Af Amer: 44 mL/min — ABNORMAL LOW (ref >60)
Glucose, Bld: 118 mg/dL — ABNORMAL HIGH (ref 70–99)
Potassium: 4.5 mmol/L (ref 3.5–5.1)
Sodium: 132 mmol/L — ABNORMAL LOW (ref 135–145)
Total Bilirubin: 0.3 mg/dL (ref 0.3–1.2)
Total Protein: 7.2 g/dL (ref 6.5–8.1)

## 2019-09-20 LAB — CBC
HCT: 36.3 % (ref 36.0–46.0)
Hemoglobin: 11.8 g/dL — ABNORMAL LOW (ref 12.0–15.0)
MCH: 32 pg (ref 26.0–34.0)
MCHC: 32.5 g/dL (ref 30.0–36.0)
MCV: 98.4 fL (ref 80.0–100.0)
Platelets: 154 10*3/uL (ref 150–400)
RBC: 3.69 MIL/uL — ABNORMAL LOW (ref 3.87–5.11)
RDW: 12.4 % (ref 11.5–15.5)
WBC: 5 10*3/uL (ref 4.0–10.5)
nRBC: 0 % (ref 0.0–0.2)

## 2019-09-20 LAB — RESPIRATORY PANEL BY RT PCR (FLU A&B, COVID)
Influenza A by PCR: NEGATIVE
Influenza B by PCR: NEGATIVE
SARS Coronavirus 2 by RT PCR: NEGATIVE

## 2019-09-20 LAB — MRSA PCR SCREENING: MRSA by PCR: NEGATIVE

## 2019-09-20 LAB — CBG MONITORING, ED: Glucose-Capillary: 117 mg/dL — ABNORMAL HIGH (ref 70–99)

## 2019-09-20 LAB — APTT: aPTT: 49 seconds — ABNORMAL HIGH (ref 24–36)

## 2019-09-20 MED ORDER — ONDANSETRON HCL 4 MG/2ML IJ SOLN
4.0000 mg | Freq: Once | INTRAMUSCULAR | Status: AC
  Administered 2019-09-20: 4 mg via INTRAVENOUS

## 2019-09-20 MED ORDER — ACETAMINOPHEN 650 MG RE SUPP: 650.0000 mg | RECTAL | Status: DC | PRN

## 2019-09-20 MED ORDER — IOHEXOL 350 MG/ML SOLN
100.0000 mL | Freq: Once | INTRAVENOUS | Status: AC | PRN
  Administered 2019-09-20: 100 mL via INTRAVENOUS

## 2019-09-20 MED ORDER — SENNOSIDES-DOCUSATE SODIUM 8.6-50 MG PO TABS: 1.0000 | ORAL_TABLET | Freq: Every evening | ORAL | Status: DC | PRN

## 2019-09-20 MED ORDER — PANTOPRAZOLE SODIUM 40 MG IV SOLR
40.0000 mg | Freq: Every day | INTRAVENOUS | Status: DC
  Administered 2019-09-20: 40 mg via INTRAVENOUS
  Filled 2019-09-20: qty 40

## 2019-09-20 MED ORDER — ACETAMINOPHEN 325 MG PO TABS
650.0000 mg | ORAL_TABLET | ORAL | Status: DC | PRN
  Administered 2019-09-21 – 2019-09-22 (×3): 650 mg via ORAL
  Filled 2019-09-20 (×4): qty 2

## 2019-09-20 MED ORDER — ACETAMINOPHEN 160 MG/5ML PO SOLN: 650.0000 mg | ORAL | Status: DC | PRN

## 2019-09-20 MED ORDER — LABETALOL HCL 5 MG/ML IV SOLN
20.0000 mg | Freq: Once | INTRAVENOUS | Status: AC
  Administered 2019-09-20: 20 mg via INTRAVENOUS

## 2019-09-20 MED ORDER — STROKE: EARLY STAGES OF RECOVERY BOOK
Freq: Once | Status: AC
  Filled 2019-09-20: qty 1

## 2019-09-20 MED ORDER — ONDANSETRON HCL 4 MG/2ML IJ SOLN
INTRAMUSCULAR | Status: AC
  Filled 2019-09-20: qty 2

## 2019-09-20 MED ORDER — LABETALOL HCL 5 MG/ML IV SOLN: 10.0000 mg | INTRAVENOUS | Status: DC | PRN

## 2019-09-20 MED ORDER — ALTEPLASE (STROKE) FULL DOSE INFUSION
0.9000 mg/kg | Freq: Once | INTRAVENOUS | Status: AC
  Administered 2019-09-20: 85.3 mg via INTRAVENOUS
  Filled 2019-09-20: qty 100

## 2019-09-20 MED ORDER — SODIUM CHLORIDE 0.9 % IV SOLN
50.0000 mL | Freq: Once | INTRAVENOUS | Status: AC
  Administered 2019-09-20: 50 mL via INTRAVENOUS

## 2019-09-20 MED ORDER — SODIUM CHLORIDE 0.9 % IV SOLN: INTRAVENOUS | Status: DC

## 2019-09-20 MED ORDER — SODIUM CHLORIDE 0.9% FLUSH
3.0000 mL | Freq: Once | INTRAVENOUS | Status: AC
  Administered 2019-09-20: 3 mL via INTRAVENOUS

## 2019-09-20 MED ORDER — CLEVIDIPINE BUTYRATE 0.5 MG/ML IV EMUL: 0.0000 mg/h | INTRAVENOUS | Status: DC

## 2019-09-20 NOTE — ED Notes (Signed)
Pt noted to report sudden onset nausea. Pt had episode of emesis and become diaphoretic and incontinent of large amt of stool. Pt now flaccid on the R side w/ slurred speech and minimal responsiveness. MD Erlinda Hong paged immediately and MD Little to bedside for assesment. 4mg  zofran admin for nausea.

## 2019-09-20 NOTE — ED Notes (Signed)
4mg  Zofran admin by Keane Scrape RN. 1L NS Bolus infusing wide open per MD Little verbal order

## 2019-09-20 NOTE — Code Documentation (Signed)
Patient arrives via EMS at 1609  Code Stroke called at 1546  She was last known well at 1200 when she spoke with her daughter on the phone.  Stat labs done stat head ct done.  Dr Erlinda Hong at the bedside to assess the pt nihss 4, unable to state month, facial droop, right arm drift, and dysarthria.  TPA given at 1630.  CTA and CTP done.  NIHSS improved to 3 and dysarthria improved but not clear.  TPA complete at 1730.   She stated she was about to have an "anxiety attack" and became tearful.   At 1745 she suddenly became nauseated and started dry heaving.  Then she had loss of bowel and bladder and became difficult to arouse, right arm was flaccid. BP dropped to 75/44 and 56/29.  NS bolus started.  Stat head CT done.  Patient became more responsive while in CT.  No ICH per Dr Erlinda Hong.  BP improved to 108/83 and 134/74. NIHSS 3 right arm weakness, facial droop and slurred speech.  She is resting comfortably.  Admitted to [facility] ICU   Stroke MD

## 2019-09-20 NOTE — ED Provider Notes (Signed)
[facility] EMERGENCY DEPARTMENT Provider Note   CSN: [id number] Arrival date & time: 09/20/19  1609  An emergency department physician performed an initial assessment on this suspected stroke patient at 1412.  History Chief Complaint  Patient presents with  . Cerebrovascular Accident    Katherine Carrillo is a 74 y.o. female.  74yo F w/ PMH including PVD, TIA, HTN who p/w aphasia. PT reportedly last spoken to by family member over the phone at 12:00 today, when family member noted normal speech. Just PTA, caregiver found pt w/ speech problems and called EMS. EMS noted R facial droop, drooling, and aphasia. Pt has been hypertensive in route. Pt takes plavix, no other anticoagulant use.  LEVEL 5 CAVEAT DUE TO APHASIA  The history is provided by the EMS personnel and a relative.       Past Medical History:  Diagnosis Date  . Anemia   . Cervical cancer ([place])   . Hypertension   . Peripheral vascular disease ([place])   . Stroke [facility])     Patient Active Problem List   Diagnosis Date Noted  . Mixed hyperlipidemia   . Aortic atherosclerosis ([place]) 03/26/2018  . History of transient ischemic attack (TIA) 12/22/2016  . Dysarthria 12/22/2016  . Essential hypertension 12/22/2016  . History of stroke 12/22/2016  . PVD (peripheral vascular disease) ([place]) 12/22/2016    Past Surgical History:  Procedure Laterality Date  . ABDOMINAL HYSTERECTOMY    . CESAREAN SECTION    . FEMORAL ARTERY - FEMORAL ARTERY BYPASS GRAFT    . LEFT HEART CATH AND CORONARY ANGIOGRAPHY N/A 03/27/2018   Procedure: LEFT HEART CATH AND CORONARY ANGIOGRAPHY;  Surgeon: Lorretta Harp, MD;  Location: [place] CV LAB;  Service: Cardiovascular;  Laterality: N/A;  . TONSILLECTOMY       OB History   No obstetric history on file.     Family History  Problem Relation Age of Onset  . Diabetes Sister   . Diabetes Brother     Social History   Tobacco Use  . Smoking status: Never Smoker  .  Smokeless tobacco: Never Used  Substance Use Topics  . Alcohol use: No  . Drug use: No    Home Medications Prior to Admission medications   Medication Sig Start Date End Date Taking? Authorizing Provider  carvedilol (COREG) 12.5 MG tablet Take 12.5 mg by mouth 2 (two) times daily with a meal.    [provider]  clopidogrel (PLAVIX) 75 MG tablet Take 1 tablet (75 mg total) by mouth daily. 12/24/16   Bonnielee Haff, MD  ferrous sulfate 325 (65 FE) MG EC tablet Take 325 mg by mouth daily. 12/12/16   [provider]  GARLIC PO Take 1 tablet by mouth daily.    [provider]  lisinopril (PRINIVIL,ZESTRIL) 10 MG tablet Take 10 mg by mouth daily. 09/25/16   [provider]  Multiple Vitamin (MULTIVITAMIN WITH MINERALS) TABS tablet Take 1 tablet by mouth daily.    [provider]  Omega-3 Fatty Acids (FISH OIL) 1000 MG CAPS Take 1,000 mg by mouth at bedtime.    [provider]  Propylene Glycol 0.6 % SOLN Place 1 drop into both eyes daily.    [provider]  vitamin C (ASCORBIC ACID) 500 MG tablet Take 500 mg by mouth daily.    [provider]  Vitamin D, Ergocalciferol, (DRISDOL) 50000 UNITS CAPS capsule Take 50,000 Units by mouth every 7 (seven) days.  [provider]    Allergies    Oxycontin [oxycodone], Caffeine, and Codeine  Review of Systems   Review of Systems  Unable to perform ROS: Mental status change    Physical Exam Updated Vital Signs BP (!) 175/82   Pulse 72   Resp (!) 25   Wt 94.8 kg   SpO2 99%   BMI 39.49 kg/m   Physical Exam Vitals and nursing note reviewed.  Constitutional:      General: She is not in acute distress.    Appearance: She is well-developed.  HENT:     Head: Normocephalic and atraumatic.  Eyes:     Extraocular Movements: Extraocular movements intact.     Conjunctiva/sclera: Conjunctivae normal.     Pupils: Pupils are equal, round, and reactive to light.    Cardiovascular:     Rate and Rhythm: Normal rate and regular rhythm.     Heart sounds: Normal heart sounds. No murmur.  Pulmonary:     Effort: Pulmonary effort is normal.     Breath sounds: Normal breath sounds.  Abdominal:     General: There is no distension.     Palpations: Abdomen is soft.     Tenderness: There is no abdominal tenderness.  Musculoskeletal:     Cervical back: Neck supple.  Skin:    General: Skin is warm and dry.  Neurological:     Mental Status: She is alert.     Deep Tendon Reflexes: Reflexes normal.     Comments: Alert, garbled speech, R facial droop, mild R arm pronator drift; no clonus     ED Results / Procedures / Treatments   Labs (all labs ordered are listed, but only abnormal results are displayed) Labs Reviewed  APTT - Abnormal; Notable for the following components:      Result Value   aPTT 49 (*)    All other components within normal limits  CBC - Abnormal; Notable for the following components:   RBC 3.69 (*)    Hemoglobin 11.8 (*)    All other components within normal limits  COMPREHENSIVE METABOLIC PANEL - Abnormal; Notable for the following components:   Sodium 132 (*)    Glucose, Bld 118 (*)    Creatinine, Ser 1.22 (*)    GFR calc non Af Amer 44 (*)    GFR calc Af Amer 51 (*)    All other components within normal limits  I-STAT CHEM 8, ED - Abnormal; Notable for the following components:   Sodium 134 (*)    Creatinine, Ser 1.10 (*)    Glucose, Bld 114 (*)    All other components within normal limits  CBG MONITORING, ED - Abnormal; Notable for the following components:   Glucose-Capillary 117 (*)    All other components within normal limits  PROTIME-INR  DIFFERENTIAL    EKG None  Radiology CT ANGIO HEAD W OR WO CONTRAST  Result Date: 09/20/2019 CLINICAL DATA:  74 year old female code stroke presentation with aphasia. EXAM: CT ANGIOGRAPHY HEAD AND NECK CT PERFUSION BRAIN TECHNIQUE: Multidetector CT imaging of the head and neck  was performed using the standard protocol during bolus administration of intravenous contrast. Multiplanar CT image reconstructions and MIPs were obtained to evaluate the vascular anatomy. Carotid stenosis measurements (when applicable) are obtained utilizing NASCET criteria, using the distal internal carotid diameter as the denominator. Multiphase CT imaging of the brain was performed following IV bolus contrast injection. Subsequent parametric perfusion maps were calculated using RAPID software. CONTRAST:  136mL  OMNIPAQUE IOHEXOL 350 MG/ML SOLN COMPARISON:  Plain head CT 1619 hours today. Brain MRI and intracranial MRA 12/23/2016 FINDINGS: CT Brain Perfusion Findings: ASPECTS: 10 CBF (<30%) Volume: None Perfusion (Tmax>6.0s) volume: 3 mL Mismatch Volume: 3 mL Infarction Location:No core infarct detected, but abnormal perfusion is in the left frontal lobe deep white matter, also left frontal operculum (T-max >4s). CTA NECK Skeleton: Absent dentition. Widespread cervical spine disc and endplate degeneration. No acute osseous abnormality identified. Upper chest: Negative. Other neck: Negative. Aortic arch: 3 vessel arch configuration with mild arch atherosclerosis. Right carotid system: Mild brachiocephalic artery plaque without stenosis. Tortuous brachiocephalic artery and proximal right CCA, which has a kinked appearance at the thoracic inlet. No proximal right CCA plaque. Partially retropharyngeal course of the right carotid and the right bifurcation. No cervical right ICA plaque or stenosis. Tortuosity at C1. Left carotid system: Minimal plaque at the left CCA origin. Tortuous proximal left CCA with a partially retropharyngeal course. Negative left carotid bifurcation. Tortuous cervical left ICA without stenosis. Vertebral arteries: Mildly tortuous proximal right subclavian artery with minimal plaque and no stenosis. Mild calcified plaque at the right vertebral artery origin without significant stenosis. The  right vertebral is non dominant and somewhat diminutive but patent to the skull base without stenosis. Mostly soft plaque in the proximal left subclavian artery without significant stenosis. Normal left vertebral artery origin. Tortuous V1 segment with a kinked appearance. Dominant left vertebral artery is patent to the skull base without stenosis. CTA HEAD Posterior circulation: No vertebral artery V4 plaque or stenosis. The left vertebral is dominant. Patent PICA origins and vertebrobasilar junction. Mildly tortuous basilar artery without stenosis. Tortuous basilar tip and fetal left PCA origin. Normal right PCA and SCA origins. Diminutive or absent right posterior communicating artery. The left PCA branches are within normal limits. The right PCA branches appear mildly irregular but stable since 2018. Anterior circulation: Both ICA siphons are patent. On the left there is no plaque or stenosis. Tortuous left posterior communicating artery. On the right there is minor calcified plaque without stenosis. Patent carotid termini. Normal MCA and ACA origins. Diminutive or absent anterior communicating artery. Bilateral ACA branches are within normal limits. Right MCA M1 segment and bifurcation are patent without stenosis. Right MCA branches are stable and within normal limits. Left MCA M1 segment and bifurcation are patent without stenosis. There is a left MCA distal M2/M3 occlusion of a middle branch on series 5, images 78 and 79 and series 10, image 270. This was a patent trifurcation on the 2018 MRA. The occlusion is located 2 centimeters or slightly more from the left MCA bifurcation. The remaining left MCA branches appear stable. Venous sinuses: Early contrast timing, grossly patent. Anatomic variants: Dominant left vertebral artery. Fetal type left PCA origin. Review of the MIP images confirms the above findings IMPRESSION: 1. Positive for a Left MCA distal M2/M3 branch occlusion in the middle division. See  series 10, image 27. 2. No core infarct detected by CTP. A small volume of middle Left MCA division ischemic penumbra is detected (up to 35 milliliters). 3. Otherwise generalized arterial tortuosity in the head and neck with mild for age atherosclerosis. #1 was communicated to Dr. Erlinda Hong at 1658 hours by text page via the [facility] messaging system. Electronically Signed   By: Genevie Ann M.D.   On: 09/20/2019 17:02   CT ANGIO NECK W OR WO CONTRAST  Result Date: 09/20/2019 CLINICAL DATA:  74 year old female code stroke presentation with aphasia. EXAM: CT  ANGIOGRAPHY HEAD AND NECK CT PERFUSION BRAIN TECHNIQUE: Multidetector CT imaging of the head and neck was performed using the standard protocol during bolus administration of intravenous contrast. Multiplanar CT image reconstructions and MIPs were obtained to evaluate the vascular anatomy. Carotid stenosis measurements (when applicable) are obtained utilizing NASCET criteria, using the distal internal carotid diameter as the denominator. Multiphase CT imaging of the brain was performed following IV bolus contrast injection. Subsequent parametric perfusion maps were calculated using RAPID software. CONTRAST:  139mL OMNIPAQUE IOHEXOL 350 MG/ML SOLN COMPARISON:  Plain head CT 1619 hours today. Brain MRI and intracranial MRA 12/23/2016 FINDINGS: CT Brain Perfusion Findings: ASPECTS: 10 CBF (<30%) Volume: None Perfusion (Tmax>6.0s) volume: 3 mL Mismatch Volume: 3 mL Infarction Location:No core infarct detected, but abnormal perfusion is in the left frontal lobe deep white matter, also left frontal operculum (T-max >4s). CTA NECK Skeleton: Absent dentition. Widespread cervical spine disc and endplate degeneration. No acute osseous abnormality identified. Upper chest: Negative. Other neck: Negative. Aortic arch: 3 vessel arch configuration with mild arch atherosclerosis. Right carotid system: Mild brachiocephalic artery plaque without stenosis. Tortuous brachiocephalic artery and  proximal right CCA, which has a kinked appearance at the thoracic inlet. No proximal right CCA plaque. Partially retropharyngeal course of the right carotid and the right bifurcation. No cervical right ICA plaque or stenosis. Tortuosity at C1. Left carotid system: Minimal plaque at the left CCA origin. Tortuous proximal left CCA with a partially retropharyngeal course. Negative left carotid bifurcation. Tortuous cervical left ICA without stenosis. Vertebral arteries: Mildly tortuous proximal right subclavian artery with minimal plaque and no stenosis. Mild calcified plaque at the right vertebral artery origin without significant stenosis. The right vertebral is non dominant and somewhat diminutive but patent to the skull base without stenosis. Mostly soft plaque in the proximal left subclavian artery without significant stenosis. Normal left vertebral artery origin. Tortuous V1 segment with a kinked appearance. Dominant left vertebral artery is patent to the skull base without stenosis. CTA HEAD Posterior circulation: No vertebral artery V4 plaque or stenosis. The left vertebral is dominant. Patent PICA origins and vertebrobasilar junction. Mildly tortuous basilar artery without stenosis. Tortuous basilar tip and fetal left PCA origin. Normal right PCA and SCA origins. Diminutive or absent right posterior communicating artery. The left PCA branches are within normal limits. The right PCA branches appear mildly irregular but stable since 2018. Anterior circulation: Both ICA siphons are patent. On the left there is no plaque or stenosis. Tortuous left posterior communicating artery. On the right there is minor calcified plaque without stenosis. Patent carotid termini. Normal MCA and ACA origins. Diminutive or absent anterior communicating artery. Bilateral ACA branches are within normal limits. Right MCA M1 segment and bifurcation are patent without stenosis. Right MCA branches are stable and within normal limits.  Left MCA M1 segment and bifurcation are patent without stenosis. There is a left MCA distal M2/M3 occlusion of a middle branch on series 5, images 78 and 79 and series 10, image 270. This was a patent trifurcation on the 2018 MRA. The occlusion is located 2 centimeters or slightly more from the left MCA bifurcation. The remaining left MCA branches appear stable. Venous sinuses: Early contrast timing, grossly patent. Anatomic variants: Dominant left vertebral artery. Fetal type left PCA origin. Review of the MIP images confirms the above findings IMPRESSION: 1. Positive for a Left MCA distal M2/M3 branch occlusion in the middle division. See series 10, image 27. 2. No core infarct detected by CTP. A small  volume of middle Left MCA division ischemic penumbra is detected (up to 35 milliliters). 3. Otherwise generalized arterial tortuosity in the head and neck with mild for age atherosclerosis. #1 was communicated to Dr. Erlinda Hong at 1658 hours by text page via the [facility] messaging system. Electronically Signed   By: Genevie Ann M.D.   On: 09/20/2019 17:02   CT CEREBRAL PERFUSION W CONTRAST  Result Date: 09/20/2019 CLINICAL DATA:  74 year old female code stroke presentation with aphasia. EXAM: CT ANGIOGRAPHY HEAD AND NECK CT PERFUSION BRAIN TECHNIQUE: Multidetector CT imaging of the head and neck was performed using the standard protocol during bolus administration of intravenous contrast. Multiplanar CT image reconstructions and MIPs were obtained to evaluate the vascular anatomy. Carotid stenosis measurements (when applicable) are obtained utilizing NASCET criteria, using the distal internal carotid diameter as the denominator. Multiphase CT imaging of the brain was performed following IV bolus contrast injection. Subsequent parametric perfusion maps were calculated using RAPID software. CONTRAST:  178mL OMNIPAQUE IOHEXOL 350 MG/ML SOLN COMPARISON:  Plain head CT 1619 hours today. Brain MRI and intracranial MRA 12/23/2016  FINDINGS: CT Brain Perfusion Findings: ASPECTS: 10 CBF (<30%) Volume: None Perfusion (Tmax>6.0s) volume: 3 mL Mismatch Volume: 3 mL Infarction Location:No core infarct detected, but abnormal perfusion is in the left frontal lobe deep white matter, also left frontal operculum (T-max >4s). CTA NECK Skeleton: Absent dentition. Widespread cervical spine disc and endplate degeneration. No acute osseous abnormality identified. Upper chest: Negative. Other neck: Negative. Aortic arch: 3 vessel arch configuration with mild arch atherosclerosis. Right carotid system: Mild brachiocephalic artery plaque without stenosis. Tortuous brachiocephalic artery and proximal right CCA, which has a kinked appearance at the thoracic inlet. No proximal right CCA plaque. Partially retropharyngeal course of the right carotid and the right bifurcation. No cervical right ICA plaque or stenosis. Tortuosity at C1. Left carotid system: Minimal plaque at the left CCA origin. Tortuous proximal left CCA with a partially retropharyngeal course. Negative left carotid bifurcation. Tortuous cervical left ICA without stenosis. Vertebral arteries: Mildly tortuous proximal right subclavian artery with minimal plaque and no stenosis. Mild calcified plaque at the right vertebral artery origin without significant stenosis. The right vertebral is non dominant and somewhat diminutive but patent to the skull base without stenosis. Mostly soft plaque in the proximal left subclavian artery without significant stenosis. Normal left vertebral artery origin. Tortuous V1 segment with a kinked appearance. Dominant left vertebral artery is patent to the skull base without stenosis. CTA HEAD Posterior circulation: No vertebral artery V4 plaque or stenosis. The left vertebral is dominant. Patent PICA origins and vertebrobasilar junction. Mildly tortuous basilar artery without stenosis. Tortuous basilar tip and fetal left PCA origin. Normal right PCA and SCA origins.  Diminutive or absent right posterior communicating artery. The left PCA branches are within normal limits. The right PCA branches appear mildly irregular but stable since 2018. Anterior circulation: Both ICA siphons are patent. On the left there is no plaque or stenosis. Tortuous left posterior communicating artery. On the right there is minor calcified plaque without stenosis. Patent carotid termini. Normal MCA and ACA origins. Diminutive or absent anterior communicating artery. Bilateral ACA branches are within normal limits. Right MCA M1 segment and bifurcation are patent without stenosis. Right MCA branches are stable and within normal limits. Left MCA M1 segment and bifurcation are patent without stenosis. There is a left MCA distal M2/M3 occlusion of a middle branch on series 5, images 78 and 79 and series 10, image 270. This was a  patent trifurcation on the 2018 MRA. The occlusion is located 2 centimeters or slightly more from the left MCA bifurcation. The remaining left MCA branches appear stable. Venous sinuses: Early contrast timing, grossly patent. Anatomic variants: Dominant left vertebral artery. Fetal type left PCA origin. Review of the MIP images confirms the above findings IMPRESSION: 1. Positive for a Left MCA distal M2/M3 branch occlusion in the middle division. See series 10, image 27. 2. No core infarct detected by CTP. A small volume of middle Left MCA division ischemic penumbra is detected (up to 35 milliliters). 3. Otherwise generalized arterial tortuosity in the head and neck with mild for age atherosclerosis. #1 was communicated to Dr. Erlinda Hong at 1658 hours by text page via the [facility] messaging system. Electronically Signed   By: Genevie Ann M.D.   On: 09/20/2019 17:02   CT HEAD CODE STROKE WO CONTRAST  Result Date: 09/20/2019 CLINICAL DATA:  Code stroke.  Aphasia EXAM: CT HEAD WITHOUT CONTRAST TECHNIQUE: Contiguous axial images were obtained from the base of the skull through the vertex without  intravenous contrast. COMPARISON:  2019 FINDINGS: Brain: There is no acute intracranial hemorrhage, mass effect, or edema. There is no new loss of gray differentiation. Multiple chronic infarcts are again identified including within the right parietal lobe and bilateral cerebellar hemispheres. There is a new age-indeterminate right thalamic infarct. Ventricles and sulci are stable in size with parietal dominant atrophy. Patchy hypoattenuation in the supratentorial white matter is nonspecific but may reflect mild chronic microvascular ischemic changes. There is no extra-axial fluid collection. Vascular: No hyperdense vessel. Skull: Calvarial hyperostosis.  Otherwise unremarkable. Sinuses/Orbits: Trace mucosal thickening. No acute orbital abnormality. Other: Partially empty sella. ASPECTS ([place] Stroke Program Early CT Score) - Ganglionic level infarction (caudate, lentiform nuclei, internal capsule, insula, M1-M3 cortex): 7 - Supraganglionic infarction (M4-M6 cortex): 3 Total score (0-10 with 10 being normal): 10 IMPRESSION: No acute intracranial hemorrhage or evidence of acute infarction. ASPECT score is 10. Age-indeterminate small right thalamic infarct. Multiple small chronic infarcts. These results were communicated to Dr. Erlinda Hong at [place] 1/28/2021by text page via the [facility] messaging system. Electronically Signed   By: Macy Mis M.D.   On: 09/20/2019 16:32    Procedures .Critical Care Performed by: Sharlett Iles, MD Authorized by: Sharlett Iles, MD   Critical care provider statement:    Critical care time (minutes):  30   Critical care time was exclusive of:  Separately billable procedures and treating other patients   Critical care was necessary to treat or prevent imminent or life-threatening deterioration of the following conditions:  CNS failure or compromise   Critical care was time spent personally by me on the following activities:  Development of treatment plan with  patient or surrogate, discussions with consultants, evaluation of patient's response to treatment, examination of patient, obtaining history from patient or surrogate, ordering and performing treatments and interventions, ordering and review of laboratory studies, ordering and review of radiographic studies and re-evaluation of patient's condition   (including critical care time)  Medications Ordered in ED Medications  alteplase (ACTIVASE) 1 mg/mL infusion 85.3 mg (85.3 mg Intravenous New Bag/Given 09/20/19 1630)    Followed by  0.9 %  sodium chloride infusion (has no administration in time range)  clevidipine (CLEVIPREX) infusion 0.5 mg/mL (0 mg/hr Intravenous Hold 09/20/19 1648)  sodium chloride flush (NS) 0.9 % injection 3 mL (3 mLs Intravenous Given 09/20/19 1648)  iohexol (OMNIPAQUE) 350 MG/ML injection 100 mL (100 mLs Intravenous Contrast  Given 09/20/19 1643)  labetalol (NORMODYNE) injection 20 mg (20 mg Intravenous Given 09/20/19 1625)    ED Course  I have reviewed the triage vital signs and the nursing notes.  Pertinent labs & imaging results that were available during my care of the patient were reviewed by me and considered in my medical decision making (see chart for details).    MDM Rules/Calculators/A&P                      Code stroke called in route, pt taken immediately to CT scanner and evaluated by neurology team.  Head CT negative for hemorrhage.  Dr. Erlinda Hong administered tPA given pt just inside 4.5h window. Screening labs unremarkable.  CTA positive for L MCA M2/M3 occlusion. Pt has been ordered cleviprex to maintain BP control and will be admitted to neuro ICU.  Final Clinical Impression(s) / ED Diagnoses Final diagnoses:  Acute CVA (cerebrovascular accident) [facility])    Rx / [place] Orders ED Discharge Orders    None       Amaryah Mallen, Wenda Overland, MD 09/20/19 1711

## 2019-09-20 NOTE — H&P (Signed)
Stroke Neurology H&P Note  The history was obtained from the EMS and pt daughter.  During history and examination, all items were able to obtain unless otherwise noted.  History of Present Illness:  Katherine Carrillo is a 74 y.o. African American female with PMH of stroke, TIA, CAD, HTN, PVD, HLD presented to ER for speech difficulty.   As per daughter, pt had conversation with daughter over the phone at 12pm and she was doing well at that time. Around 3pm, pt granddaughter called pt and found she had difficulty speaking and slurring on her words and not able to understand. Granddaughter called pt daughter who called EMS. On ER arrival, pt was found to have intelligible words, right facial droop and right arm drift. However, followed all simple commands, no weakness of the legs. CT no acute abnormalities. tPA was offered. Pt initially would like daughter to make decision on tPA, however, daughter was not available on multiple attempts of the phone calls. Eventually pt consented herself verbally for tPA.   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 Had TIA in 12/2016 with MRI negative for stroke. LDL 151, discharged with plavix and pitavastatin.   LSN: 12pm today tPA Given: Yes  Past Medical History:  Diagnosis Date  . Anemia   . Cervical cancer ([place])   . Hypertension   . Peripheral vascular disease ([place])   . Stroke [facility])     Past Surgical History:  Procedure Laterality Date  . ABDOMINAL HYSTERECTOMY    . CESAREAN SECTION    . FEMORAL ARTERY - FEMORAL ARTERY BYPASS GRAFT    . LEFT HEART CATH AND CORONARY ANGIOGRAPHY N/A 03/27/2018   Procedure: LEFT HEART CATH AND CORONARY ANGIOGRAPHY;  Surgeon: Lorretta Harp, MD;  Location: [facility];  Service: Cardiovascular;  Laterality: N/A;  . TONSILLECTOMY      Family History  Problem Relation Age of Onset  . Diabetes Sister   . Diabetes Brother     Social History:  reports that she has never  smoked. She has never used smokeless tobacco. She reports that she does not drink alcohol or use drugs.  Allergies:  Allergies  Allergen Reactions  . Oxycontin [Oxycodone] Other (See Comments)    Pt states it makes her "crazy"  . Caffeine Anxiety  . Codeine Anxiety    No current facility-administered medications on file prior to encounter.   Current Outpatient Medications on File Prior to Encounter  Medication Sig Dispense Refill  . carvedilol (COREG) 12.5 MG tablet Take 12.5 mg by mouth 2 (two) times daily with a meal.    . clopidogrel (PLAVIX) 75 MG tablet Take 1 tablet (75 mg total) by mouth daily. 30 tablet 2  . ferrous sulfate 325 (65 FE) MG EC tablet Take 325 mg by mouth daily.  5  . GARLIC PO Take 1 tablet by mouth daily.    Marland Kitchen lisinopril (PRINIVIL,ZESTRIL) 10 MG tablet Take 10 mg by mouth daily.    . Multiple Vitamin (MULTIVITAMIN WITH MINERALS) TABS tablet Take 1 tablet by mouth daily.    . Omega-3 Fatty Acids (FISH OIL) 1000 MG CAPS Take 1,000 mg by mouth at bedtime.    Marland Kitchen Propylene Glycol 0.6 % SOLN Place 1 drop into both eyes daily.    . vitamin C (ASCORBIC ACID) 500 MG tablet Take 500 mg by mouth daily.    . Vitamin D, Ergocalciferol, (DRISDOL) 50000 UNITS CAPS capsule Take 50,000 Units by  mouth every 7 (seven) days.      Review of Systems: A full ROS was attempted today and was able to be performed.  Systems assessed include - Constitutional, Eyes, HENT, Respiratory, Cardiovascular, Gastrointestinal, Genitourinary, Integument/breast, Hematologic/lymphatic, Musculoskeletal, Neurological, Behavioral/Psych, Endocrine, Allergic/Immunologic - with pertinent responses as per HPI.  Physical Examination: Pulse Rate:  [id number] (01/28 1715) Resp:  [8-25] 25 (01/28 1715) BP: (115-194)/(50-120) 141/70 (01/28 1715) SpO2:  [98 %-100 %] 99 % (01/28 1715) Weight:  [94.8 kg] 94.8 kg (01/28 1600)  General - well nourished, well developed, in no apparent distress.    Ophthalmologic -  fundi not visualized due to noncooperation.    Cardiovascular - regular rhythm and rate  Neuro - awake alert, severe dysarthria, able to name 2/3 and repeat in a very dysarthric voice. Difficulty with orientation questions due to dysarthria. No gaze preference, PERRL, EOMI, visual field full. Right mild facial droop. Tongue midline. Right UE mild drift. LLE 5/5. BLEs 4/5. Sensation symmetrical. FTN intact bilaterally. Gait not tested.  NIH Stroke Scale  Level Of Consciousness 0=Alert; keenly responsive 1=Arouse to minor stimulation 2=Requires repeated stimulation to arouse or movements to pain 3=postures or unresponsive 0  LOC Questions to Month and Age 104=Answers both questions correctly 1=Answers one question correctly or dysarthria/intubated/trauma/language barrier 2=Answers neither question correctly or aphasia 1  LOC Commands      -Open/Close eyes     -Open/close grip     -Pantomime commands if communication barrier 0=Performs both tasks correctly 1=Performs one task correctly 2=Performs neighter task correctly 0  Best Gaze     -Only assess horizontal gaze 0=Normal 1=Partial gaze palsy 2=Forced deviation, or total gaze paresis 0  Visual 0=No visual loss 1=Partial hemianopia 2=Complete hemianopia 3=Bilateral hemianopia (blind including cortical blindness) 0  Facial Palsy     -Use grimace if obtunded 0=Normal symmetrical movement 1=Minor paralysis (asymmetry) 2=Partial paralysis (lower face) 3=Complete paralysis (upper and lower face) 1  Motor  0=No drift for 10/5 seconds 1=Drift, but does not hit bed 2=Some antigravity effort, hits  bed 3=No effort against gravity, limb falls 4=No movement 0=Amputation/joint fusion Right Arm 1     Leg 0    Left Arm 0     Leg 0  Limb Ataxia     - FNT/HTS 0=Absent or does not understand or paralyzed or amputation/joint fusion 1=Present in one limb 2=Present in two limbs 0  Sensory 0=Normal 1=Mild to moderate sensory loss 2=Severe to  total sensory loss or coma/unresponsive 0  Best Language 0=No aphasia, normal 1=Mild to moderate aphasia 2=Severe aphasia 3=Mute, global aphasia, or coma/unresponsive 0  Dysarthria 0=Normal 1=Mild to moderate 2=Severe, unintelligible or mute/anarthric 0=intubated/unable to test 2  Extinction/Neglect 0=No abnormality 1=visual/tactile/auditory/spatia/personal inattention/Extinction to bilateral simultaneous stimulation 2=Profound neglect/extinction more than 1 modality  0  Total   5     Data Reviewed: CT ANGIO HEAD W OR WO CONTRAST  Result Date: 09/20/2019 CLINICAL DATA:  74 year old female code stroke presentation with aphasia. EXAM: CT ANGIOGRAPHY HEAD AND NECK CT PERFUSION BRAIN TECHNIQUE: Multidetector CT imaging of the head and neck was performed using the standard protocol during bolus administration of intravenous contrast. Multiplanar CT image reconstructions and MIPs were obtained to evaluate the vascular anatomy. Carotid stenosis measurements (when applicable) are obtained utilizing NASCET criteria, using the distal internal carotid diameter as the denominator. Multiphase CT imaging of the brain was performed following IV bolus contrast injection. Subsequent parametric perfusion maps were calculated using RAPID software. CONTRAST:  173mL OMNIPAQUE IOHEXOL  350 MG/ML SOLN COMPARISON:  Plain head CT 1619 hours today. Brain MRI and intracranial MRA 12/23/2016 FINDINGS: CT Brain Perfusion Findings: ASPECTS: 10 CBF (<30%) Volume: None Perfusion (Tmax>6.0s) volume: 3 mL Mismatch Volume: 3 mL Infarction Location:No core infarct detected, but abnormal perfusion is in the left frontal lobe deep white matter, also left frontal operculum (T-max >4s). CTA NECK Skeleton: Absent dentition. Widespread cervical spine disc and endplate degeneration. No acute osseous abnormality identified. Upper chest: Negative. Other neck: Negative. Aortic arch: 3 vessel arch configuration with mild arch atherosclerosis.  Right carotid system: Mild brachiocephalic artery plaque without stenosis. Tortuous brachiocephalic artery and proximal right CCA, which has a kinked appearance at the thoracic inlet. No proximal right CCA plaque. Partially retropharyngeal course of the right carotid and the right bifurcation. No cervical right ICA plaque or stenosis. Tortuosity at C1. Left carotid system: Minimal plaque at the left CCA origin. Tortuous proximal left CCA with a partially retropharyngeal course. Negative left carotid bifurcation. Tortuous cervical left ICA without stenosis. Vertebral arteries: Mildly tortuous proximal right subclavian artery with minimal plaque and no stenosis. Mild calcified plaque at the right vertebral artery origin without significant stenosis. The right vertebral is non dominant and somewhat diminutive but patent to the skull base without stenosis. Mostly soft plaque in the proximal left subclavian artery without significant stenosis. Normal left vertebral artery origin. Tortuous V1 segment with a kinked appearance. Dominant left vertebral artery is patent to the skull base without stenosis. CTA HEAD Posterior circulation: No vertebral artery V4 plaque or stenosis. The left vertebral is dominant. Patent PICA origins and vertebrobasilar junction. Mildly tortuous basilar artery without stenosis. Tortuous basilar tip and fetal left PCA origin. Normal right PCA and SCA origins. Diminutive or absent right posterior communicating artery. The left PCA branches are within normal limits. The right PCA branches appear mildly irregular but stable since 2018. Anterior circulation: Both ICA siphons are patent. On the left there is no plaque or stenosis. Tortuous left posterior communicating artery. On the right there is minor calcified plaque without stenosis. Patent carotid termini. Normal MCA and ACA origins. Diminutive or absent anterior communicating artery. Bilateral ACA branches are within normal limits. Right MCA M1  segment and bifurcation are patent without stenosis. Right MCA branches are stable and within normal limits. Left MCA M1 segment and bifurcation are patent without stenosis. There is a left MCA distal M2/M3 occlusion of a middle branch on series 5, images 78 and 79 and series 10, image 270. This was a patent trifurcation on the 2018 MRA. The occlusion is located 2 centimeters or slightly more from the left MCA bifurcation. The remaining left MCA branches appear stable. Venous sinuses: Early contrast timing, grossly patent. Anatomic variants: Dominant left vertebral artery. Fetal type left PCA origin. Review of the MIP images confirms the above findings IMPRESSION: 1. Positive for a Left MCA distal M2/M3 branch occlusion in the middle division. See series 10, image 27. 2. No core infarct detected by CTP. A small volume of middle Left MCA division ischemic penumbra is detected (up to 35 milliliters). 3. Otherwise generalized arterial tortuosity in the head and neck with mild for age atherosclerosis. #1 was communicated to Dr. Erlinda Hong at 1658 hours by text page via the [facility] messaging system. Electronically Signed   By: Genevie Ann M.D.   On: 09/20/2019 17:02   CT ANGIO NECK W OR WO CONTRAST  Result Date: 09/20/2019 CLINICAL DATA:  74 year old female code stroke presentation with aphasia. EXAM: CT ANGIOGRAPHY HEAD  AND NECK CT PERFUSION BRAIN TECHNIQUE: Multidetector CT imaging of the head and neck was performed using the standard protocol during bolus administration of intravenous contrast. Multiplanar CT image reconstructions and MIPs were obtained to evaluate the vascular anatomy. Carotid stenosis measurements (when applicable) are obtained utilizing NASCET criteria, using the distal internal carotid diameter as the denominator. Multiphase CT imaging of the brain was performed following IV bolus contrast injection. Subsequent parametric perfusion maps were calculated using RAPID software. CONTRAST:  163mL OMNIPAQUE  IOHEXOL 350 MG/ML SOLN COMPARISON:  Plain head CT 1619 hours today. Brain MRI and intracranial MRA 12/23/2016 FINDINGS: CT Brain Perfusion Findings: ASPECTS: 10 CBF (<30%) Volume: None Perfusion (Tmax>6.0s) volume: 3 mL Mismatch Volume: 3 mL Infarction Location:No core infarct detected, but abnormal perfusion is in the left frontal lobe deep white matter, also left frontal operculum (T-max >4s). CTA NECK Skeleton: Absent dentition. Widespread cervical spine disc and endplate degeneration. No acute osseous abnormality identified. Upper chest: Negative. Other neck: Negative. Aortic arch: 3 vessel arch configuration with mild arch atherosclerosis. Right carotid system: Mild brachiocephalic artery plaque without stenosis. Tortuous brachiocephalic artery and proximal right CCA, which has a kinked appearance at the thoracic inlet. No proximal right CCA plaque. Partially retropharyngeal course of the right carotid and the right bifurcation. No cervical right ICA plaque or stenosis. Tortuosity at C1. Left carotid system: Minimal plaque at the left CCA origin. Tortuous proximal left CCA with a partially retropharyngeal course. Negative left carotid bifurcation. Tortuous cervical left ICA without stenosis. Vertebral arteries: Mildly tortuous proximal right subclavian artery with minimal plaque and no stenosis. Mild calcified plaque at the right vertebral artery origin without significant stenosis. The right vertebral is non dominant and somewhat diminutive but patent to the skull base without stenosis. Mostly soft plaque in the proximal left subclavian artery without significant stenosis. Normal left vertebral artery origin. Tortuous V1 segment with a kinked appearance. Dominant left vertebral artery is patent to the skull base without stenosis. CTA HEAD Posterior circulation: No vertebral artery V4 plaque or stenosis. The left vertebral is dominant. Patent PICA origins and vertebrobasilar junction. Mildly tortuous basilar  artery without stenosis. Tortuous basilar tip and fetal left PCA origin. Normal right PCA and SCA origins. Diminutive or absent right posterior communicating artery. The left PCA branches are within normal limits. The right PCA branches appear mildly irregular but stable since 2018. Anterior circulation: Both ICA siphons are patent. On the left there is no plaque or stenosis. Tortuous left posterior communicating artery. On the right there is minor calcified plaque without stenosis. Patent carotid termini. Normal MCA and ACA origins. Diminutive or absent anterior communicating artery. Bilateral ACA branches are within normal limits. Right MCA M1 segment and bifurcation are patent without stenosis. Right MCA branches are stable and within normal limits. Left MCA M1 segment and bifurcation are patent without stenosis. There is a left MCA distal M2/M3 occlusion of a middle branch on series 5, images 78 and 79 and series 10, image 270. This was a patent trifurcation on the 2018 MRA. The occlusion is located 2 centimeters or slightly more from the left MCA bifurcation. The remaining left MCA branches appear stable. Venous sinuses: Early contrast timing, grossly patent. Anatomic variants: Dominant left vertebral artery. Fetal type left PCA origin. Review of the MIP images confirms the above findings IMPRESSION: 1. Positive for a Left MCA distal M2/M3 branch occlusion in the middle division. See series 10, image 27. 2. No core infarct detected by CTP. A small volume of  middle Left MCA division ischemic penumbra is detected (up to 35 milliliters). 3. Otherwise generalized arterial tortuosity in the head and neck with mild for age atherosclerosis. #1 was communicated to Dr. Erlinda Hong at 1658 hours by text page via the [facility] messaging system. Electronically Signed   By: Genevie Ann M.D.   On: 09/20/2019 17:02   CT CEREBRAL PERFUSION W CONTRAST  Result Date: 09/20/2019 CLINICAL DATA:  74 year old female code stroke presentation with  aphasia. EXAM: CT ANGIOGRAPHY HEAD AND NECK CT PERFUSION BRAIN TECHNIQUE: Multidetector CT imaging of the head and neck was performed using the standard protocol during bolus administration of intravenous contrast. Multiplanar CT image reconstructions and MIPs were obtained to evaluate the vascular anatomy. Carotid stenosis measurements (when applicable) are obtained utilizing NASCET criteria, using the distal internal carotid diameter as the denominator. Multiphase CT imaging of the brain was performed following IV bolus contrast injection. Subsequent parametric perfusion maps were calculated using RAPID software. CONTRAST:  180mL OMNIPAQUE IOHEXOL 350 MG/ML SOLN COMPARISON:  Plain head CT 1619 hours today. Brain MRI and intracranial MRA 12/23/2016 FINDINGS: CT Brain Perfusion Findings: ASPECTS: 10 CBF (<30%) Volume: None Perfusion (Tmax>6.0s) volume: 3 mL Mismatch Volume: 3 mL Infarction Location:No core infarct detected, but abnormal perfusion is in the left frontal lobe deep white matter, also left frontal operculum (T-max >4s). CTA NECK Skeleton: Absent dentition. Widespread cervical spine disc and endplate degeneration. No acute osseous abnormality identified. Upper chest: Negative. Other neck: Negative. Aortic arch: 3 vessel arch configuration with mild arch atherosclerosis. Right carotid system: Mild brachiocephalic artery plaque without stenosis. Tortuous brachiocephalic artery and proximal right CCA, which has a kinked appearance at the thoracic inlet. No proximal right CCA plaque. Partially retropharyngeal course of the right carotid and the right bifurcation. No cervical right ICA plaque or stenosis. Tortuosity at C1. Left carotid system: Minimal plaque at the left CCA origin. Tortuous proximal left CCA with a partially retropharyngeal course. Negative left carotid bifurcation. Tortuous cervical left ICA without stenosis. Vertebral arteries: Mildly tortuous proximal right subclavian artery with minimal  plaque and no stenosis. Mild calcified plaque at the right vertebral artery origin without significant stenosis. The right vertebral is non dominant and somewhat diminutive but patent to the skull base without stenosis. Mostly soft plaque in the proximal left subclavian artery without significant stenosis. Normal left vertebral artery origin. Tortuous V1 segment with a kinked appearance. Dominant left vertebral artery is patent to the skull base without stenosis. CTA HEAD Posterior circulation: No vertebral artery V4 plaque or stenosis. The left vertebral is dominant. Patent PICA origins and vertebrobasilar junction. Mildly tortuous basilar artery without stenosis. Tortuous basilar tip and fetal left PCA origin. Normal right PCA and SCA origins. Diminutive or absent right posterior communicating artery. The left PCA branches are within normal limits. The right PCA branches appear mildly irregular but stable since 2018. Anterior circulation: Both ICA siphons are patent. On the left there is no plaque or stenosis. Tortuous left posterior communicating artery. On the right there is minor calcified plaque without stenosis. Patent carotid termini. Normal MCA and ACA origins. Diminutive or absent anterior communicating artery. Bilateral ACA branches are within normal limits. Right MCA M1 segment and bifurcation are patent without stenosis. Right MCA branches are stable and within normal limits. Left MCA M1 segment and bifurcation are patent without stenosis. There is a left MCA distal M2/M3 occlusion of a middle branch on series 5, images 78 and 79 and series 10, image 270. This was a patent trifurcation  on the 2018 MRA. The occlusion is located 2 centimeters or slightly more from the left MCA bifurcation. The remaining left MCA branches appear stable. Venous sinuses: Early contrast timing, grossly patent. Anatomic variants: Dominant left vertebral artery. Fetal type left PCA origin. Review of the MIP images confirms the  above findings IMPRESSION: 1. Positive for a Left MCA distal M2/M3 branch occlusion in the middle division. See series 10, image 27. 2. No core infarct detected by CTP. A small volume of middle Left MCA division ischemic penumbra is detected (up to 35 milliliters). 3. Otherwise generalized arterial tortuosity in the head and neck with mild for age atherosclerosis. #1 was communicated to Dr. Erlinda Hong at 1658 hours by text page via the [facility] messaging system. Electronically Signed   By: Genevie Ann M.D.   On: 09/20/2019 17:02   CT HEAD CODE STROKE WO CONTRAST  Result Date: 09/20/2019 CLINICAL DATA:  Code stroke.  Aphasia EXAM: CT HEAD WITHOUT CONTRAST TECHNIQUE: Contiguous axial images were obtained from the base of the skull through the vertex without intravenous contrast. COMPARISON:  2019 FINDINGS: Brain: There is no acute intracranial hemorrhage, mass effect, or edema. There is no new loss of gray differentiation. Multiple chronic infarcts are again identified including within the right parietal lobe and bilateral cerebellar hemispheres. There is a new age-indeterminate right thalamic infarct. Ventricles and sulci are stable in size with parietal dominant atrophy. Patchy hypoattenuation in the supratentorial white matter is nonspecific but may reflect mild chronic microvascular ischemic changes. There is no extra-axial fluid collection. Vascular: No hyperdense vessel. Skull: Calvarial hyperostosis.  Otherwise unremarkable. Sinuses/Orbits: Trace mucosal thickening. No acute orbital abnormality. Other: Partially empty sella. ASPECTS ([place] Stroke Program Early CT Score) - Ganglionic level infarction (caudate, lentiform nuclei, internal capsule, insula, M1-M3 cortex): 7 - Supraganglionic infarction (M4-M6 cortex): 3 Total score (0-10 with 10 being normal): 10 IMPRESSION: No acute intracranial hemorrhage or evidence of acute infarction. ASPECT score is 10. Age-indeterminate small right thalamic infarct. Multiple small  chronic infarcts. These results were communicated to Dr. Erlinda Hong at [place] 1/28/2021by text page via the [facility] messaging system. Electronically Signed   By: Macy Mis M.D.   On: 09/20/2019 16:32    Assessment: 74 y.o. female with PMH of stroke, TIA, CAD, HTN, PVD, HLD presented to ER for speech difficulty. Also found to have mild right facial droop and right arm drift. NIHSS =5. CT no acute finding. tPA offered and pt consented herself. tPA given 4:30pm. CTA head and neck concerning for left M2/M3 occlusion. CTP small perfusion deficit left parietal. Will admitted to 3W for close post tPA care  Stroke Risk Factors - hyperlipidemia, hypertension and PVD  Plan: - Admit to 3W for routine post IV tPA care  - Check blood pressure and NIHSS every 15 min for 2 h, then every 30 min for 6 h, and finally every hour for 16 h - BP goal < 180/105 - CT or MRI brain 24 hours post tPA - Stat CT head without contrast if acute neuro changes - NPO until swallowing screen performed and passed - No antiplatelet agents or anticoagulants (including heparin for DVT prophylaxis) in first 24 hours - No Foley catheter, nasogastric tube, arterial catheter or central venous catheter for 24 hours, unless absolutely necessary - Telemetry - Euglycemia  - Avoid hyperthermia, PRN acetaminophen - DVT prophylaxis with SCDs  Rosalin Hawking, MD PhD Stroke Neurology 09/20/2019 5:47 PM

## 2019-09-20 NOTE — ED Notes (Signed)
Pt to CT w/ this RN, RRT RN and primary RN. MD Rory Percy and MD Erlinda Hong met pt in CT for eval. While placing pt on CT table, pt becoming slightly more arousable and responsive. Plan to repeat CT head to r/o hemorrhagic conversion. Suction available, pt on [place] 4LPM. MD Little remains in CT w/ pt.

## 2019-09-20 NOTE — Progress Notes (Signed)
PHARMACIST CODE STROKE RESPONSE  Notified to mix tPA at 16:25 by Dr. Erlinda Hong Delivered tPA to RN at 16:29  tPA dose = 8.5 mg bolus over 1 minute followed by 76.8 mg for a total dose of 85.3 mg over 1 hour  Issues/delays encountered (if applicable): Blood pressure elevated, treated with labetalol   Acey Lav, PharmD  PGY1 [place] Resident 09/20/19 4:38 PM

## 2019-09-20 NOTE — ED Triage Notes (Signed)
Pt arrives code stroke via [facility] from home.

## 2019-09-21 ENCOUNTER — Inpatient Hospital Stay (HOSPITAL_COMMUNITY): Payer: Medicare Other

## 2019-09-21 DIAGNOSIS — J0100 Acute maxillary sinusitis, unspecified: Secondary | ICD-10-CM

## 2019-09-21 DIAGNOSIS — Z8673 Personal history of transient ischemic attack (TIA), and cerebral infarction without residual deficits: Secondary | ICD-10-CM

## 2019-09-21 DIAGNOSIS — I10 Essential (primary) hypertension: Secondary | ICD-10-CM

## 2019-09-21 DIAGNOSIS — E7800 Pure hypercholesterolemia, unspecified: Secondary | ICD-10-CM

## 2019-09-21 DIAGNOSIS — I341 Nonrheumatic mitral (valve) prolapse: Secondary | ICD-10-CM

## 2019-09-21 DIAGNOSIS — I340 Nonrheumatic mitral (valve) insufficiency: Secondary | ICD-10-CM

## 2019-09-21 LAB — CBC
HCT: 34.2 % — ABNORMAL LOW (ref 36.0–46.0)
Hemoglobin: 10.9 g/dL — ABNORMAL LOW (ref 12.0–15.0)
MCH: 31.6 pg (ref 26.0–34.0)
MCHC: 31.9 g/dL (ref 30.0–36.0)
MCV: 99.1 fL (ref 80.0–100.0)
Platelets: 162 10*3/uL (ref 150–400)
RBC: 3.45 MIL/uL — ABNORMAL LOW (ref 3.87–5.11)
RDW: 12.8 % (ref 11.5–15.5)
WBC: 5.6 10*3/uL (ref 4.0–10.5)
nRBC: 0 % (ref 0.0–0.2)

## 2019-09-21 LAB — BASIC METABOLIC PANEL
Anion gap: 8 (ref 5–15)
BUN: 14 mg/dL (ref 8–23)
CO2: 23 mmol/L (ref 22–32)
Calcium: 8.3 mg/dL — ABNORMAL LOW (ref 8.9–10.3)
Chloride: 104 mmol/L (ref 98–111)
Creatinine, Ser: 0.97 mg/dL (ref 0.44–1.00)
GFR calc Af Amer: >60 mL/min (ref >60)
GFR calc non Af Amer: 58 mL/min — ABNORMAL LOW (ref >60)
Glucose, Bld: 97 mg/dL (ref 70–99)
Potassium: 4.7 mmol/L (ref 3.5–5.1)
Sodium: 135 mmol/L (ref 135–145)

## 2019-09-21 LAB — ECHOCARDIOGRAM COMPLETE: Weight: 3343.94 oz

## 2019-09-21 LAB — LIPID PANEL
Cholesterol: 211 mg/dL — ABNORMAL HIGH (ref 0–200)
HDL: 53 mg/dL (ref >40)
LDL Cholesterol: 147 mg/dL — ABNORMAL HIGH (ref 0–99)
Total CHOL/HDL Ratio: 4 RATIO
Triglycerides: 54 mg/dL (ref <150)
VLDL: 11 mg/dL (ref 0–40)

## 2019-09-21 LAB — HEMOGLOBIN A1C
Hgb A1c MFr Bld: 5.1 % (ref 4.8–5.6)
Mean Plasma Glucose: 99.67 mg/dL

## 2019-09-21 MED ORDER — FERROUS SULFATE 325 (65 FE) MG PO TABS
325.0000 mg | ORAL_TABLET | Freq: Every day | ORAL | Status: DC
  Administered 2019-09-22 – 2019-09-23 (×2): 325 mg via ORAL
  Filled 2019-09-21 (×2): qty 1

## 2019-09-21 MED ORDER — ADULT MULTIVITAMIN W/MINERALS CH
1.0000 | ORAL_TABLET | Freq: Every day | ORAL | Status: DC
  Administered 2019-09-22 – 2019-09-23 (×2): 1 via ORAL
  Filled 2019-09-21 (×2): qty 1

## 2019-09-21 MED ORDER — PANTOPRAZOLE SODIUM 40 MG PO TBEC
40.0000 mg | DELAYED_RELEASE_TABLET | Freq: Every day | ORAL | Status: DC
  Administered 2019-09-21 – 2019-09-23 (×3): 40 mg via ORAL
  Filled 2019-09-21 (×4): qty 1

## 2019-09-21 MED ORDER — LABETALOL HCL 5 MG/ML IV SOLN: 10.0000 mg | INTRAVENOUS | Status: DC | PRN

## 2019-09-21 MED ORDER — LISINOPRIL 10 MG PO TABS
10.0000 mg | ORAL_TABLET | Freq: Every day | ORAL | Status: DC
  Administered 2019-09-21 – 2019-09-23 (×3): 10 mg via ORAL
  Filled 2019-09-21 (×3): qty 1

## 2019-09-21 MED ORDER — CHLORHEXIDINE GLUCONATE CLOTH 2 % EX PADS
6.0000 | MEDICATED_PAD | Freq: Every day | CUTANEOUS | Status: DC
  Administered 2019-09-21: 6 via TOPICAL

## 2019-09-21 MED ORDER — CLOPIDOGREL BISULFATE 75 MG PO TABS
75.0000 mg | ORAL_TABLET | Freq: Every day | ORAL | Status: DC
  Administered 2019-09-21 – 2019-09-23 (×3): 75 mg via ORAL
  Filled 2019-09-21 (×3): qty 1

## 2019-09-21 MED ORDER — CARVEDILOL 12.5 MG PO TABS
12.5000 mg | ORAL_TABLET | Freq: Two times a day (BID) | ORAL | Status: DC
  Administered 2019-09-21 – 2019-09-23 (×4): 12.5 mg via ORAL
  Filled 2019-09-21 (×4): qty 1

## 2019-09-21 MED ORDER — ASPIRIN EC 81 MG PO TBEC
81.0000 mg | DELAYED_RELEASE_TABLET | Freq: Every day | ORAL | Status: DC
  Administered 2019-09-21 – 2019-09-23 (×3): 81 mg via ORAL
  Filled 2019-09-21 (×3): qty 1

## 2019-09-21 MED ORDER — AMOXICILLIN-POT CLAVULANATE 875-125 MG PO TABS
1.0000 | ORAL_TABLET | Freq: Two times a day (BID) | ORAL | Status: DC
  Administered 2019-09-21 (×2): 1 via ORAL
  Filled 2019-09-21 (×4): qty 1

## 2019-09-21 NOTE — Progress Notes (Signed)
STROKE TEAM PROGRESS NOTE   INTERVAL HISTORY Pt RN at bedside. Pt lying in bed, speech much improved, near baseline. Moving all extremities, slight drift on the right UE. She stated that her sinusitis acting up since yesterday, she can not remember what Abx she had used in the past. Discussed with pharmacy will give Augmentin for 5 days. MRI pending.    Vitals:   09/21/19 0730 09/21/19 0800 09/21/19 0830 09/21/19 0900  BP: 131/75 (!) 144/63 (!) 143/72 (!) 142/64  Pulse: 64 61 65 69  Resp: 20 (!) 22 (!) 22 (!) 23  Temp:  98 F (36.7 C)    TempSrc:  Oral    SpO2: 99% 98% 98% 99%  Weight:        CBC:  Recent Labs  Lab 09/20/19 1611 09/20/19 1611 09/20/19 1617 09/21/19 0107  WBC 5.0  --   --  5.6  NEUTROABS 3.5  --   --   --   HGB 11.8*   < > 12.2 10.9*  HCT 36.3   < > 36.0 34.2*  MCV 98.4  --   --  99.1  PLT 154  --   --  162   < > = values in this interval not displayed.    Basic Metabolic Panel:  Recent Labs  Lab 09/20/19 1611 09/20/19 1611 09/20/19 1617 09/21/19 0107  NA 132*   < > 134* 135  K 4.5   < > 4.5 4.7  CL 101   < > 102 104  CO2 23  --   --  23  GLUCOSE 118*   < > 114* 97  BUN 14   < > 18 14  CREATININE 1.22*   < > 1.10* 0.97  CALCIUM 9.1  --   --  8.3*   < > = values in this interval not displayed.   Lipid Panel:     Component Value Date/Time   CHOL 211 (H) 09/21/2019 0107   TRIG 54 09/21/2019 0107   HDL 53 09/21/2019 0107   CHOLHDL 4.0 09/21/2019 0107   VLDL 11 09/21/2019 0107   LDLCALC 147 (H) 09/21/2019 0107   HgbA1c:  Lab Results  Component Value Date   HGBA1C 5.1 09/21/2019   Urine Drug Screen: No results found for: LABOPIA, COCAINSCRNUR, LABBENZ, AMPHETMU, THCU, LABBARB  Alcohol Level No results found for: ETH  IMAGING past 48 hours CT ANGIO HEAD W OR WO CONTRAST  Result Date: 09/20/2019 CLINICAL DATA:  74 year old female code stroke presentation with aphasia. EXAM: CT ANGIOGRAPHY HEAD AND NECK CT PERFUSION BRAIN TECHNIQUE:  Multidetector CT imaging of the head and neck was performed using the standard protocol during bolus administration of intravenous contrast. Multiplanar CT image reconstructions and MIPs were obtained to evaluate the vascular anatomy. Carotid stenosis measurements (when applicable) are obtained utilizing NASCET criteria, using the distal internal carotid diameter as the denominator. Multiphase CT imaging of the brain was performed following IV bolus contrast injection. Subsequent parametric perfusion maps were calculated using RAPID software. CONTRAST:  144mL OMNIPAQUE IOHEXOL 350 MG/ML SOLN COMPARISON:  Plain head CT 1619 hours today. Brain MRI and intracranial MRA 12/23/2016 FINDINGS: CT Brain Perfusion Findings: ASPECTS: 10 CBF (<30%) Volume: None Perfusion (Tmax>6.0s) volume: 3 mL Mismatch Volume: 3 mL Infarction Location:No core infarct detected, but abnormal perfusion is in the left frontal lobe deep white matter, also left frontal operculum (T-max >4s). CTA NECK Skeleton: Absent dentition. Widespread cervical spine disc and endplate degeneration. No acute osseous abnormality identified. Upper  chest: Negative. Other neck: Negative. Aortic arch: 3 vessel arch configuration with mild arch atherosclerosis. Right carotid system: Mild brachiocephalic artery plaque without stenosis. Tortuous brachiocephalic artery and proximal right CCA, which has a kinked appearance at the thoracic inlet. No proximal right CCA plaque. Partially retropharyngeal course of the right carotid and the right bifurcation. No cervical right ICA plaque or stenosis. Tortuosity at C1. Left carotid system: Minimal plaque at the left CCA origin. Tortuous proximal left CCA with a partially retropharyngeal course. Negative left carotid bifurcation. Tortuous cervical left ICA without stenosis. Vertebral arteries: Mildly tortuous proximal right subclavian artery with minimal plaque and no stenosis. Mild calcified plaque at the right vertebral artery  origin without significant stenosis. The right vertebral is non dominant and somewhat diminutive but patent to the skull base without stenosis. Mostly soft plaque in the proximal left subclavian artery without significant stenosis. Normal left vertebral artery origin. Tortuous V1 segment with a kinked appearance. Dominant left vertebral artery is patent to the skull base without stenosis. CTA HEAD Posterior circulation: No vertebral artery V4 plaque or stenosis. The left vertebral is dominant. Patent PICA origins and vertebrobasilar junction. Mildly tortuous basilar artery without stenosis. Tortuous basilar tip and fetal left PCA origin. Normal right PCA and SCA origins. Diminutive or absent right posterior communicating artery. The left PCA branches are within normal limits. The right PCA branches appear mildly irregular but stable since 2018. Anterior circulation: Both ICA siphons are patent. On the left there is no plaque or stenosis. Tortuous left posterior communicating artery. On the right there is minor calcified plaque without stenosis. Patent carotid termini. Normal MCA and ACA origins. Diminutive or absent anterior communicating artery. Bilateral ACA branches are within normal limits. Right MCA M1 segment and bifurcation are patent without stenosis. Right MCA branches are stable and within normal limits. Left MCA M1 segment and bifurcation are patent without stenosis. There is a left MCA distal M2/M3 occlusion of a middle branch on series 5, images 78 and 79 and series 10, image 270. This was a patent trifurcation on the 2018 MRA. The occlusion is located 2 centimeters or slightly more from the left MCA bifurcation. The remaining left MCA branches appear stable. Venous sinuses: Early contrast timing, grossly patent. Anatomic variants: Dominant left vertebral artery. Fetal type left PCA origin. Review of the MIP images confirms the above findings IMPRESSION: 1. Positive for a Left MCA distal M2/M3 branch  occlusion in the middle division. See series 10, image 27. 2. No core infarct detected by CTP. A small volume of middle Left MCA division ischemic penumbra is detected (up to 35 milliliters). 3. Otherwise generalized arterial tortuosity in the head and neck with mild for age atherosclerosis. #1 was communicated to Dr. Erlinda Hong at 1658 hours by text page via the [facility] messaging system. Electronically Signed   By: Genevie Ann M.D.   On: 09/20/2019 17:02   CT HEAD WO CONTRAST  Result Date: 09/20/2019 CLINICAL DATA:  74 year old female code stroke presentation with aphasia, left M2/M3 branch occlusion on CTA today. Status post IV tPA. EXAM: CT HEAD WITHOUT CONTRAST TECHNIQUE: Contiguous axial images were obtained from the base of the skull through the vertex without intravenous contrast. COMPARISON:  Earlier CTA and CT P. presentation head CT 1619 hours. FINDINGS: Brain: No acute intracranial hemorrhage identified. No midline shift, mass effect, or evidence of intracranial mass lesion. No involving left MCA territory infarct is evident. Stable gray-white matter differentiation throughout the brain. Heterogeneous hypodensity in the right thalamus.  Vascular: Calcified atherosclerosis at the skull base. There is residual intravascular contrast. Skull: Hyperostosis.  No acute osseous abnormality identified. Sinuses/Orbits: Visualized paranasal sinuses and mastoids are stable and well pneumatized. Other: No acute orbit or scalp soft tissue findings. IMPRESSION: Stable from 1619 hours today. No intracranial hemorrhage or evolving left MCA infarct by CT. Electronically Signed   By: Genevie Ann M.D.   On: 09/20/2019 18:14   CT ANGIO NECK W OR WO CONTRAST  Result Date: 09/20/2019 CLINICAL DATA:  74 year old female code stroke presentation with aphasia. EXAM: CT ANGIOGRAPHY HEAD AND NECK CT PERFUSION BRAIN TECHNIQUE: Multidetector CT imaging of the head and neck was performed using the standard protocol during bolus administration of  intravenous contrast. Multiplanar CT image reconstructions and MIPs were obtained to evaluate the vascular anatomy. Carotid stenosis measurements (when applicable) are obtained utilizing NASCET criteria, using the distal internal carotid diameter as the denominator. Multiphase CT imaging of the brain was performed following IV bolus contrast injection. Subsequent parametric perfusion maps were calculated using RAPID software. CONTRAST:  128mL OMNIPAQUE IOHEXOL 350 MG/ML SOLN COMPARISON:  Plain head CT 1619 hours today. Brain MRI and intracranial MRA 12/23/2016 FINDINGS: CT Brain Perfusion Findings: ASPECTS: 10 CBF (<30%) Volume: None Perfusion (Tmax>6.0s) volume: 3 mL Mismatch Volume: 3 mL Infarction Location:No core infarct detected, but abnormal perfusion is in the left frontal lobe deep white matter, also left frontal operculum (T-max >4s). CTA NECK Skeleton: Absent dentition. Widespread cervical spine disc and endplate degeneration. No acute osseous abnormality identified. Upper chest: Negative. Other neck: Negative. Aortic arch: 3 vessel arch configuration with mild arch atherosclerosis. Right carotid system: Mild brachiocephalic artery plaque without stenosis. Tortuous brachiocephalic artery and proximal right CCA, which has a kinked appearance at the thoracic inlet. No proximal right CCA plaque. Partially retropharyngeal course of the right carotid and the right bifurcation. No cervical right ICA plaque or stenosis. Tortuosity at C1. Left carotid system: Minimal plaque at the left CCA origin. Tortuous proximal left CCA with a partially retropharyngeal course. Negative left carotid bifurcation. Tortuous cervical left ICA without stenosis. Vertebral arteries: Mildly tortuous proximal right subclavian artery with minimal plaque and no stenosis. Mild calcified plaque at the right vertebral artery origin without significant stenosis. The right vertebral is non dominant and somewhat diminutive but patent to the  skull base without stenosis. Mostly soft plaque in the proximal left subclavian artery without significant stenosis. Normal left vertebral artery origin. Tortuous V1 segment with a kinked appearance. Dominant left vertebral artery is patent to the skull base without stenosis. CTA HEAD Posterior circulation: No vertebral artery V4 plaque or stenosis. The left vertebral is dominant. Patent PICA origins and vertebrobasilar junction. Mildly tortuous basilar artery without stenosis. Tortuous basilar tip and fetal left PCA origin. Normal right PCA and SCA origins. Diminutive or absent right posterior communicating artery. The left PCA branches are within normal limits. The right PCA branches appear mildly irregular but stable since 2018. Anterior circulation: Both ICA siphons are patent. On the left there is no plaque or stenosis. Tortuous left posterior communicating artery. On the right there is minor calcified plaque without stenosis. Patent carotid termini. Normal MCA and ACA origins. Diminutive or absent anterior communicating artery. Bilateral ACA branches are within normal limits. Right MCA M1 segment and bifurcation are patent without stenosis. Right MCA branches are stable and within normal limits. Left MCA M1 segment and bifurcation are patent without stenosis. There is a left MCA distal M2/M3 occlusion of a middle branch on series 5,  images 78 and 79 and series 10, image 270. This was a patent trifurcation on the 2018 MRA. The occlusion is located 2 centimeters or slightly more from the left MCA bifurcation. The remaining left MCA branches appear stable. Venous sinuses: Early contrast timing, grossly patent. Anatomic variants: Dominant left vertebral artery. Fetal type left PCA origin. Review of the MIP images confirms the above findings IMPRESSION: 1. Positive for a Left MCA distal M2/M3 branch occlusion in the middle division. See series 10, image 27. 2. No core infarct detected by CTP. A small volume of  middle Left MCA division ischemic penumbra is detected (up to 35 milliliters). 3. Otherwise generalized arterial tortuosity in the head and neck with mild for age atherosclerosis. #1 was communicated to Dr. Erlinda Hong at 1658 hours by text page via the [facility] messaging system. Electronically Signed   By: Genevie Ann M.D.   On: 09/20/2019 17:02   CT CEREBRAL PERFUSION W CONTRAST  Result Date: 09/20/2019 CLINICAL DATA:  74 year old female code stroke presentation with aphasia. EXAM: CT ANGIOGRAPHY HEAD AND NECK CT PERFUSION BRAIN TECHNIQUE: Multidetector CT imaging of the head and neck was performed using the standard protocol during bolus administration of intravenous contrast. Multiplanar CT image reconstructions and MIPs were obtained to evaluate the vascular anatomy. Carotid stenosis measurements (when applicable) are obtained utilizing NASCET criteria, using the distal internal carotid diameter as the denominator. Multiphase CT imaging of the brain was performed following IV bolus contrast injection. Subsequent parametric perfusion maps were calculated using RAPID software. CONTRAST:  173mL OMNIPAQUE IOHEXOL 350 MG/ML SOLN COMPARISON:  Plain head CT 1619 hours today. Brain MRI and intracranial MRA 12/23/2016 FINDINGS: CT Brain Perfusion Findings: ASPECTS: 10 CBF (<30%) Volume: None Perfusion (Tmax>6.0s) volume: 3 mL Mismatch Volume: 3 mL Infarction Location:No core infarct detected, but abnormal perfusion is in the left frontal lobe deep white matter, also left frontal operculum (T-max >4s). CTA NECK Skeleton: Absent dentition. Widespread cervical spine disc and endplate degeneration. No acute osseous abnormality identified. Upper chest: Negative. Other neck: Negative. Aortic arch: 3 vessel arch configuration with mild arch atherosclerosis. Right carotid system: Mild brachiocephalic artery plaque without stenosis. Tortuous brachiocephalic artery and proximal right CCA, which has a kinked appearance at the thoracic inlet.  No proximal right CCA plaque. Partially retropharyngeal course of the right carotid and the right bifurcation. No cervical right ICA plaque or stenosis. Tortuosity at C1. Left carotid system: Minimal plaque at the left CCA origin. Tortuous proximal left CCA with a partially retropharyngeal course. Negative left carotid bifurcation. Tortuous cervical left ICA without stenosis. Vertebral arteries: Mildly tortuous proximal right subclavian artery with minimal plaque and no stenosis. Mild calcified plaque at the right vertebral artery origin without significant stenosis. The right vertebral is non dominant and somewhat diminutive but patent to the skull base without stenosis. Mostly soft plaque in the proximal left subclavian artery without significant stenosis. Normal left vertebral artery origin. Tortuous V1 segment with a kinked appearance. Dominant left vertebral artery is patent to the skull base without stenosis. CTA HEAD Posterior circulation: No vertebral artery V4 plaque or stenosis. The left vertebral is dominant. Patent PICA origins and vertebrobasilar junction. Mildly tortuous basilar artery without stenosis. Tortuous basilar tip and fetal left PCA origin. Normal right PCA and SCA origins. Diminutive or absent right posterior communicating artery. The left PCA branches are within normal limits. The right PCA branches appear mildly irregular but stable since 2018. Anterior circulation: Both ICA siphons are patent. On the left there is no  plaque or stenosis. Tortuous left posterior communicating artery. On the right there is minor calcified plaque without stenosis. Patent carotid termini. Normal MCA and ACA origins. Diminutive or absent anterior communicating artery. Bilateral ACA branches are within normal limits. Right MCA M1 segment and bifurcation are patent without stenosis. Right MCA branches are stable and within normal limits. Left MCA M1 segment and bifurcation are patent without stenosis. There is a  left MCA distal M2/M3 occlusion of a middle branch on series 5, images 78 and 79 and series 10, image 270. This was a patent trifurcation on the 2018 MRA. The occlusion is located 2 centimeters or slightly more from the left MCA bifurcation. The remaining left MCA branches appear stable. Venous sinuses: Early contrast timing, grossly patent. Anatomic variants: Dominant left vertebral artery. Fetal type left PCA origin. Review of the MIP images confirms the above findings IMPRESSION: 1. Positive for a Left MCA distal M2/M3 branch occlusion in the middle division. See series 10, image 27. 2. No core infarct detected by CTP. A small volume of middle Left MCA division ischemic penumbra is detected (up to 35 milliliters). 3. Otherwise generalized arterial tortuosity in the head and neck with mild for age atherosclerosis. #1 was communicated to Dr. Erlinda Hong at 1658 hours by text page via the [facility] messaging system. Electronically Signed   By: Genevie Ann M.D.   On: 09/20/2019 17:02   CT HEAD CODE STROKE WO CONTRAST  Result Date: 09/20/2019 CLINICAL DATA:  Code stroke.  Aphasia EXAM: CT HEAD WITHOUT CONTRAST TECHNIQUE: Contiguous axial images were obtained from the base of the skull through the vertex without intravenous contrast. COMPARISON:  2019 FINDINGS: Brain: There is no acute intracranial hemorrhage, mass effect, or edema. There is no new loss of gray differentiation. Multiple chronic infarcts are again identified including within the right parietal lobe and bilateral cerebellar hemispheres. There is a new age-indeterminate right thalamic infarct. Ventricles and sulci are stable in size with parietal dominant atrophy. Patchy hypoattenuation in the supratentorial white matter is nonspecific but may reflect mild chronic microvascular ischemic changes. There is no extra-axial fluid collection. Vascular: No hyperdense vessel. Skull: Calvarial hyperostosis.  Otherwise unremarkable. Sinuses/Orbits: Trace mucosal thickening. No  acute orbital abnormality. Other: Partially empty sella. ASPECTS ([place] Stroke Program Early CT Score) - Ganglionic level infarction (caudate, lentiform nuclei, internal capsule, insula, M1-M3 cortex): 7 - Supraganglionic infarction (M4-M6 cortex): 3 Total score (0-10 with 10 being normal): 10 IMPRESSION: No acute intracranial hemorrhage or evidence of acute infarction. ASPECT score is 10. Age-indeterminate small right thalamic infarct. Multiple small chronic infarcts. These results were communicated to Dr. Erlinda Hong at [place] 1/28/2021by text page via the [facility] messaging system. Electronically Signed   By: Macy Mis M.D.   On: 09/20/2019 16:32    PHYSICAL EXAM  Temp:  [97.5 F (36.4 C)-98.9 F (37.2 C)] 98.4 F (36.9 C) (01/29 1100) Pulse Rate:  [61-87] 71 (01/29 1300) Resp:  [8-28] 18 (01/29 1300) BP: (75-194)/(44-120) 164/70 (01/29 1300) SpO2:  [97 %-100 %] 98 % (01/29 1300) FiO2 (%):  [21 %] 21 % (01/28 1724) Weight:  [94.8 kg] 94.8 kg (01/28 1600)  General - Well nourished, well developed, in no apparent distress.  Ophthalmologic - fundi not visualized due to noncooperation.  Cardiovascular - Regular rhythm and rate.  Mental Status -  Level of arousal and orientation to time, place, and person were intact. Language including expression, naming, repetition, comprehension was assessed and found intact. Mild dysarthria due to poor denture  Cranial Nerves II - XII - II - Visual field intact OU. III, IV, VI - Extraocular movements intact. V - Facial sensation intact bilaterally. VII - Facial movement intact bilaterally. VIII - Hearing & vestibular intact bilaterally. X - Palate elevates symmetrically. XI - Chin turning & shoulder shrug intact bilaterally. XII - Tongue protrusion intact.  Motor Strength - The patient's strength was symmetrical in all extremities except slight pronator drift was on the right.  Bulk was normal and fasciculations were absent.   Motor Tone - Muscle  tone was assessed at the neck and appendages and was normal.  Reflexes - The patient's reflexes were symmetrical in all extremities and she had no pathological reflexes.  Sensory - Light touch, temperature/pinprick were assessed and were symmetrical.    Coordination - The patient had normal movements in the hands with no ataxia or dysmetria.  Tremor was absent.  Gait and Station - deferred.   ASSESSMENT/PLAN Katherine Carrillo is a 74 y.o. female with history of stroke, TIA, CAD, HTN, PVD, HLD presenting with R facial droop, R arm drift and expressive aphaisa. Received tPA 09/20/2019 at 1630.  Stroke:  left MCA small infarct s/p tPA,  Embolic pattern, source unclear  Code Stroke CT head No acute abnormality. Small vessel disease. Age indeterminate small R thalamic infarct. ASPECTS 10.     CTA head & neck L MCA distal M2/M3 branch occlusion middle division. Atherosclerosis   CT perfusion no core infarct, small 3cc L MCA division penumbra  Repeat CT no ICH or evolving infarct   2D Echo Pending  LDL 147  HgbA1c 5.1  SCDs for VTE prophylaxis  clopidogrel 75 mg daily prior to admission, now on No antithrombotic as within 24h of tPA administration. Resume plavix and add ASA if 24h imaging neg for hemorrhage.   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 LDL 151. d/c on plavix and pitavastatin  Hypertension  Home meds:  Coreg 12.5 bid, lisinopirl 10  Stable on the high end BP goal < 180/105 x 24h following tPA administration Resume home BP meds . Long-term BP goal normotensive  Hyperlipidemia  Home meds:  Fish oil. On pitavastatin in the past  Intolerant to statins in the past. Recommend to evaluate for [facility]-9 as an OP  LDL 147, goal < 70  Continue fish oil at discharge and follow up with PCP or lipid clinic  Sinusitis   Left facial mild pain due to sinusitis  flare up as per pt  Will start augmentin for 5 days  Other Stroke Risk Factors  Advanced age  Morbid Obesity, Body mass index is 39.49 kg/m., recommend weight loss, diet and exercise as appropriate   PVD s/p fem pop  Other Active Problems  Hx cervical cancer  Hospital day # 1  This patient is critically ill due to stroke s/p tPA, HTN, HLD, history of stroke and at significant risk of neurological worsening, death form recurrent stroke, hemorrhagic conversion, heart failure. This patient's care requires constant monitoring of vital signs, hemodynamics, respiratory and cardiac monitoring, review of multiple databases, neurological assessment, discussion with family, other specialists and medical decision making of high complexity. I spent 35 minutes of neurocritical care time in the care of this patient.  Rosalin Hawking, MD PhD Stroke Neurology 09/21/2019 1:46 PM   To contact Stroke Continuity provider, please refer to [email or web address] After hours, contact  General Neurology

## 2019-09-21 NOTE — Progress Notes (Signed)
RN entered pt room and noticed pt was tearful. Pt states "I have anxiety and depression and things hit me all at once sometimes". RN provided emotional support to pt who expressed gratitude. RN will continue to  assist as needed.

## 2019-09-21 NOTE — Plan of Care (Signed)
Pt identifies appropriate support needs

## 2019-09-21 NOTE — Evaluation (Signed)
Occupational Therapy Evaluation Patient Details Name: Katherine Carrillo MRN: [id number] DOB: [birth year] Today's Date: 09/21/2019    History of Present Illness 74 yo admitted 1/28 with facial droop, slurred speech and right arm drift s/p tPA at 1430 with acute worsening at 1745 with improvement after CT (-), await MRI. PMhx: TIA, CAD, HTN, PVD, HLD   Clinical Impression   PTA pt living with dtr, independent in BADL and has assist for IADL. At time of eval, pt able to complete bed mobility at min A and transfers at min A- min guard with RW. She presents with cognitive deficits in problem solving, sequencing, and STM. Toilet transfer completed with min A. Pt needing sequencing and navigational cues throughout eval. She also needed cues to not leave RW behind and would frequently walk away from it. She is able to maintain static standing at the sink for bilateral UE grooming tasks without LOB. Given current status, recommend HHOT at d/c for continued progression of BADL in home environment. Will continue to follow per POC listed below.     Follow Up Recommendations  Home health OT;Supervision/Assistance - 24 hour    Equipment Recommendations  3 in 1 bedside commode    Recommendations for Other Services       Precautions / Restrictions Precautions Precautions: Fall Precaution Comments: incontinent Restrictions Weight Bearing Restrictions: No      Mobility Bed Mobility Overal bed mobility: Needs Assistance Bed Mobility: Supine to Sit     Supine to sit: Min assist     General bed mobility comments: min A to scoot to EOB and get feet on floor  Transfers Overall transfer level: Needs assistance Equipment used: Rolling walker (2 wheeled) Transfers: Sit to/from Stand Sit to Stand: Min assist;Min guard         General transfer comment: min guard to min A depending on height of surface for safety and steadying    Balance Overall balance assessment: Mild deficits observed, not  formally tested                                         ADL either performed or assessed with clinical judgement   ADL Overall ADL's : Needs assistance/impaired Eating/Feeding: Set up;Sitting   Grooming: Min guard;Standing;Wash/dry hands;Brushing hair   Upper Body Bathing: Set up;Sitting   Lower Body Bathing: Moderate assistance;Sit to/from stand;Sitting/lateral leans   Upper Body Dressing : Set up;Sitting   Lower Body Dressing: Moderate assistance;Sit to/from stand;Sitting/lateral leans   Toilet Transfer: Minimal assistance;RW;Cueing for safety;Cueing for sequencing;Ambulation Toilet Transfer Details (indicate cue type and reason): into bathroom in room, cues for navigation, safety, and sequencing Toileting- Clothing Manipulation and Hygiene: Minimal assistance;Sit to/from stand Toileting - Clothing Manipulation Details (indicate cue type and reason): for posterior peri care Tub/ Shower Transfer: Minimal assistance;3 in 1;Ambulation;Rolling walker   Functional mobility during ADLs: Minimal assistance;Cueing for safety;Cueing for sequencing;Rolling walker General ADL Comments: pt presents with higher level cognitive tasks, decreased functional mobility, and increased risk for falls     Vision Patient Visual Report: No change from baseline       Perception     Praxis      Pertinent Vitals/Pain Pain Assessment: Faces Faces Pain Scale: Hurts a little bit Pain Location: bil shoulders and knees Pain Descriptors / Indicators: Aching Pain Intervention(s): Limited activity within patient's tolerance;Monitored during session;Repositioned     Hand Dominance Right  Extremity/Trunk Assessment Upper Extremity Assessment Upper Extremity Assessment: Generalized weakness   Lower Extremity Assessment Lower Extremity Assessment: Defer to PT evaluation       Communication Communication Communication: No difficulties   Cognition Arousal/Alertness:  Awake/alert Behavior During Therapy: WFL for tasks assessed/performed Overall Cognitive Status: Impaired/Different from baseline Area of Impairment: Memory;Attention;Problem solving                   Current Attention Level: Selective Memory: Decreased short-term memory       Problem Solving: Difficulty sequencing General Comments: pt needig increased time to sequence multi step tasks, shows some deficits with STM, and presents with selective attention that impacts safety with functional mobility   General Comments       Exercises     Shoulder Instructions      Home Living Family/patient expects to be discharged to:: Private residence Living Arrangements: Children Available Help at Discharge: Family;Available PRN/intermittently Type of Home: House Home Access: Stairs to enter [employer] of Steps: 3 Entrance Stairs-Rails: Left Home Layout: Multi-level Alternate Level Stairs-Number of Steps: 4   Bathroom Shower/Tub: Teacher, early years/pre: Standard     Home Equipment: Cane - single point          Prior Functioning/Environment Level of Independence: Independent with assistive device(s)        Comments: uses cane for mobility and performs all ADLs, daughter assist with iADL        OT Problem List: Decreased strength;Decreased knowledge of use of DME or AE;Decreased knowledge of precautions;Decreased activity tolerance;Decreased cognition;Impaired balance (sitting and/or standing)      OT Treatment/Interventions: Self-care/ADL training;Therapeutic exercise;Patient/family education;Balance training;Energy conservation;Therapeutic activities;DME and/or AE instruction;Cognitive remediation/compensation    OT Goals(Current goals can be found in the care plan section) Acute Rehab OT Goals Patient Stated Goal: return home, walk a mile at the track (hasn't done this for a year), read OT Goal Formulation: With patient Time For Goal  Achievement: 10/05/19 Potential to Achieve Goals: Good  OT Frequency: Min 2X/week   Barriers to D/C:            Co-evaluation              AM-PAC OT "6 Clicks" Daily Activity     Outcome Measure Help from another person eating meals?: None Help from another person taking care of personal grooming?: None Help from another person toileting, which includes using toliet, bedpan, or urinal?: A Little Help from another person bathing (including washing, rinsing, drying)?: A Little Help from another person to put on and taking off regular upper body clothing?: None Help from another person to put on and taking off regular lower body clothing?: A Little 6 Click Score: 21   End of Session Equipment Utilized During Treatment: Gait belt;Rolling walker Nurse Communication: Mobility status  Activity Tolerance: Patient tolerated treatment well Patient left: in bed;with call bell/phone within reach;with bed alarm set  OT Visit Diagnosis: Unsteadiness on feet (R26.81);Other abnormalities of gait and mobility (R26.89);Muscle weakness (generalized) (M62.81);Other symptoms and signs involving the nervous system (R29.898)                Time: [id number] OT Time Calculation (min): 24 min Charges:  OT General Charges $OT Visit: 1 Visit OT Evaluation $OT Eval Moderate Complexity: 1 Mod OT Treatments $Self Care/Home Management : 8-22 mins  Zenovia Jarred, MSOT, OTR/L Acute Rehabilitation Services [facility] Office Number: [phone]  Zenovia Jarred 09/21/2019, 5:04 PM

## 2019-09-21 NOTE — Progress Notes (Signed)
SLP Cancellation Note  Patient Details Name: Katherine Carrillo MRN: [id number] DOB: Dec 02, 1945   Cancelled treatment:       Reason Eval/Treat Not Completed: Patient unavailable - currently working with PT per RN. Also note that pt has passed the [facility] swallow screen, therefore will defer swallow evaluation for now. Will f/u for speech/language evaluation as able.    Osie Bond., M.A. [place] Acute Rehabilitation Services Pager [phone] Office [phone]  09/21/2019, 12:28 PM

## 2019-09-21 NOTE — Progress Notes (Signed)
  Echocardiogram 2D Echocardiogram has been performed.  Katherine Carrillo 09/21/2019, 10:37 AM

## 2019-09-21 NOTE — Evaluation (Signed)
Physical Therapy Evaluation Patient Details Name: Katherine Carrillo MRN: [id number] DOB: Feb 14, 1946 Today's Date: 09/21/2019   History of Present Illness  74 yo admitted 1/28 with facial droop, slurred speech and right arm drift s/p tPA at 1430 with acute worsening at 1745 with improvement after CT (-), await MRI. PMhx: TIA, CAD, HTN, PVD, HLD  Clinical Impression  Pt very pleasant and very willing to mobilize. Pt reports slight speech deficits from baseline but after mobility reports return to baseline other than being "stiff". Pt uses cane at baseline but benefits from RW for gait currently. Pt with decreased transfers, gait and functional mobility who will benefit from acute therapy to maximize mobility, safety and independence. Pt and daughter educated for stroke signs/symptoms (BE FAST). Pt encouraged to mobilize with staff with RW.     Follow Up Recommendations Home health PT    Equipment Recommendations  Rolling walker with 5" wheels    Recommendations for Other Services OT consult     Precautions / Restrictions Precautions Precautions: Fall Precaution Comments: incontinent      Mobility  Bed Mobility Overal bed mobility: Needs Assistance Bed Mobility: Supine to Sit     Supine to sit: Min assist     General bed mobility comments: supervision for lines with increased time and struggle to scoot fully to EOB with assist  Transfers Overall transfer level: Needs assistance   Transfers: Sit to/from Stand Sit to Stand: Supervision         General transfer comment: pt able to stand from bed and toilet without assist, supervision for lines  Ambulation/Gait Ambulation/Gait assistance: Min guard Gait Distance (Feet): 80 Feet Assistive device: Rolling walker (2 wheeled) Gait Pattern/deviations: Step-through pattern;Decreased stride length;Trunk flexed   Gait velocity interpretation: 1.31 - 2.62 ft/sec, indicative of limited community ambulator General Gait Details: pt with  flexed trunk with cues for posture and proximity to Rw. Pt reported fatigue and "knee weakness" after 40' resulting in return to room. Pt without LOB with use or RW with cues x 1 to avoid obstacle  Stairs            Wheelchair Mobility    Modified Rankin (Stroke Patients Only) Modified Rankin (Stroke Patients Only) Pre-Morbid Rankin Score: Moderate disability Modified Rankin: Moderately severe disability     Balance Overall balance assessment: Mild deficits observed, not formally tested                                           Pertinent Vitals/Pain Pain Assessment: 0-10 Pain Score: 2  Pain Location: bil shoulders and knees Pain Descriptors / Indicators: Aching;Other (Comment)(stiff) Pain Intervention(s): Limited activity within patient's tolerance;Monitored during session;Repositioned    Home Living Family/patient expects to be discharged to:: Private residence Living Arrangements: Children Available Help at Discharge: Family;Available PRN/intermittently Type of Home: House Home Access: Stairs to enter Entrance Stairs-Rails: Left Entrance Stairs-Number of Steps: 3 Home Layout: Multi-level Home Equipment: Cane - single point      Prior Function Level of Independence: Independent with assistive device(s)         Comments: uses cane for mobility and performs all ADLs, daughter assist with iADL     Hand Dominance   Dominant Hand: Right    Extremity/Trunk Assessment   Upper Extremity Assessment Upper Extremity Assessment: Generalized weakness    Lower Extremity Assessment Lower Extremity Assessment: Overall WFL for tasks assessed(bil hip  flexion 5/5, knee flexion 4/5, knee ext 5/5)    Cervical / Trunk Assessment Cervical / Trunk Assessment: Kyphotic  Communication   Communication: No difficulties  Cognition Arousal/Alertness: Awake/alert Behavior During Therapy: WFL for tasks assessed/performed Overall Cognitive Status: Within  Functional Limits for tasks assessed                                        General Comments      Exercises     Assessment/Plan    PT Assessment Patient needs continued PT services  PT Problem List Decreased mobility;Decreased activity tolerance;Decreased balance;Decreased knowledge of use of DME       PT Treatment Interventions DME instruction;Gait training;Stair training;Functional mobility training;Therapeutic activities;Patient/family education;Neuromuscular re-education;Balance training;Therapeutic exercise    PT Goals (Current goals can be found in the Care Plan section)  Acute Rehab PT Goals Patient Stated Goal: return home, walk a mile at the track (hasn't done this for a year), read PT Goal Formulation: With patient Time For Goal Achievement: 09/28/19 Potential to Achieve Goals: Good    Frequency Min 4X/week   Barriers to discharge Decreased caregiver support daughter works but reports other family members can provide initial support at D/C    Co-evaluation               AM-PAC PT "6 Clicks" Mobility  Outcome Measure Help needed turning from your back to your side while in a flat bed without using bedrails?: A Little Help needed moving from lying on your back to sitting on the side of a flat bed without using bedrails?: A Little Help needed moving to and from a bed to a chair (including a wheelchair)?: A Little Help needed standing up from a chair using your arms (e.g., wheelchair or bedside chair)?: None Help needed to walk in hospital room?: A Little Help needed climbing 3-5 steps with a railing? : A Little 6 Click Score: 19    End of Session Equipment Utilized During Treatment: Gait belt Activity Tolerance: Patient tolerated treatment well Patient left: in chair;with call bell/phone within reach;with family/visitor present;with nursing/sitter in room Nurse Communication: Mobility status PT Visit Diagnosis: Other abnormalities of gait  and mobility (R26.89);Difficulty in walking, not elsewhere classified (R26.2)    Time: [id number] PT Time Calculation (min) (ACUTE ONLY): 35 min   Charges:   PT Evaluation $PT Eval Moderate Complexity: 1 Mod PT Treatments $Therapeutic Activity: 8-22 mins        Cathyann Kilfoyle P, PT Acute Rehabilitation Services Pager: [phone] Office: [phone]   Dejanay Wamboldt B Renise Gillies 09/21/2019, 1:31 PM

## 2019-09-21 NOTE — Progress Notes (Signed)
PT Cancellation Note  Patient Details Name: Shawon Latchford MRN: [id number] DOB: 1945/12/16   Cancelled Treatment:    Reason Eval/Treat Not Completed: Active bedrest order   Sandy Salaam Chiamaka Latka 09/21/2019, 8:06 AM  Bayard Males, PT Acute Rehabilitation Services Pager: [phone] Office: [phone]

## 2019-09-22 ENCOUNTER — Other Ambulatory Visit: Payer: Self-pay

## 2019-09-22 ENCOUNTER — Encounter (HOSPITAL_COMMUNITY): Payer: Self-pay | Admitting: Neurology

## 2019-09-22 DIAGNOSIS — J329 Chronic sinusitis, unspecified: Secondary | ICD-10-CM

## 2019-09-22 DIAGNOSIS — G459 Transient cerebral ischemic attack, unspecified: Secondary | ICD-10-CM

## 2019-09-22 DIAGNOSIS — E782 Mixed hyperlipidemia: Secondary | ICD-10-CM

## 2019-09-22 LAB — BASIC METABOLIC PANEL
Anion gap: 9 (ref 5–15)
BUN: 12 mg/dL (ref 8–23)
CO2: 23 mmol/L (ref 22–32)
Calcium: 8.6 mg/dL — ABNORMAL LOW (ref 8.9–10.3)
Chloride: 102 mmol/L (ref 98–111)
Creatinine, Ser: 1.1 mg/dL — ABNORMAL HIGH (ref 0.44–1.00)
GFR calc Af Amer: 57 mL/min — ABNORMAL LOW (ref >60)
GFR calc non Af Amer: 49 mL/min — ABNORMAL LOW (ref >60)
Glucose, Bld: 83 mg/dL (ref 70–99)
Potassium: 4.5 mmol/L (ref 3.5–5.1)
Sodium: 134 mmol/L — ABNORMAL LOW (ref 135–145)

## 2019-09-22 LAB — CBC
HCT: 33.3 % — ABNORMAL LOW (ref 36.0–46.0)
Hemoglobin: 10.7 g/dL — ABNORMAL LOW (ref 12.0–15.0)
MCH: 31.8 pg (ref 26.0–34.0)
MCHC: 32.1 g/dL (ref 30.0–36.0)
MCV: 99.1 fL (ref 80.0–100.0)
Platelets: 161 10*3/uL (ref 150–400)
RBC: 3.36 MIL/uL — ABNORMAL LOW (ref 3.87–5.11)
RDW: 12.6 % (ref 11.5–15.5)
WBC: 4.6 10*3/uL (ref 4.0–10.5)
nRBC: 0 % (ref 0.0–0.2)

## 2019-09-22 MED ORDER — FLUTICASONE PROPIONATE 50 MCG/ACT NA SUSP
1.0000 | Freq: Every day | NASAL | Status: DC
  Administered 2019-09-22 – 2019-09-23 (×2): 1 via NASAL
  Filled 2019-09-22: qty 16

## 2019-09-22 MED ORDER — DIPHENHYDRAMINE HCL 50 MG/ML IJ SOLN
25.0000 mg | Freq: Once | INTRAMUSCULAR | Status: AC
  Administered 2019-09-22: 25 mg via INTRAVENOUS
  Filled 2019-09-22: qty 1

## 2019-09-22 NOTE — Progress Notes (Signed)
STROKE TEAM PROGRESS NOTE   INTERVAL HISTORY Pt RN at bedside. Pt lying in bed, speech much improved, near baseline. Moving all extremities, no acute event overnight. MRI no stroke. She did not tolerate Abx yesterday and will discontinue, will order flonase nasal spray. PT OT recommend [facility] PT/OT.   Vitals:   09/22/19 0500 09/22/19 0554 09/22/19 0818 09/22/19 0906  BP: 135/66 136/80 (!) 143/66 117/67  Pulse: 65 66 81 75  Resp: 17 18 20    Temp:  98 F (36.7 C) 98.2 F (36.8 C)   TempSrc:  Oral Oral   SpO2: 98% 100% 100%   Weight:  92.7 kg    Height:  5\' 1"  (1.549 m)      CBC:  Recent Labs  Lab 09/20/19 1611 09/20/19 1617 09/21/19 0107 09/22/19 0246  WBC 5.0   < > 5.6 4.6  NEUTROABS 3.5  --   --   --   HGB 11.8*   < > 10.9* 10.7*  HCT 36.3   < > 34.2* 33.3*  MCV 98.4   < > 99.1 99.1  PLT 154   < > 162 161   < > = values in this interval not displayed.    Basic Metabolic Panel:  Recent Labs  Lab 09/21/19 0107 09/22/19 0246  NA 135 134*  K 4.7 4.5  CL 104 102  CO2 23 23  GLUCOSE 97 83  BUN 14 12  CREATININE 0.97 1.10*  CALCIUM 8.3* 8.6*   Lipid Panel:     Component Value Date/Time   CHOL 211 (H) 09/21/2019 0107   TRIG 54 09/21/2019 0107   HDL 53 09/21/2019 0107   CHOLHDL 4.0 09/21/2019 0107   VLDL 11 09/21/2019 0107   LDLCALC 147 (H) 09/21/2019 0107   HgbA1c:  Lab Results  Component Value Date   HGBA1C 5.1 09/21/2019   Urine Drug Screen: No results found for: LABOPIA, COCAINSCRNUR, LABBENZ, AMPHETMU, THCU, LABBARB  Alcohol Level No results found for: ETH  IMAGING   CT ANGIO HEAD W OR WO CONTRAST CT ANGIO NECK W OR WO CONTRAST  CT CEREBRAL PERFUSION W CONTRAST 09/20/2019 IMPRESSION:  1. Positive for a Left MCA distal M2/M3 branch occlusion in the middle division. See series 10, image 27.  2. No core infarct detected by CTP. A small volume of middle Left MCA division ischemic penumbra is detected (up to 35 milliliters).  3. Otherwise generalized  arterial tortuosity in the head and neck with mild for age atherosclerosis.  CT HEAD WO CONTRAST 09/20/2019 IMPRESSION:  Stable from 1619 hours today. No intracranial hemorrhage or evolving left MCA infarct by CT.   MR BRAIN WO CONTRAST 09/21/2019 IMPRESSION:  1. No acute intracranial abnormality.  2. Mild chronic small vessel ischemic disease with small chronic cerebral and cerebellar infarcts as above, mildly progressed from 2018.   CT HEAD CODE STROKE WO CONTRAST 09/20/2019 IMPRESSION:  No acute intracranial hemorrhage or evidence of acute infarction. ASPECT score is 10. Age-indeterminate small right thalamic infarct. Multiple small chronic infarcts.   ECHOCARDIOGRAM COMPLETE 09/21/2019 IMPRESSIONS   1. Left ventricular ejection fraction, by visual estimation, is 55 to 60%. The left ventricle has normal function. There is no left ventricular hypertrophy.   2. Elevated left atrial pressure.   3. Left ventricular diastolic parameters are consistent with Grade I diastolic dysfunction (impaired relaxation).   4. The left ventricle has no regional wall motion abnormalities.   5. Global right ventricle has normal systolic function.The right ventricular size is  normal. No increase in right ventricular wall thickness.   6. Left atrial size was normal.   7. Right atrial size was normal.   8. Mild mitral annular calcification.   9. Mild mitral valve prolapse.  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  11. The tricuspid valve is normal in structure. Tricuspid valve regurgitation is trivial.  12. Aortic valve regurgitation is mild to moderate.  13. The aortic valve is possibly bicuspid. Aortic valve regurgitation is mild to moderate. No evidence of aortic valve stenosis.  14. The pulmonic valve was not well visualized. Pulmonic valve regurgitation is not visualized.  15. Mildly elevated pulmonary artery systolic pressure.  16. The inferior vena cava is  normal in size with greater than 50% respiratory variability, suggesting right atrial pressure of 3 mmHg.    PHYSICAL EXAM  Temp:  [98 F (36.7 C)-99.1 F (37.3 C)] 98.2 F (36.8 C) (01/30 0818) Pulse Rate:  [65-84] 75 (01/30 0906) Resp:  [14-22] 20 (01/30 0818) BP: (82-180)/(48-82) 117/67 (01/30 0906) SpO2:  [97 %-100 %] 100 % (01/30 0818) Weight:  [92.7 kg] 92.7 kg (01/30 0554)  General - Well nourished, well developed, in no apparent distress.  Ophthalmologic - fundi not visualized due to noncooperation.  Cardiovascular - Regular rhythm and rate.  Mental Status -  Level of arousal and orientation to time, place, and person were intact. Language including expression, naming, repetition, comprehension was assessed and found intact. Mild dysarthria due to poor denture  Cranial Nerves II - XII - II - Visual field intact OU. III, IV, VI - Extraocular movements intact. V - Facial sensation intact bilaterally. VII - Facial movement intact bilaterally. VIII - Hearing & vestibular intact bilaterally. X - Palate elevates symmetrically. XI - Chin turning & shoulder shrug intact bilaterally. XII - Tongue protrusion intact.  Motor Strength - The patient's strength was symmetrical in all extremities. Bulk was normal and fasciculations were absent.   Motor Tone - Muscle tone was assessed at the neck and appendages and was normal.  Reflexes - The patient's reflexes were symmetrical in all extremities and she had no pathological reflexes.  Sensory - Light touch, temperature/pinprick were assessed and were symmetrical.    Coordination - The patient had normal movements in the hands with no ataxia or dysmetria.  Tremor was absent.  Gait and Station - deferred.   ASSESSMENT/PLAN [name] is a 74 y.o. female with history of stroke, TIA, CAD, HTN, PVD, HLD presenting with R facial droop, R arm drift and expressive aphaisa.  Received tPA 09/20/2019 at 1630.  TIA or aborted  stroke s/p tPA   Code Stroke CT head No acute abnormality. Small vessel disease. Age indeterminate small R thalamic infarct. ASPECTS 10.     CTA head & neck L MCA distal M2/M3 branch occlusion middle division. Atherosclerosis   CT perfusion no core infarct, small 3cc L MCA division penumbra  Repeat CT no ICH or evolving infarct   MRI no acute infarct  2D Echo - EF 55 to 60%. No cardiac source of emboli identified.   LDL 147  HgbA1c 5.1  SCDs for VTE prophylaxis  clopidogrel 75 mg daily prior to admission, now on plavix 75 and ASA 81 DAPT for 3 weeks and then plavix alone.   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 LDL  151. d/c on plavix and pitavastatin  Hypertension  Home meds:  Coreg 12.5 bid, lisinopirl 10  Stable on the high end BP goal < 180/105 x 24h following tPA administration Resume home BP meds . Long-term BP goal normotensive  Hyperlipidemia  Home meds:  Fish oil. On pitavastatin in the past  Intolerant to statins in the past. Recommend to evaluate for [facility] as an OP  LDL 147, goal < 70  Continue fish oil at discharge and follow up with PCP or lipid clinic  Sinusitis   Left facial mild pain due to sinusitis flare up as per pt  Not tolerating augmentin -> d/c  Put on Flonase nasal spray  Other Stroke Risk Factors  Advanced age  Morbid Obesity, Body mass index is 38.61 kg/m., recommend weight loss, diet and exercise as appropriate   PVD s/p fem pop  Other Active Problems  Hx cervical cancer  Hospital day # 2  Rosalin Hawking, MD PhD Stroke Neurology 09/22/2019 4:55 PM   To contact Stroke Continuity provider, please refer to [email or web address] After hours, contact General Neurology

## 2019-09-22 NOTE — Discharge Summary (Addendum)
Patient ID: Katherine Carrillo   MRN: [id number]      DOB: 21-Jul-1946  Date of Admission: 09/20/2019 Date of Discharge: 09/23/2019  Attending Physician:  Rosalin Hawking, MD, Stroke MD Consultant(s):    None  Patient's PCP:  Curt Jews, PA-C  DISCHARGE DIAGNOSIS:   TIA vs. Aborted stroke s/p tPA  Active problems  History of stroke  Hyperlipidemia   Obesity   Hypertension  PVD  CKD - stage 3a  Hx of cervical cancer   Past Medical History:  Diagnosis Date  . Anemia   . Cervical cancer ([place])   . Hypertension   . Peripheral vascular disease ([place])   . Stroke [facility])    Past Surgical History:  Procedure Laterality Date  . ABDOMINAL HYSTERECTOMY    . CESAREAN SECTION    . FEMORAL ARTERY - FEMORAL ARTERY BYPASS GRAFT    . LEFT HEART CATH AND CORONARY ANGIOGRAPHY N/A 03/27/2018   Procedure: LEFT HEART CATH AND CORONARY ANGIOGRAPHY;  Surgeon: Lorretta Harp, MD;  Location: [facility];  Service: Cardiovascular;  Laterality: N/A;  . TONSILLECTOMY      Family History Family History  Problem Relation Age of Onset  . Diabetes Sister   . Diabetes Brother     Social History  reports that she has never smoked. She has never used smokeless tobacco. She reports that she does not drink alcohol or use drugs.  Allergies as of 09/23/2019      Reactions   Oxycontin [oxycodone] Other (See Comments)   Pt states it makes her "crazy"   Caffeine Anxiety   Codeine Anxiety      Medication List    TAKE these medications   aspirin 81 MG EC tablet Take 1 tablet (81 mg total) by mouth daily. Start taking on: September 24, 2019   carvedilol 12.5 MG tablet Commonly known as: COREG Take 12.5 mg by mouth 2 (two) times daily with a meal.   clopidogrel 75 MG tablet Commonly known as: PLAVIX Take 1 tablet (75 mg total) by mouth daily.   ferrous sulfate 325 (65 FE) MG EC tablet Take 325 mg by mouth daily.   Fish Oil 1000 MG Caps Take 1,000 mg by mouth at bedtime.    fluticasone 50 MCG/ACT nasal spray Commonly known as: FLONASE Place 1 spray into both nostrils daily. Start taking on: February 1, [id number]   GARLIC PO Take 1 tablet by mouth daily. Dose unknown   lisinopril 10 MG tablet Commonly known as: ZESTRIL Take 10 mg by mouth daily.   multivitamin with minerals Tabs tablet Take 1 tablet by mouth daily.   Propylene Glycol 0.6 % Soln Place 1 drop into both eyes daily.       HOME MEDICATIONS PRIOR TO ADMISSION Medications Prior to Admission  Medication Sig Dispense Refill  . carvedilol (COREG) 12.5 MG tablet Take 12.5 mg by mouth 2 (two) times daily with a meal.    . clopidogrel (PLAVIX) 75 MG tablet Take 1 tablet (75 mg total) by mouth daily. 30 tablet 2  . ferrous sulfate 325 (65 FE) MG EC tablet Take 325 mg by mouth daily.  5  . GARLIC PO Take 1 tablet by mouth daily. Dose unknown    . lisinopril (PRINIVIL,ZESTRIL) 10 MG tablet Take 10 mg by mouth daily.    . Multiple Vitamin (MULTIVITAMIN WITH MINERALS) TABS tablet Take 1 tablet by mouth daily.    . Omega-3 Fatty Acids (FISH OIL) 1000  MG CAPS Take 1,000 mg by mouth at bedtime.    Marland Kitchen Propylene Glycol 0.6 % SOLN Place 1 drop into both eyes daily.       HOSPITAL MEDICATIONS . aspirin EC  81 mg Oral Daily  . carvedilol  12.5 mg Oral BID WC  . Chlorhexidine Gluconate Cloth  6 each Topical Daily  . clopidogrel  75 mg Oral Daily  . ferrous sulfate  325 mg Oral Q breakfast  . fluticasone  1 spray Each Nare Daily  . lisinopril  10 mg Oral Daily  . multivitamin with minerals  1 tablet Oral Daily  . pantoprazole  40 mg Oral Daily    LABORATORY STUDIES CBC    Component Value Date/Time   WBC 4.1 09/23/2019 0138   RBC 3.09 (L) 09/23/2019 0138   HGB 10.1 (L) 09/23/2019 0138   HCT 30.3 (L) 09/23/2019 0138   PLT 163 09/23/2019 0138   MCV 98.1 09/23/2019 0138   MCH 32.7 09/23/2019 0138   MCHC 33.3 09/23/2019 0138   RDW 12.7 09/23/2019 0138   LYMPHSABS 0.7 09/20/2019 1611   MONOABS 0.6  09/20/2019 1611   EOSABS 0.1 09/20/2019 1611   BASOSABS 0.0 09/20/2019 1611   CMP    Component Value Date/Time   NA 137 09/23/2019 0138   K 4.7 09/23/2019 0138   CL 105 09/23/2019 0138   CO2 23 09/23/2019 0138   GLUCOSE 90 09/23/2019 0138   BUN 11 09/23/2019 0138   CREATININE 0.96 09/23/2019 0138   CALCIUM 8.4 (L) 09/23/2019 0138   PROT 7.2 09/20/2019 1611   ALBUMIN 3.7 09/20/2019 1611   AST 22 09/20/2019 1611   ALT 15 09/20/2019 1611   ALKPHOS 45 09/20/2019 1611   BILITOT 0.3 09/20/2019 1611   GFRNONAA 58 (L) 09/23/2019 0138   GFRAA >60 09/23/2019 0138   COAGS Lab Results  Component Value Date   INR 1.0 09/20/2019   INR 1.14 03/27/2018   INR 1.03 12/22/2016   Lipid Panel    Component Value Date/Time   CHOL 211 (H) 09/21/2019 0107   TRIG 54 09/21/2019 0107   HDL 53 09/21/2019 0107   CHOLHDL 4.0 09/21/2019 0107   VLDL 11 09/21/2019 0107   LDLCALC 147 (H) 09/21/2019 0107   HgbA1C  Lab Results  Component Value Date   HGBA1C 5.1 09/21/2019   Urinalysis    Component Value Date/Time   COLORURINE YELLOW 02/16/2018 0146   APPEARANCEUR HAZY (A) 02/16/2018 0146   LABSPEC <1.005 (L) 02/16/2018 0146   PHURINE 5.5 02/16/2018 0146   GLUCOSEU NEGATIVE 02/16/2018 0146   HGBUR NEGATIVE 02/16/2018 0146   BILIRUBINUR NEGATIVE 02/16/2018 0146   KETONESUR NEGATIVE 02/16/2018 0146   PROTEINUR NEGATIVE 02/16/2018 0146   NITRITE NEGATIVE 02/16/2018 0146   LEUKOCYTESUR SMALL (A) 02/16/2018 0146   Urine Drug Screen No results found for: LABOPIA, COCAINSCRNUR, LABBENZ, AMPHETMU, THCU, LABBARB  Alcohol Level No results found for: [facility]   SIGNIFICANT DIAGNOSTIC STUDIES  CT ANGIO HEAD W OR WO CONTRAST CT ANGIO NECK W OR WO CONTRAST  CT CEREBRAL PERFUSION W CONTRAST 09/20/2019 IMPRESSION:  1. Positive for a Left MCA distal M2/M3 branch occlusion in the middle division. See series 10, image 27.  2. No core infarct detected by CTP. A small volume of middle Left MCA division  ischemic penumbra is detected (up to 35 milliliters).  3. Otherwise generalized arterial tortuosity in the head and neck with mild for age atherosclerosis.  CT HEAD WO CONTRAST 09/20/2019 IMPRESSION:  Stable  from 1619 hours today. No intracranial hemorrhage or evolving left MCA infarct by CT.   MR BRAIN WO CONTRAST 09/21/2019 IMPRESSION:  1. No acute intracranial abnormality.  2. Mild chronic small vessel ischemic disease with small chronic cerebral and cerebellar infarcts as above, mildly progressed from 2018.   CT HEAD CODE STROKE WO CONTRAST 09/20/2019 IMPRESSION:  No acute intracranial hemorrhage or evidence of acute infarction. ASPECT score is 10. Age-indeterminate small right thalamic infarct. Multiple small chronic infarcts.   ECHOCARDIOGRAM COMPLETE 09/21/2019 IMPRESSIONS   1. Left ventricular ejection fraction, by visual estimation, is 55 to 60%. The left ventricle has normal function. There is no left ventricular hypertrophy.   2. Elevated left atrial pressure.   3. Left ventricular diastolic parameters are consistent with Grade I diastolic dysfunction (impaired relaxation).   4. The left ventricle has no regional wall motion abnormalities.   5. Global right ventricle has normal systolic function.The right ventricular size is normal. No increase in right ventricular wall thickness.   6. Left atrial size was normal.   7. Right atrial size was normal.   8. Mild mitral annular calcification.   9. Mild mitral valve prolapse.  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  11. The tricuspid valve is normal in structure. Tricuspid valve regurgitation is trivial.  12. Aortic valve regurgitation is mild to moderate.  13. The aortic valve is possibly bicuspid. Aortic valve regurgitation is mild to moderate. No evidence of aortic valve stenosis.  14. The pulmonic valve was not well visualized. Pulmonic valve regurgitation is not visualized.  15.  Mildly elevated pulmonary artery systolic pressure.  16. The inferior vena cava is normal in size with greater than 50% respiratory variability, suggesting right atrial pressure of 3 mmHg.    HISTORY OF PRESENT ILLNESS (From Dr Phoebe Sharps H&P on 09/20/19) Tieler Lipnick is a 74 y.o. African American female with PMH of stroke, TIA, CAD, HTN, PVD, HLD presented to ER for speech difficulty.  As per daughter, pt had conversation with daughter over the phone at 12pm and she was doing well at that time. Around 3pm, pt granddaughter called pt and found she had difficulty speaking and slurring on her words and not able to understand. Granddaughter called pt daughter who called EMS. On ER arrival, pt was found to have intelligible words, right facial droop and right arm drift. However, followed all simple commands, no weakness of the legs. CT no acute abnormalities. tPA was offered. Pt initially would like daughter to make decision on tPA, however, daughter was not available on multiple attempts of the phone calls. Eventually pt consented herself verbally for tPA.  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 Had TIA in 12/2016 with MRI negative for stroke. LDL 151, discharged with plavix and pitavastatin.  LSN: 12pm today tPA Given: Yes   HOSPITAL COURSE Ms. Adayah Genson is a 74 y.o. female with history of stroke, TIA, CAD, HTN, PVD, HLD presenting with R facial droop, R arm drift and expressive aphaisa.  Received tPA 09/20/2019 at 1630.  Stroke:  left MCA small infarct s/p tPA,  Embolic pattern, source unclear  Code Stroke CT head No acute abnormality. Small vessel disease. Age indeterminate small R thalamic infarct. ASPECTS 10.     CTA head & neck L MCA distal M2/M3 branch occlusion middle division. Atherosclerosis   CT perfusion no core infarct, small 3cc L MCA division penumbra  Repeat CT no  ICH or evolving infarct   2D Echo - EF 55 to 60%. No cardiac source of  emboli identified.   LDL 147  HgbA1c 5.1  SCDs for VTE prophylaxis  clopidogrel 75 mg daily prior to admission, now on plavix 75 and ASA 81 DAPT for 3 weeks and then plavix alone.   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 LDL 151. d/c on plavix and pitavastatin  Hypertension  Home meds:  Coreg 12.5 bid, lisinopirl 10  Stable on the high end  Resumed home BP meds  Long-term BP goal normotensive  Hyperlipidemia  Home meds:  Fish oil. On pitavastatin in the past  Intolerant to statins in the past. Recommend to evaluate for [facility]CS[facility]-9 as an OP  LDL 147, goal < 70  Continue fish oil at discharge and follow up with PCP or lipid clinic  Sinusitis, improved  Left facial mild pain due to sinusitis flare up as per pt  Not tolerating augmentin -> d/c  On Flonase nasal spray  Other Stroke Risk Factors  Advanced age  Morbid Obesity, Body mass index is 38.61 kg/m., recommend weight loss, diet and exercise as appropriate   PVD s/p fem pop  Other Active Problems  Hx cervical cancer   DISCHARGE EXAM Vitals:   09/22/19 1956 09/23/19 0030 09/23/19 0441 09/23/19 0736  BP: 115/76 135/64 137/72 (!) 149/73  Pulse: 71 65 67 70  Resp: 16  18 15   Temp: 98.4 F (36.9 C) 97.9 F (36.6 C) 98.3 F (36.8 C) 98.3 F (36.8 C)  TempSrc: Oral Oral Oral Oral  SpO2: 99% 99% 98% 100%  Weight:      Height:       General - Well nourished, well developed, in no apparent distress.  Ophthalmologic - fundi not visualized due to noncooperation.  Cardiovascular - Regular rhythm and rate.  Mental Status -  Level of arousal and orientation to time, place, and person were intact. Language including expression, naming, repetition, comprehension was assessed and found intact. Mild dysarthria due to poor denture  Cranial Nerves II - XII - II - Visual field intact  OU. III, IV, VI - Extraocular movements intact. V - Facial sensation intact bilaterally. VII - Facial movement intact bilaterally. VIII - Hearing & vestibular intact bilaterally. X - Palate elevates symmetrically. XI - Chin turning & shoulder shrug intact bilaterally. XII - Tongue protrusion intact.  Motor Strength - The patient's strength was symmetrical in all extremities. Bulk was normal and fasciculations were absent.   Motor Tone - Muscle tone was assessed at the neck and appendages and was normal.  Reflexes - The patient's reflexes were symmetrical in all extremities and she had no pathological reflexes.  Sensory - Light touch, temperature/pinprick were assessed and were symmetrical.    Coordination - The patient had normal movements in the hands with no ataxia or dysmetria.  Tremor was absent.  Gait and Station - deferred.  DISCHARGE INSTRUCTIONS TO PATIENT 1. Home Physical and Occupational therapies will be scheduled. 2. 24 hour per day supervision is recommended for safety and assistance. 3. Gradually increase activity as tolerated.  4. Follow up with your primary care provider in 2 weeks. 5. Follow up with Neurology in 4 weeks. They will call you for an appointment.  6. Continue fish oil for now but you may need additional medication. 7. Discuss weight loss  with your primary care provider. 8. Take aspirin 81 mg with Plavix 75 mg for 3 weeks - then take Plavix daily without aspirin.   DISCHARGE DIET   Diet Order            Diet Heart Room service appropriate? Yes with Assist; Fluid consistency: Thin  Diet effective now             liquids  DISCHARGE PLAN  Disposition:  Discharge to home  Aspirin 81 mg daily and clopidogrel 75 mg daily for 3 weeks then take Plavix alone for secondary stroke prevention.  Ongoing risk factor control by Primary Care Physician at time of discharge  Follow-up Curt Jews, PA-C in 2 weeks.  Follow-up in [facility] in 4 weeks, office to schedule an appointment.   40 minutes were spent preparing discharge.  Rosalin Hawking, MD PhD Stroke Neurology 09/23/2019 3:37 PM

## 2019-09-22 NOTE — Progress Notes (Signed)
MD paged and notified of BP.

## 2019-09-22 NOTE — Progress Notes (Signed)
Occupational Therapy Treatment Patient Details Name: Katherine Carrillo MRN: [id number] DOB: 1946/06/21 Today's Date: 09/22/2019    History of present illness 74 yo admitted 1/28 with facial droop, slurred speech and right arm drift s/p tPA at 1430 with acute worsening at 1745 with improvement after CT (-), await MRI. PMhx: TIA, CAD, HTN, PVD, HLD   OT comments  Pt making good progress towards OT goals this session. Pt agreeable to session this AM stating she wanted to walk to bathroom. Overall, pt required min guard assist with RW to complete functional mobility from EOB>bathroom and requires MOD A for LB dressing. Education provided on safe toilet transfers at home with [facility] as pt reports difficulty with ambulating to bathroom during the night. Educated pt on using BSC at bedside during night to ensure safe transfers. Handed off pt to PT to complete functional mobility in hallway. DC plan currently remains appropriate, will follow acutely per POC.     Follow Up Recommendations  Home health OT;Supervision/Assistance - 24 hour    Equipment Recommendations  3 in 1 bedside commode    Recommendations for Other Services      Precautions / Restrictions Precautions Precautions: Fall Precaution Comments: incontinent Restrictions Weight Bearing Restrictions: No       Mobility Bed Mobility Overal bed mobility: Needs Assistance Bed Mobility: Supine to Sit     Supine to sit: Min assist;HOB elevated     General bed mobility comments: cues for sequencing of task and light physical assist from therapist to elevate trunk into sitting  Transfers Overall transfer level: Needs assistance Equipment used: Rolling walker (2 wheeled) Transfers: Sit to/from Stand Sit to Stand: Min guard;From elevated surface         General transfer comment: pt able to power up into standing with increased effort from elevated EOB with RW. cues for hand placement for safety    Balance Overall balance  assessment: Mild deficits observed, not formally tested                                         ADL either performed or assessed with clinical judgement   ADL Overall ADL's : Needs assistance/impaired                     Lower Body Dressing: Moderate assistance;Sit to/from stand Lower Body Dressing Details (indicate cue type and reason): MOD A to don undewear from sit >stand. Assist to thread BLEs into underwear with pt able to pull up to waist line from knees in standing Toilet Transfer: Min guard;Minimal Museum/gallery curator;Ambulation;RW Toilet Transfer Details (indicate cue type and reason): functional mobility from EOB>bathroom with RW; BSC over regular toilet. cues for hand placement when descending onto surface Toileting- Clothing Manipulation and Hygiene: Minimal assistance;Sit to/from stand Toileting - Clothing Manipulation Details (indicate cue type and reason): MIN A to manage gown during pericare     Functional mobility during ADLs: Minimal assistance;Cueing for safety;Rolling walker;Min guard General ADL Comments: session focus on functional mobility with RW, toileting tasks and transfers.     Vision Patient Visual Report: No change from baseline     Perception     Praxis      Cognition Arousal/Alertness: Awake/alert Behavior During Therapy: WFL for tasks assessed/performed Overall Cognitive Status: Within Functional Limits for tasks assessed  General Comments: overall WFL for simple tasks, able to follow all commands        Exercises     Shoulder Instructions       General Comments      Pertinent Vitals/ Pain       Pain Assessment: No/denies pain  Home Living                                          Prior Functioning/Environment              Frequency  Min 2X/week        Progress Toward Goals  OT Goals(current goals can now be found in the  care plan section)  Progress towards OT goals: Progressing toward goals  Acute Rehab OT Goals Patient Stated Goal: return home, walk a mile at the track (hasn't done this for a year), read OT Goal Formulation: With patient Time For Goal Achievement: 10/05/19 Potential to Achieve Goals: Good  Plan Discharge plan remains appropriate    Co-evaluation                 AM-PAC OT "6 Clicks" Daily Activity     Outcome Measure   Help from another person eating meals?: None Help from another person taking care of personal grooming?: None Help from another person toileting, which includes using toliet, bedpan, or urinal?: A Little Help from another person bathing (including washing, rinsing, drying)?: A Little Help from another person to put on and taking off regular upper body clothing?: None Help from another person to put on and taking off regular lower body clothing?: A Little 6 Click Score: 21    End of Session Equipment Utilized During Treatment: Gait belt;Rolling walker  OT Visit Diagnosis: Unsteadiness on feet (R26.81);Other abnormalities of gait and mobility (R26.89);Muscle weakness (generalized) (M62.81);Other symptoms and signs involving the nervous system (R29.898)   Activity Tolerance Patient tolerated treatment well   Patient Left Other (comment)(passed off to PT to walk in hallway)   Nurse Communication          Time: [phone] OT Time Calculation (min): 27 min  Charges: OT General Charges $OT Visit: 1 Visit OT Treatments $Self Care/Home Management : 23-37 mins  Lanier Clam., COTA/L Acute Rehabilitation Services [phone]    Ihor Gully 09/22/2019, 9:38 AM

## 2019-09-22 NOTE — Progress Notes (Signed)
Physical Therapy Treatment Patient Details Name: Katherine Carrillo MRN: [id number] DOB: 08/30/1945 Today's Date: 09/22/2019    History of Present Illness 74 yo admitted 1/28 with facial droop, slurred speech and right arm drift s/p tPA at 1430 with acute worsening at 1745 with improvement after CT (-), await MRI. PMhx: TIA, CAD, HTN, PVD, HLD    PT Comments    Pt making good progress towards their physical therapy goals, as evidenced by improved activity tolerance and ambulation distance. Pt ambulating 200 feet with a walker; negotiated 2 steps with railings to prepare for discharge home. Displays decreased endurance, balance impairments, and weakness. Would continue to benefit from HHPT at discharge.     Follow Up Recommendations  Home health PT     Equipment Recommendations  Rolling walker with 5" wheels    Recommendations for Other Services       Precautions / Restrictions Precautions Precautions: Fall Restrictions Weight Bearing Restrictions: No    Mobility  Bed Mobility               General bed mobility comments: OOB with OT  Transfers Overall transfer level: Needs assistance Equipment used: Rolling walker (2 wheeled) Transfers: Sit to/from Stand Sit to Stand: Supervision            Ambulation/Gait Ambulation/Gait assistance: Min guard;Supervision Gait Distance (Feet): 200 Feet Assistive device: Rolling walker (2 wheeled) Gait Pattern/deviations: Step-through pattern;Decreased stride length;Trunk flexed;Wide base of support     General Gait Details: Slow and steady pace, no overt LOB   Stairs Stairs: Yes Stairs assistance: Min guard Stair Management: Two rails;Sideways;Forwards Number of Stairs: 2 General stair comments: Pt negotiating forwards ascending, sideways descending. Step by step technique   Wheelchair Mobility    Modified Rankin (Stroke Patients Only) Modified Rankin (Stroke Patients Only) Pre-Morbid Rankin Score: Moderate  disability Modified Rankin: Moderately severe disability     Balance Overall balance assessment: Mild deficits observed, not formally tested                                          Cognition Arousal/Alertness: Awake/alert Behavior During Therapy: WFL for tasks assessed/performed Overall Cognitive Status: Within Functional Limits for tasks assessed                                        Exercises      General Comments        Pertinent Vitals/Pain Pain Assessment: Faces Faces Pain Scale: No hurt    Home Living                      Prior Function            PT Goals (current goals can now be found in the care plan section) Acute Rehab PT Goals Patient Stated Goal: return home, walk a mile at the track (hasn't done this for a year), read Potential to Achieve Goals: Good Progress towards PT goals: Progressing toward goals    Frequency    Min 4X/week      PT Plan Current plan remains appropriate    Co-evaluation              AM-PAC PT "6 Clicks" Mobility   Outcome Measure  Help needed turning from your back to your side  while in a flat bed without using bedrails?: None Help needed moving from lying on your back to sitting on the side of a flat bed without using bedrails?: None Help needed moving to and from a bed to a chair (including a wheelchair)?: A Little Help needed standing up from a chair using your arms (e.g., wheelchair or bedside chair)?: A Little Help needed to walk in hospital room?: A Little Help needed climbing 3-5 steps with a railing? : A Little 6 Click Score: 20    End of Session Equipment Utilized During Treatment: Gait belt Activity Tolerance: Patient tolerated treatment well Patient left: in chair;with call bell/phone within reach Nurse Communication: Mobility status PT Visit Diagnosis: Other abnormalities of gait and mobility (R26.89);Difficulty in walking, not elsewhere classified  (R26.2)     Time: [id number] PT Time Calculation (min) (ACUTE ONLY): 12 min  Charges:  $Therapeutic Activity: 8-22 mins                       Wyona Almas, PT, DPT Acute Rehabilitation Services Pager [phone] Office [phone]    Katherine Carrillo 09/22/2019, 1:56 PM

## 2019-09-23 LAB — BASIC METABOLIC PANEL
Anion gap: 9 (ref 5–15)
BUN: 11 mg/dL (ref 8–23)
CO2: 23 mmol/L (ref 22–32)
Calcium: 8.4 mg/dL — ABNORMAL LOW (ref 8.9–10.3)
Chloride: 105 mmol/L (ref 98–111)
Creatinine, Ser: 0.96 mg/dL (ref 0.44–1.00)
GFR calc Af Amer: >60 mL/min (ref >60)
GFR calc non Af Amer: 58 mL/min — ABNORMAL LOW (ref >60)
Glucose, Bld: 90 mg/dL (ref 70–99)
Potassium: 4.7 mmol/L (ref 3.5–5.1)
Sodium: 137 mmol/L (ref 135–145)

## 2019-09-23 LAB — CBC
HCT: 30.3 % — ABNORMAL LOW (ref 36.0–46.0)
Hemoglobin: 10.1 g/dL — ABNORMAL LOW (ref 12.0–15.0)
MCH: 32.7 pg (ref 26.0–34.0)
MCHC: 33.3 g/dL (ref 30.0–36.0)
MCV: 98.1 fL (ref 80.0–100.0)
Platelets: 163 10*3/uL (ref 150–400)
RBC: 3.09 MIL/uL — ABNORMAL LOW (ref 3.87–5.11)
RDW: 12.7 % (ref 11.5–15.5)
WBC: 4.1 10*3/uL (ref 4.0–10.5)
nRBC: 0 % (ref 0.0–0.2)

## 2019-09-23 MED ORDER — FLUTICASONE PROPIONATE 50 MCG/ACT NA SUSP: 1.0000 | Freq: Every day | NASAL | 0 refills | Status: AC

## 2019-09-23 MED ORDER — ASPIRIN 81 MG PO TBEC: 81.0000 mg | DELAYED_RELEASE_TABLET | Freq: Every day | ORAL | Status: DC

## 2019-09-23 NOTE — TOC Transition Note (Signed)
Transition of Care [facility]) - CM/SW Discharge Note   Patient Details  Name: Katherine Carrillo MRN: [id number] Date of Birth: [birth year]  Transition of [facility]) CM/SW Contact:  Carles Collet, RN Phone Number: 09/23/2019, 2:18 PM   Clinical Narrative:   [name] w patient's daughter [name]. She would like [facility] for [facility] services, referral accepted. They need 3/1 and RW prior to DC. [employer] to deliver to the room.      Final next level of care: [place] Barriers to Discharge: No Barriers Identified   Patient Goals and CMS Choice Patient states their goals for this hospitalization and ongoing recovery are:: to go home CMS Medicare.gov Compare Post Acute Care list provided to:: Other (Comment Required) Choice offered to / list presented to : Adult Children  Discharge Placement                       Discharge Plan and Services                DME Arranged: 3-N-1, [place] rolling DME Agency: [employer] Date DME Agency Contacted: 09/23/19 Time DME Agency Contacted: [phone] Representative spoke with at DME Agency: [place]: PT, OT [place] Agency: [place] Date [place]: 09/23/19 Time [place]: 1418 Representative spoke with at [place]: [place] ([place]) Interventions     Readmission Risk Interventions No flowsheet data found.

## 2019-09-23 NOTE — Progress Notes (Signed)
STROKE TEAM PROGRESS NOTE   INTERVAL HISTORY Pt RN at bedside. Pt lying in bed, speech much improved, near baseline. Moving all extremities, no acute event overnight. MRI no stroke. She did not tolerate Abx yesterday and will discontinue, will order flonase nasal spray. PT OT recommend [facility] PT/OT.   Vitals:   09/22/19 1554 09/22/19 1956 09/23/19 0030 09/23/19 0441  BP: 125/67 115/76 135/64 137/72  Pulse: 77 71 65 67  Resp: (!) 22 16  18   Temp: 98.5 F (36.9 C) 98.4 F (36.9 C) 97.9 F (36.6 C) 98.3 F (36.8 C)  TempSrc: Oral Oral Oral Oral  SpO2: 100% 99% 99% 98%  Weight:      Height:        CBC:  Recent Labs  Lab 09/20/19 1611 09/20/19 1617 09/22/19 0246 09/23/19 0138  WBC 5.0   < > 4.6 4.1  NEUTROABS 3.5  --   --   --   HGB 11.8*   < > 10.7* 10.1*  HCT 36.3   < > 33.3* 30.3*  MCV 98.4   < > 99.1 98.1  PLT 154   < > 161 163   < > = values in this interval not displayed.    Basic Metabolic Panel:  Recent Labs  Lab 09/22/19 0246 09/23/19 0138  NA 134* 137  K 4.5 4.7  CL 102 105  CO2 23 23  GLUCOSE 83 90  BUN 12 11  CREATININE 1.10* 0.96  CALCIUM 8.6* 8.4*   Lipid Panel:     Component Value Date/Time   CHOL 211 (H) 09/21/2019 0107   TRIG 54 09/21/2019 0107   HDL 53 09/21/2019 0107   CHOLHDL 4.0 09/21/2019 0107   VLDL 11 09/21/2019 0107   LDLCALC 147 (H) 09/21/2019 0107   HgbA1c:  Lab Results  Component Value Date   HGBA1C 5.1 09/21/2019   Urine Drug Screen: No results found for: LABOPIA, COCAINSCRNUR, LABBENZ, AMPHETMU, THCU, LABBARB  Alcohol Level No results found for: ETH  IMAGING   CT ANGIO HEAD W OR WO CONTRAST CT ANGIO NECK W OR WO CONTRAST  CT CEREBRAL PERFUSION W CONTRAST 09/20/2019 IMPRESSION:  1. Positive for a Left MCA distal M2/M3 branch occlusion in the middle division. See series 10, image 27.  2. No core infarct detected by CTP. A small volume of middle Left MCA division ischemic penumbra is detected (up to 35 milliliters).  3.  Otherwise generalized arterial tortuosity in the head and neck with mild for age atherosclerosis.  CT HEAD WO CONTRAST 09/20/2019 IMPRESSION:  Stable from 1619 hours today. No intracranial hemorrhage or evolving left MCA infarct by CT.   MR BRAIN WO CONTRAST 09/21/2019 IMPRESSION:  1. No acute intracranial abnormality.  2. Mild chronic small vessel ischemic disease with small chronic cerebral and cerebellar infarcts as above, mildly progressed from 2018.   CT HEAD CODE STROKE WO CONTRAST 09/20/2019 IMPRESSION:  No acute intracranial hemorrhage or evidence of acute infarction. ASPECT score is 10. Age-indeterminate small right thalamic infarct. Multiple small chronic infarcts.   ECHOCARDIOGRAM COMPLETE 09/21/2019 IMPRESSIONS   1. Left ventricular ejection fraction, by visual estimation, is 55 to 60%. The left ventricle has normal function. There is no left ventricular hypertrophy.   2. Elevated left atrial pressure.   3. Left ventricular diastolic parameters are consistent with Grade I diastolic dysfunction (impaired relaxation).   4. The left ventricle has no regional wall motion abnormalities.   5. Global right ventricle has normal systolic function.The right ventricular  size is normal. No increase in right ventricular wall thickness.   6. Left atrial size was normal.   7. Right atrial size was normal.   8. Mild mitral annular calcification.   9. Mild mitral valve prolapse.  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  11. The tricuspid valve is normal in structure. Tricuspid valve regurgitation is trivial.  12. Aortic valve regurgitation is mild to moderate.  13. The aortic valve is possibly bicuspid. Aortic valve regurgitation is mild to moderate. No evidence of aortic valve stenosis.  14. The pulmonic valve was not well visualized. Pulmonic valve regurgitation is not visualized.  15. Mildly elevated pulmonary artery systolic pressure.  16. The  inferior vena cava is normal in size with greater than 50% respiratory variability, suggesting right atrial pressure of 3 mmHg.    PHYSICAL EXAM  Temp:  [97.9 F (36.6 C)-98.5 F (36.9 C)] 98.3 F (36.8 C) (01/31 0441) Pulse Rate:  [64-81] 67 (01/31 0441) Resp:  [16-22] 18 (01/31 0441) BP: (115-143)/(64-76) 137/72 (01/31 0441) SpO2:  [98 %-100 %] 98 % (01/31 0441)  General - Well nourished, well developed, in no apparent distress.  Ophthalmologic - fundi not visualized due to noncooperation.  Cardiovascular - Regular rhythm and rate.  Mental Status -  Level of arousal and orientation to time, place, and person were intact. Language including expression, naming, repetition, comprehension was assessed and found intact. Mild dysarthria due to poor denture  Cranial Nerves II - XII - II - Visual field intact OU. III, IV, VI - Extraocular movements intact. V - Facial sensation intact bilaterally. VII - Facial movement intact bilaterally. VIII - Hearing & vestibular intact bilaterally. X - Palate elevates symmetrically. XI - Chin turning & shoulder shrug intact bilaterally. XII - Tongue protrusion intact.  Motor Strength - The patient's strength was symmetrical in all extremities. Bulk was normal and fasciculations were absent.   Motor Tone - Muscle tone was assessed at the neck and appendages and was normal.  Reflexes - The patient's reflexes were symmetrical in all extremities and she had no pathological reflexes.  Sensory - Light touch, temperature/pinprick were assessed and were symmetrical.    Coordination - The patient had normal movements in the hands with no ataxia or dysmetria.  Tremor was absent.  Gait and Station - deferred.   ASSESSMENT/PLAN [name] is a 74 y.o. female with history of stroke, TIA, CAD, HTN, PVD, HLD presenting with R facial droop, R arm drift and expressive aphaisa.  Received tPA 09/20/2019 at 1630.  TIA or aborted stroke s/p tPA    Code Stroke CT head No acute abnormality. Small vessel disease. Age indeterminate small R thalamic infarct. ASPECTS 10.     CTA head & neck L MCA distal M2/M3 branch occlusion middle division. Atherosclerosis   CT perfusion no core infarct, small 3cc L MCA division penumbra  Repeat CT no ICH or evolving infarct   MRI no acute infarct  2D Echo - EF 55 to 60%. No cardiac source of emboli identified.   LDL 147  HgbA1c 5.1  SCDs for VTE prophylaxis  clopidogrel 75 mg daily prior to admission, now on plavix 75 and ASA 81 DAPT for 3 weeks and then plavix alone.   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 LDL 151. d/c on plavix and pitavastatin  Hypertension  Home meds:  Coreg 12.5 bid, lisinopirl 10  Stable on the high end BP goal < 180/105 x 24h following tPA administration Resume home BP meds . Long-term BP goal normotensive  Hyperlipidemia  Home meds:  Fish oil. On pitavastatin in the past  Intolerant to statins in the past. Recommend to evaluate for PCSK-9 as an OP  LDL 147, goal < 70  Continue fish oil at discharge and follow up with PCP or lipid clinic  Sinusitis   Left facial mild pain due to sinusitis flare up as per pt  Not tolerating augmentin -> d/c  Put on Flonase nasal spray  Other Stroke Risk Factors  Advanced age  Morbid Obesity, Body mass index is 38.61 kg/m., recommend weight loss, diet and exercise as appropriate   PVD s/p fem pop  Other Active Problems  Hx cervical cancer  Hospital day # 3    To contact Stroke Continuity provider, please refer to [email or web address] After hours, contact General Neurology

## 2019-09-23 NOTE — Discharge Instructions (Signed)
1. Home Physical and Occupational therapies will be scheduled. 2. 24 hour per day supervision is recommended for safety and assistance. 3. Gradually increase activity as tolerated.  4. Follow up with your primary care provider in 2 weeks. 5. Follow up with Neurology in 4 to 6 weeks. They will call you for an appointment.  6. Continue fish oil for now but you may need additional medication. 7. Discuss weight loss with your primary care provider. 8. Take aspirin 81 mg daily with Plavix 75 mg daily.   9. In 3 weeks (Sunday Feb 21st) stop aspirin and then take Plavix 75 mg daily without aspirin.  Hospital Discharge After a Stroke  Being discharged from the hospital after a stroke can feel overwhelming. Many things may be different, and it is normal to feel scared or anxious. Some stroke survivors may be able to return to their homes, and others may need more specialized care on a temporary or permanent basis. Your stroke care team will work with you to develop a discharge plan that is best for you. Ask questions if you do not understand something. Invite a friend or family member to participate in discharge planning. Understanding and following your discharge plan can help to prevent another stroke or other problems. Understanding your medicines After a stroke, your health care provider may prescribe one or more types of medicine. It is important to take medicines exactly as told by your health care provider. Serious harm, such as another stroke, can happen if you are unable to take your medicine exactly as prescribed. Make sure you understand:  What medicine to take.  Why you are taking the medicine.  How and when to take it.  If it can be taken with your other medicines and herbal supplements.  Possible side effects.  When to call your health care provider if you have any side effects.  How you will get and pay for your medicines. Medical assistance programs may be able to help you pay for  prescription medicines if you cannot afford them. If you are taking an anticoagulant, be sure to take it exactly as told by your health care provider. This type of medicine can increase the risk of bleeding because it works to prevent blood from clotting. You may need to take certain precautions to prevent bleeding. You should contact your health care provider if you have:  Bleeding or bruising.  A fall or other injury to your head.  Blood in your urine or stool (feces). Planning for home safety  Take steps to prevent falls, such as installing grab bars or using a shower chair. Ask a friend or family member to get needed things in place before you go home if possible. A therapist can come to your home to make recommendations for safety equipment. Ask your health care provider if you would benefit from this service or from home care. Getting needed equipment Ask your health care provider for a list of any medical equipment and supplies you will need at home. These may include items such as:  Walkers.  Canes.  Wheelchairs.  Hand-strengthening devices.  Special eating utensils. Medical equipment can be rented or purchased, depending on your insurance coverage. Check with your insurance company about what is covered. Keeping follow-up visits After a stroke, you will need to follow up regularly with a health care provider. You may also need rehabilitation, which can include physical therapy, occupational therapy, or speech-language therapy. Keeping these appointments is very important to your recovery  after a stroke. Be sure to bring your medicine list and discharge papers with you to your appointments. If you need help to keep track of your schedule, use a calendar or appointment reminder. Preventing another stroke Having a stroke puts you at risk for another stroke in the future. Ask your health care provider what actions you can take to lower the risk. These may include:  Increasing how  much you exercise.  Making a healthy eating plan.  Quitting smoking.  Managing other health conditions, such as high blood pressure, high cholesterol, or diabetes.  Limiting alcohol use. Knowing the warning signs of a stroke  Make sure you understand the signs of a stroke. Before you leave the hospital, you will receive information outlining the stroke warning signs. Share these with your friends and family members. "BE FAST" is an easy way to remember the main warning signs of a stroke:  B - Balance. Signs are dizziness, sudden trouble walking, or loss of balance.  E - Eyes. Signs are trouble seeing or a sudden change in vision.  F - Face. Signs are sudden weakness or numbness of the face, or the face or eyelid drooping on one side.  A - Arms. Signs are weakness or numbness in an arm. This happens suddenly and usually on one side of the body.  S - Speech. Signs are sudden trouble speaking, slurred speech, or trouble understanding what people say.  T - Time. Time to call emergency services. Write down what time symptoms started. Other signs of stroke may include:  A sudden, severe headache with no known cause.  Nausea or vomiting.  Seizure. These symptoms may represent a serious problem that is an emergency. Do not wait to see if the symptoms will go away. Get medical help right away. Call your local emergency services (911 in the [place] Do not drive yourself to the hospital. Make note of the time that you had your first symptoms. Your emergency responders or emergency room staff will need to know this information. Summary  Being discharged from the hospital after a stroke can feel overwhelming. It is normal to feel scared or anxious.  Make sure you take medicines exactly as told by your health care provider.  Know the warning signs of a stroke, and get help right way if you have any of these symptoms. "BE FAST" is an easy way to remember the main warning signs of a  stroke. This information is not intended to replace advice given to you by your health care provider. Make sure you discuss any questions you have with your health care provider. Document Revised: 05/02/2019 Document Reviewed: 11/12/2016 Elsevier Patient Education  2020 [place] After a Stroke, Adult A stroke causes damage to the brain cells, which can affect your ability to walk, talk, or remember things. The impact of a stroke is different for everyone, and so is recovery. Some people have progress during the first few days after treatment. Others may take weeks or longer to make progress. Stroke rehabilitation includes a variety of treatments to help you recover and promote your independence after a stroke. You may not be able do everything that you did before the stroke, but you can learn ways to manage your lifestyle and be as independent as possible. Rehabilitation will start as soon as you are able to participate after your stroke, and it involves care from a team that may include:  Family and friends. Your loved ones know you best  and can be very helpful in your recovery.  Physicians.  Nurses.  Physical and occupational therapists.  Speech-language therapists.  A nutritionist.  A psychologist.  A Education officer, museum. Keep open communication with all members of your care team. Share your medical records if needed, and take notes about each provider's recommendations. What is physical therapy? Physical therapists (PTs) help you to improve your coordination, balance, and muscle strength. Physical therapy may involve:  Range of motion exercises.  Help to move between lying, sitting, and standing positions.  Walking with a cane or walker, if needed.  Help using stairs. What is occupational therapy? Occupational therapists (OTs) help you rebuild your ability to do everyday tasks, such as brushing your teeth, going to the bathroom, eating, and getting dressed.  Occupational therapy may also help with:  Vision. Visual scanning is a technique that is used to prevent falls.  Memory and cognitive training. This therapy includes problem-solving techniques and relearning tasks like making a phone call.  Fine muscle movements such as buttoning a shirt or picking up small objects. What is speech therapy? Speech-language therapists help you communicate. After a stroke, you may have problems understanding what people are saying, or you may have trouble writing, speaking, or finding the right word for what you want to say. You may also need speech therapy if you have difficulty swallowing while eating and drinking. Examples of speech-language therapies include:  Techniques to strengthen muscles used in swallowing.  Naming objects or describing pictures. This helps retrain the brain to recognize and remember words.  Exercises to strengthen the muscles involved in talking, including your tongue and lips.  Exercises to retrain your brain in understanding what you read and hear. How often will I need therapy? Therapy will begin as soon as you are able to participate, which is often within the first few days after a stroke. Sessions will be frequent at first. For example, you may have therapy 2-3 hours a day on most days of the week during the first few months. The intensity depends on the type and severity of your stroke. You may need therapy for several months. Therapy may take place in the hospital, at a rehabilitation center, or in your home. Are there any side effects of therapy? Therapy is safe and is usually well-tolerated. You may feel physically and mentally tired after therapy, especially during the first few weeks. Rest before therapy sessions if you need to so you can get the most out of your rehabilitation. Follow these instructions at home:  Involve your family and friends in your recovery, if possible. Having another person to encourage you is  beneficial.  Follow instructions from your speech-language therapist, nutritionist, or health care provider about what you can safely eat and drink. Eat healthy foods. If your ability to swallow was affected by the stroke, you may need to take steps to avoid choking, such as: ? Taking small bites when eating. ? Eating foods that are soft or pureed. ? Drinking liquids that have been thickened.  Maintain social connections and interactions with friends, family, and community groups. This is an important part of your recovery. Communication challenges and physical challenges may cause you to feel isolated after a stroke.  Consider joining a support group that allows you to talk about the impact of stroke on your life. A psychologist or counselor may be recommended. Your emotional recovery from stroke is just as important as your physical recovery.  Keep all follow-up visits as told by your  health care providers. This is important. Summary  Stroke rehabilitation includes a variety of treatments to help you recover and promote your independence after a stroke.  Rehabilitation will start as soon as you are able to participate after your stroke, and it includes care from a team of experts.  The intensity of therapy depends on the type and severity of your stroke. You may need therapy for several months. This information is not intended to replace advice given to you by your health care provider. Make sure you discuss any questions you have with your health care provider. Document Revised: 11/29/2018 Document Reviewed: 08/10/2016 Elsevier Patient Education  [place].

## 2019-10-30 ENCOUNTER — Ambulatory Visit: Payer: Medicare Other | Admitting: Neurology

## 2019-10-30 ENCOUNTER — Telehealth: Payer: Self-pay | Admitting: *Deleted

## 2019-10-30 ENCOUNTER — Encounter: Payer: Self-pay | Admitting: Neurology

## 2019-10-30 ENCOUNTER — Other Ambulatory Visit: Payer: Self-pay

## 2019-10-30 VITALS — BP 140/71 | HR 67 | Temp 97.5°F | Ht 61.0 in | Wt 200.0 lb

## 2019-10-30 DIAGNOSIS — G459 Transient cerebral ischemic attack, unspecified: Secondary | ICD-10-CM

## 2019-10-30 DIAGNOSIS — Z6835 Body mass index (BMI) 35.0-35.9, adult: Secondary | ICD-10-CM

## 2019-10-30 DIAGNOSIS — R4701 Aphasia: Secondary | ICD-10-CM | POA: Diagnosis not present

## 2019-10-30 MED ORDER — ROSUVASTATIN CALCIUM 5 MG PO TABS: 5.0000 mg | ORAL_TABLET | Freq: Every day | ORAL | 11 refills | Status: DC

## 2019-10-30 NOTE — Telephone Encounter (Signed)
Patient enrolled for [employer] to ship a 30 day Cardiac Event monitor to her home.  Instructions will be included in the monitor kit.

## 2019-10-30 NOTE — Patient Instructions (Signed)
I had a long d/w patient about her recent stroke, risk for recurrent stroke/TIAs, personally independently reviewed imaging studies and stroke evaluation results and answered questions.Continue Plavix for secondary stroke prevention and maintain strict control of hypertension with blood pressure goal below 130/90, diabetes with hemoglobin A1c goal below 6.5% and lipids with LDL cholesterol goal below 70 mg/dL. I also advised the patient to eat a healthy diet with plenty of whole grains, cereals, fruits and vegetables, exercise regularly and maintain ideal body weight .I recommend recheck check polysomnogram for sleep apnea as well as loop recorder for paroxysmal atrial fibrillation.  I counseled her to start taking Crestor 5 mg daily for her elevated lipids and give a prescription for the same.  Followup in the future with my nurse practitioner Janett Billow in 3 months or call earlier if necessary.  Stroke Prevention Some medical conditions and behaviors are associated with a higher chance of having a stroke. You can help prevent a stroke by making nutrition, lifestyle, and other changes, including managing any medical conditions you may have. What nutrition changes can be made?   Eat healthy foods. You can do this by: ? Choosing foods high in fiber, such as fresh fruits and vegetables and whole grains. ? Eating at least 5 or more servings of fruits and vegetables a day. Try to fill half of your plate at each meal with fruits and vegetables. ? Choosing lean protein foods, such as lean cuts of meat, poultry without skin, fish, tofu, beans, and nuts. ? Eating low-fat dairy products. ? Avoiding foods that are high in salt (sodium). This can help lower blood pressure. ? Avoiding foods that have saturated fat, trans fat, and cholesterol. This can help prevent high cholesterol. ? Avoiding processed and premade foods.  Follow your health care provider's specific guidelines for losing weight, controlling high  blood pressure (hypertension), lowering high cholesterol, and managing diabetes. These may include: ? Reducing your daily calorie intake. ? Limiting your daily sodium intake to 1,500 milligrams (mg). ? Using only healthy fats for cooking, such as olive oil, canola oil, or sunflower oil. ? Counting your daily carbohydrate intake. What lifestyle changes can be made?  Maintain a healthy weight. Talk to your health care provider about your ideal weight.  Get at least 30 minutes of moderate physical activity at least 5 days a week. Moderate activity includes brisk walking, biking, and swimming.  Do not use any products that contain nicotine or tobacco, such as cigarettes and e-cigarettes. If you need help quitting, ask your health care provider. It may also be helpful to avoid exposure to secondhand smoke.  Limit alcohol intake to no more than 1 drink a day for nonpregnant women and 2 drinks a day for men. One drink equals 12 oz of beer, 5 oz of wine, or 1 oz of hard liquor.  Stop any illegal drug use.  Avoid taking birth control pills. Talk to your health care provider about the risks of taking birth control pills if: ? You are over 63 years old. ? You smoke. ? You get migraines. ? You have ever had a blood clot. What other changes can be made?  Manage your cholesterol levels. ? Eating a healthy diet is important for preventing high cholesterol. If cholesterol cannot be managed through diet alone, you may also need to take medicines. ? Take any prescribed medicines to control your cholesterol as told by your health care provider.  Manage your diabetes. ? Eating a healthy diet and exercising  regularly are important parts of managing your blood sugar. If your blood sugar cannot be managed through diet and exercise, you may need to take medicines. ? Take any prescribed medicines to control your diabetes as told by your health care provider.  Control your hypertension. ? To reduce your risk  of stroke, try to keep your blood pressure below 130/80. ? Eating a healthy diet and exercising regularly are an important part of controlling your blood pressure. If your blood pressure cannot be managed through diet and exercise, you may need to take medicines. ? Take any prescribed medicines to control hypertension as told by your health care provider. ? Ask your health care provider if you should monitor your blood pressure at home. ? Have your blood pressure checked every year, even if your blood pressure is normal. Blood pressure increases with age and some medical conditions.  Get evaluated for sleep disorders (sleep apnea). Talk to your health care provider about getting a sleep evaluation if you snore a lot or have excessive sleepiness.  Take over-the-counter and prescription medicines only as told by your health care provider. Aspirin or blood thinners (antiplatelets or anticoagulants) may be recommended to reduce your risk of forming blood clots that can lead to stroke.  Make sure that any other medical conditions you have, such as atrial fibrillation or atherosclerosis, are managed. What are the warning signs of a stroke? The warning signs of a stroke can be easily remembered as BEFAST.  B is for balance. Signs include: ? Dizziness. ? Loss of balance or coordination. ? Sudden trouble walking.  E is for eyes. Signs include: ? A sudden change in vision. ? Trouble seeing.  F is for face. Signs include: ? Sudden weakness or numbness of the face. ? The face or eyelid drooping to one side.  A is for arms. Signs include: ? Sudden weakness or numbness of the arm, usually on one side of the body.  S is for speech. Signs include: ? Trouble speaking (aphasia). ? Trouble understanding.  T is for time. ? These symptoms may represent a serious problem that is an emergency. Do not wait to see if the symptoms will go away. Get medical help right away. Call your local emergency services  (911 in the [place] Do not drive yourself to the hospital.  Other signs of stroke may include: ? A sudden, severe headache with no known cause. ? Nausea or vomiting. ? Seizure. Where to find more information For more information, visit:  American Stroke Association: www.strokeassociation.org  National Stroke Association: www.stroke.org Summary  You can prevent a stroke by eating healthy, exercising, not smoking, limiting alcohol intake, and managing any medical conditions you may have.  Do not use any products that contain nicotine or tobacco, such as cigarettes and e-cigarettes. If you need help quitting, ask your health care provider. It may also be helpful to avoid exposure to secondhand smoke.  Remember BEFAST for warning signs of stroke. Get help right away if you or a loved one has any of these signs. This information is not intended to replace advice given to you by your health care provider. Make sure you discuss any questions you have with your health care provider. Document Revised: 07/22/2017 Document Reviewed: 09/14/2016 Elsevier Patient Education  2020 [employer].

## 2019-10-30 NOTE — Progress Notes (Signed)
[facility] [address] [place]. [place] [address] [phone]       OFFICE CONSULT NOTE  Katherine Carrillo Date of Birth:  November 28, 1945 Medical Record Number:  [id number]   Referring MD:  Rosalin Hawking  Reason for Referral: Stroke HPI: [name] is a 74 year old African-American lady seen today for initial office consultation visit for stroke. History is obtained from the patient, review of electronic medical records and I personally reviewed imaging films in PACS.Katherine Brownis a 74 y.o.African Americanfemalewith PMH of stroke, TIA, CAD, HTN, PVD, HLD presented to ER for speech difficulty. As per daughter, pt had conversation with daughter over the phone at 12pm and she was doing well at that time. Around 3pm, pt granddaughter called pt and found she had difficulty speaking and slurring on her words and not able to understand. Granddaughter called pt daughter who called EMS. On ER arrival, pt was found to have intelligible words, right facial droop and right arm drift. However, followed all simple commands, no weakness of the legs. CT no acute abnormalities. tPA was offered. Pt initially would like daughter to make decision on tPA, however, daughter was not available on multiple attempts of the phone calls. Eventually pt consented herself verbally for tPA.  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 Had TIA in 12/2016 with MRI negative for stroke. LDL 151, discharged with plavix and pitavastatin.CT scan of the head during this admission showed no acute infarct and CT perfusion showed no cord infarct but only a small 3 cc of left MCA branch penumbra. Patient was not considered a candidate for intervention and CT angiogram showed only a very distal left M2/M3 branch occlusion. Patient was treated with IV TPA and did well and speech improved dramatically. She was admitted to the neurological intensive care unit for blood pressure is  tightly controlled. She made gradual improvement and subsequent MRI scan was obtained which actually showed no acute infarct. 2D echo showed normal ejection fraction. LDL cholesterol was elevated 147 mg percent and hemoglobin A1c of 5.1. Patient was started on dual antiplatelet therapy aspirin Plavix for 3 weeks and is now currently on Plavix alone. She states she is done well she is had no recurrent stroke or TIA symptoms. Speech is completely back to normal. She is tolerating aspirin well without bruising or bleeding. Patient states she was not able to tolerate Lipitor and stopped it shortly thereafter because of complaints of nausea. She has not tried other statins before. She has been eating healthy and has been losing weight gradually. She states her blood pressure is under good control today it is 140/71. 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 She does admit to snoring and feeling sleepy during the day but she has not yet been evaluated for sleep apnea  ROS:   14 system review of systems is positive for speech difficulty, knee pain, difficulty walking, imbalance, nausea, and all other systems negative PMH:  Past Medical History:  Diagnosis Date  . Anemia   . Cervical cancer ([place])   . Hypertension   . Peripheral vascular disease ([place])   . Stroke [facility])     Social History:  Social History   Socioeconomic History  . Marital status: Widowed    Spouse name: Not on file  . Number of children: Not on file  . Years of education: Not on file  . Highest education level:  Not on file  Occupational History  . Not on file  Tobacco Use  . Smoking status: Never Smoker  . Smokeless tobacco: Never Used  Substance and Sexual Activity  . Alcohol use: No  . Drug use: No  . Sexual activity: Never    Birth control/protection: Surgical  Other Topics Concern  . Not on file  Social History Narrative  . Not on file   Social Determinants of  Health   Financial Resource Strain:   . Difficulty of Paying Living Expenses: Not on file  Food Insecurity:   . Worried About Charity fundraiser in the Last Year: Not on file  . Ran Out of Food in the Last Year: Not on file  Transportation Needs:   . Lack of Transportation (Medical): Not on file  . Lack of Transportation (Non-Medical): Not on file  Physical Activity:   . Days of Exercise per Week: Not on file  . Minutes of Exercise per Session: Not on file  Stress:   . Feeling of Stress : Not on file  Social Connections:   . Frequency of Communication with Friends and Family: Not on file  . Frequency of Social Gatherings with Friends and Family: Not on file  . Attends Religious Services: Not on file  . Active Member of Clubs or Organizations: Not on file  . Attends Archivist Meetings: Not on file  . Marital Status: Not on file  Intimate Partner Violence:   . Fear of Current or Ex-Partner: Not on file  . Emotionally Abused: Not on file  . Physically Abused: Not on file  . Sexually Abused: Not on file    Medications:   Current Outpatient Medications on File Prior to Visit  Medication Sig Dispense Refill  . ALPRAZolam (XANAX) 0.25 MG tablet Take by mouth 2 (two) times daily as needed.     . carvedilol (COREG) 12.5 MG tablet Take 12.5 mg by mouth 2 (two) times daily with a meal.    . clopidogrel (PLAVIX) 75 MG tablet Take 1 tablet (75 mg total) by mouth daily. 30 tablet 2  . ergocalciferol (VITAMIN D2) 1.25 MG (50000 UT) capsule TAKE 1 CAPSULE BY MOUTH 1 TIME A WEEK    . ferrous sulfate 325 (65 FE) MG EC tablet Take 325 mg by mouth daily.  5  . fluticasone (FLONASE) 50 MCG/ACT nasal spray Place 1 spray into both nostrils daily. 9.9 mL 0  . GARLIC PO Take 1 tablet by mouth daily. Dose unknown    . lisinopril (PRINIVIL,ZESTRIL) 10 MG tablet Take 10 mg by mouth daily.    . Multiple Vitamin (MULTIVITAMIN WITH MINERALS) TABS tablet Take 1 tablet by mouth daily.    .  Omega-3 Fatty Acids (FISH OIL) 1000 MG CAPS Take 1,000 mg by mouth at bedtime.    Marland Kitchen Propylene Glycol 0.6 % SOLN Place 1 drop into both eyes daily.     No current facility-administered medications on file prior to visit.    Allergies:   Allergies  Allergen Reactions  . Oxycontin [Oxycodone] Other (See Comments)    Pt states it makes her "crazy"  . Caffeine Anxiety  . Codeine Anxiety    Physical Exam General: well developed, well nourished, seated, in no evident distress Head: head normocephalic and atraumatic.   Neck: supple with no carotid or supraclavicular bruits Cardiovascular: regular rate and rhythm, no murmurs Musculoskeletal: no deformity Skin:  no rash/petichiae Vascular:  Normal pulses all extremities  Neurologic Exam Mental  Status: Awake and fully alert. Oriented to place and time. Recent and remote memory intact. Attention span, concentration and fund of knowledge appropriate. Mood and affect appropriate.  Cranial Nerves: Fundoscopic exam reveals sharp disc margins. Pupils equal, briskly reactive to light. Extraocular movements full without nystagmus. Visual fields full to confrontation. Hearing intact. Facial sensation intact. Face, tongue, palate moves normally and symmetrically.  Motor: Normal bulk and tone. Normal strength in all tested extremity muscles. Sensory.: intact to touch , pinprick , position and vibratory sensation.  Coordination: Rapid alternating movements normal in all extremities. Finger-to-nose and heel-to-shin performed accurately bilaterally. Gait and Station: Arises from chair without difficulty. Stance is normal. Gait demonstrates normal stride length and balance . Able to heel, toe and tandem walk without difficulty.  Reflexes: 1+ and symmetric. Toes downgoing.   NIHSS 0 Modified Rankin  0   ASSESSMENT: 74 year old African-American lady with transient episode of expressive aphasia in January 2021 likely small left MCA infarct not visualized on  MRI treated with IV TPA with excellent recovery . Vascular risk factors of obesity, hypertension, hyperlipidemia and cerebrovascular disease     PLAN: I had a long d/w patient about her recent stroke, risk for recurrent stroke/TIAs, personally independently reviewed imaging studies and stroke evaluation results and answered questions.Continue Plavix for secondary stroke prevention and maintain strict control of hypertension with blood pressure goal below 130/90, diabetes with hemoglobin A1c goal below 6.5% and lipids with LDL cholesterol goal below 70 mg/dL. I also advised the patient to eat a healthy diet with plenty of whole grains, cereals, fruits and vegetables, exercise regularly and maintain ideal body weight .I recommend recheck check polysomnogram for sleep apnea as well as loop recorder for paroxysmal atrial fibrillation.  I counseled her to start taking Crestor 5 mg daily for her elevated lipids and give a prescription for the same. Greater than 50% time during this 45-minute consultation visit was spent on counseling and coordination of care about her embolic stroke followup in the future with my nurse practitioner Janett Billow in 3 months or call earlier if necessary. Antony Contras, MD  [facility] [address] [place], [place] [address]  Phone [phone] Fax [phone] Note: This document was prepared with digital dictation and possible smart phrase technology. Any transcriptional errors that result from this process are unintentional.

## 2019-11-14 ENCOUNTER — Ambulatory Visit (INDEPENDENT_AMBULATORY_CARE_PROVIDER_SITE_OTHER): Payer: Medicare Other

## 2019-11-14 DIAGNOSIS — I480 Paroxysmal atrial fibrillation: Secondary | ICD-10-CM | POA: Diagnosis not present

## 2019-11-14 DIAGNOSIS — R4701 Aphasia: Secondary | ICD-10-CM | POA: Diagnosis not present

## 2019-11-14 DIAGNOSIS — G459 Transient cerebral ischemic attack, unspecified: Secondary | ICD-10-CM | POA: Diagnosis not present

## 2019-12-26 ENCOUNTER — Other Ambulatory Visit: Payer: Self-pay | Admitting: Neurology

## 2019-12-26 DIAGNOSIS — I480 Paroxysmal atrial fibrillation: Secondary | ICD-10-CM

## 2019-12-26 DIAGNOSIS — G459 Transient cerebral ischemic attack, unspecified: Secondary | ICD-10-CM

## 2019-12-26 DIAGNOSIS — R4701 Aphasia: Secondary | ICD-10-CM

## 2019-12-31 ENCOUNTER — Other Ambulatory Visit: Payer: Self-pay

## 2019-12-31 NOTE — Patient Outreach (Signed)
First telephone outreach attempt to obtain mRS. No answer. Left message for returned call.  Ina Homes [facility] Management Assistant [phone]

## 2020-01-02 ENCOUNTER — Other Ambulatory Visit: Payer: Self-pay

## 2020-01-02 NOTE — Patient Outreach (Signed)
Second telephone outreach attempt to obtain mRS. No answer. Left message for returned call.  Geralyn Figiel [facility] Management Assistant [phone] 

## 2020-01-04 ENCOUNTER — Other Ambulatory Visit: Payer: Self-pay

## 2020-01-04 NOTE — Patient Outreach (Signed)
3 outreach attempts were completed to obtain mRs. mRs could not be obtained because patient never returned my calls. mRs=7    [facility] Management Assistant [phone]

## 2020-01-15 NOTE — Progress Notes (Signed)
Kindly inform the patient that heart monitoring study did not show any evidence of significant rhythm disturbance to worry about.

## 2020-01-30 ENCOUNTER — Telehealth: Payer: Self-pay

## 2020-01-30 NOTE — Telephone Encounter (Signed)
-----   Message from Garvin Fila, MD sent at 01/15/2020  4:29 PM EDT ----- Katherine Carrillo inform the patient that heart monitoring study did not show any evidence of significant rhythm disturbance to worry about.

## 2020-01-30 NOTE — Telephone Encounter (Signed)
LEft voice mail for patient to call back about heart monitoring results.

## 2020-01-31 NOTE — Telephone Encounter (Signed)
Carrillo,Katherine(daughter on DPR) has called, the message from Dr Leonie Man was relayed.  Daughter has not asked for a call for any questions or concerns.

## 2020-03-16 IMAGING — CT CT HEAD W/O CM
3 series · 14 of 45 positions shown, 16 images · non-contrast
Comparison: Brain MRI dated 12/22/2016

CLINICAL DATA: 72-year-old female with headache.

EXAM:
CT HEAD WITHOUT CONTRAST
TECHNIQUE: Contiguous axial images were obtained from the base of the skull
through the vertex without intravenous contrast.

[Series 2: head wo · axial · 0.46mm/px · z∈[-164,-49]mm · 8 of 28 slices shown, 10 images]
[im 3/28  brain]
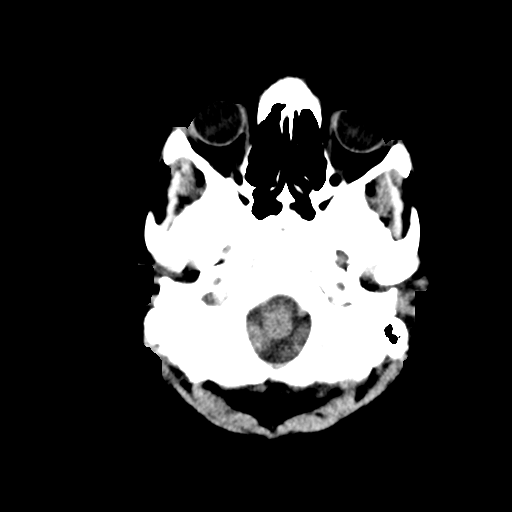
[im 3/28  bone]
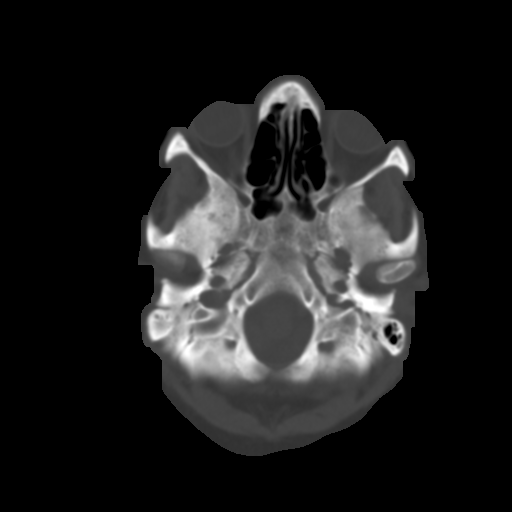
[im 6/28  brain]
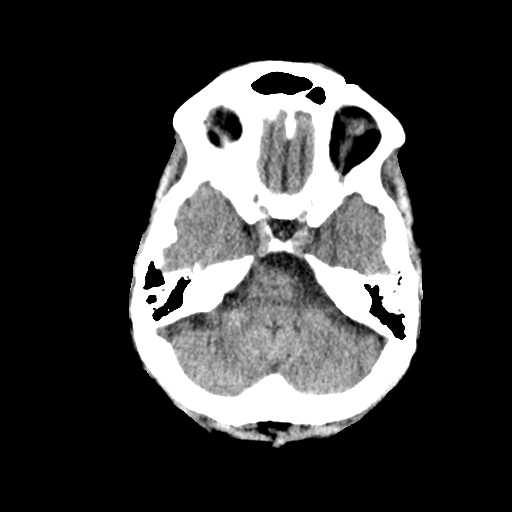
[im 10/28  brain]
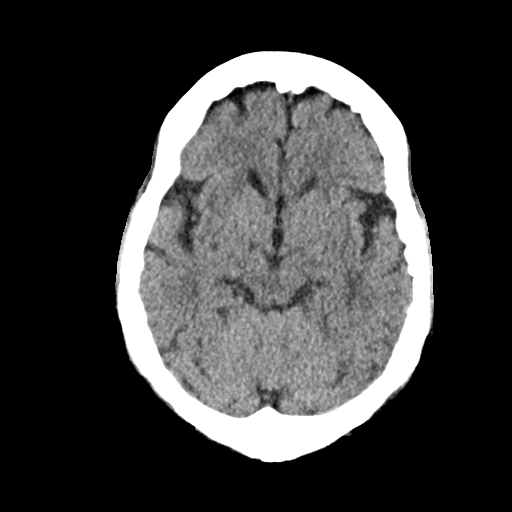
[im 13/28  brain]
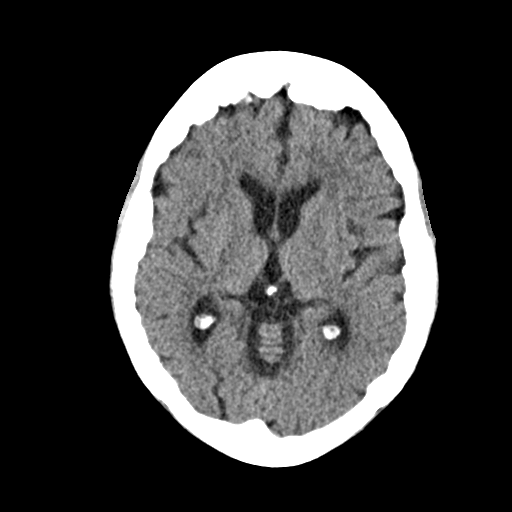
[im 16/28  brain]
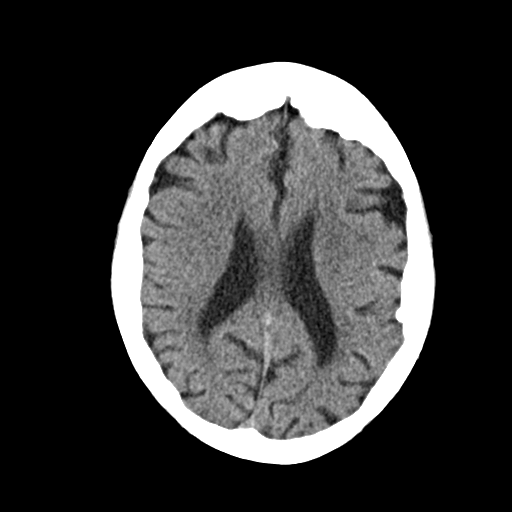
[im 16/28  bone]
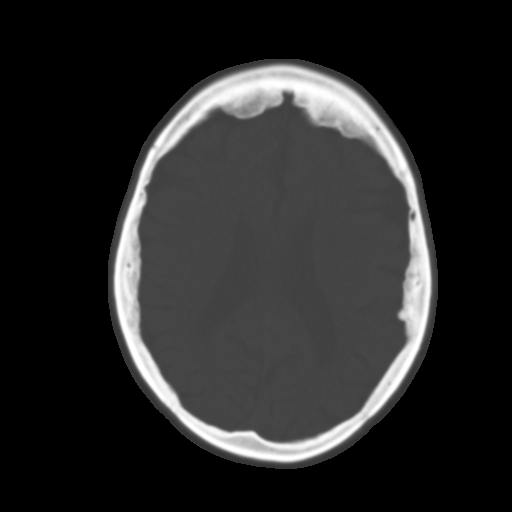
[im 19/28  brain]
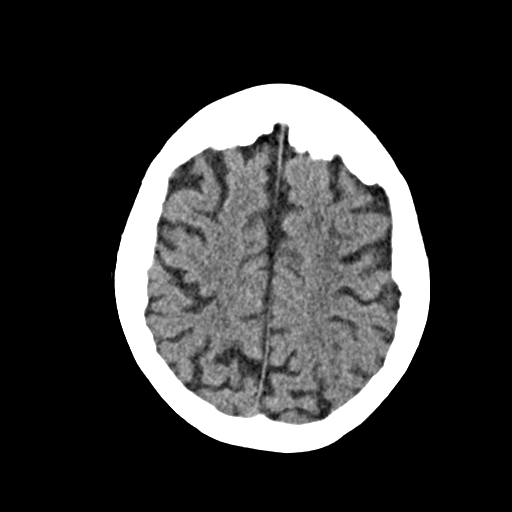
[im 23/28  brain]
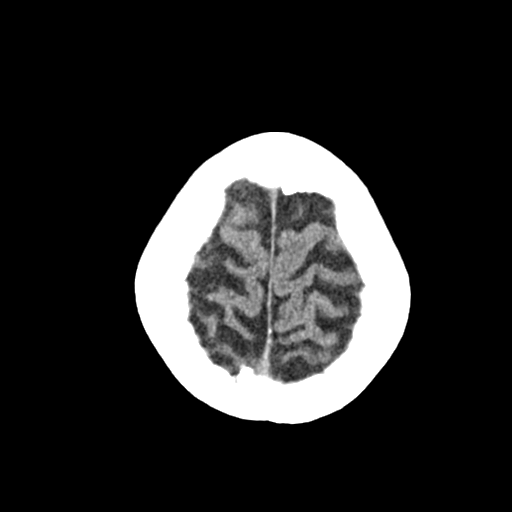
[im 26/28  brain]
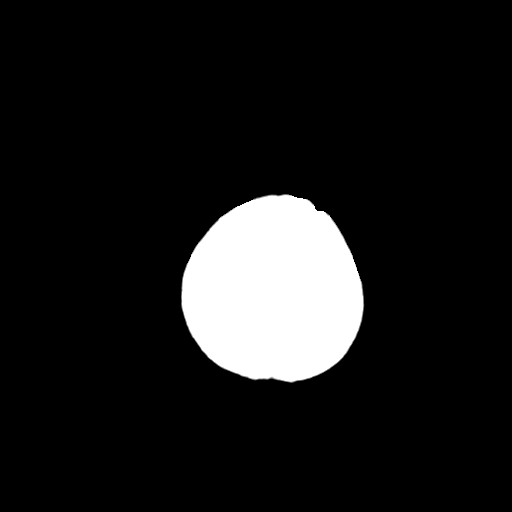

[Series 4: coronal soft · coronal · 0.28mm/px · 3 of 66 slices shown]
[im 22/66  brain]
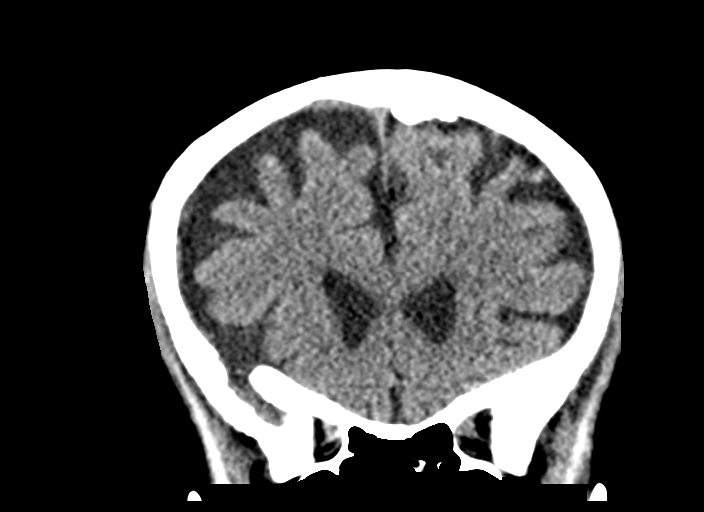
[im 29/66  brain]
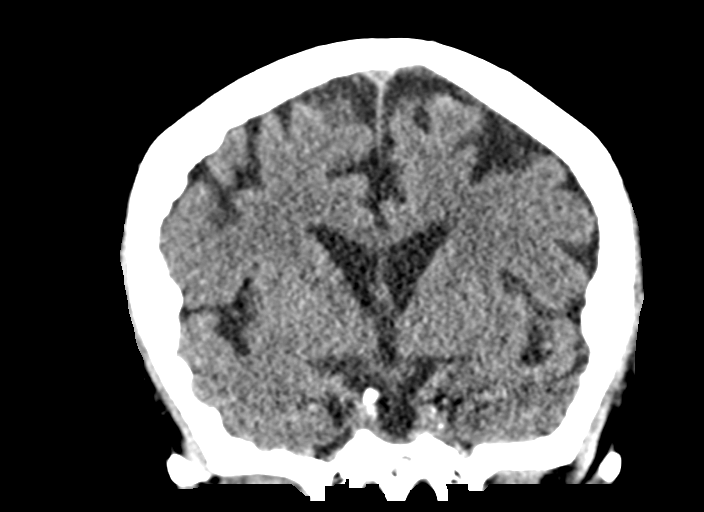
[im 37/66  brain]
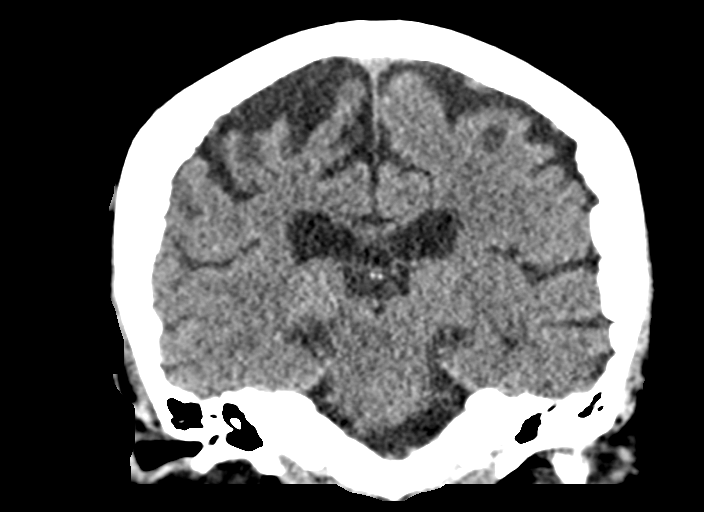

[Series 5: sag soft · sagittal · 0.29mm/px · 3 of 55 slices shown]
[im 19/55  brain]
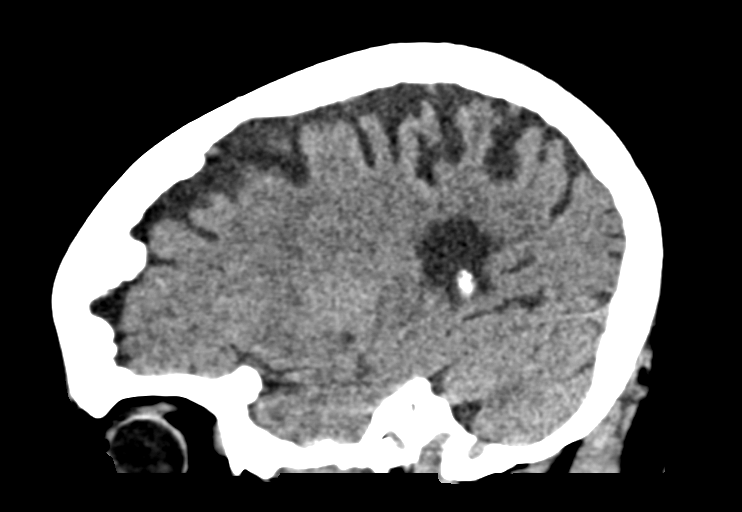
[im 28/55  brain]
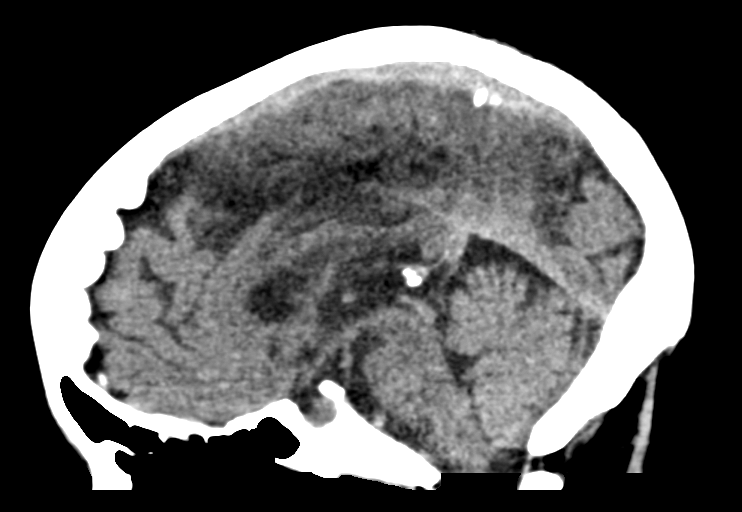
[im 37/55  brain]
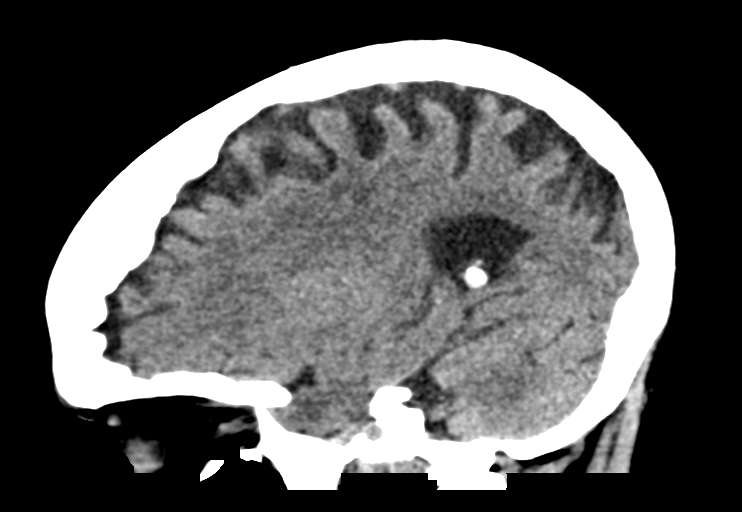

[14 of 45 positions shown; findings below may reference images not displayed]

FINDINGS: Brain: There is mild moderate age-related atrophy and chronic
microvascular ischemic changes. Small old cerebellar infarcts. There
is no acute intracranial hemorrhage. No mass effect or midline
shift. No extra-axial fluid collection.

Vascular: No hyperdense vessel or unexpected calcification.

Skull: Normal. Negative for fracture or focal lesion.

Sinuses/Orbits: No acute finding.

Other: None
IMPRESSION: 1. No acute intracranial pathology.
2. Age-related atrophy and chronic microvascular ischemic changes.

## 2021-01-22 ENCOUNTER — Other Ambulatory Visit: Payer: Self-pay

## 2021-01-22 ENCOUNTER — Emergency Department (HOSPITAL_BASED_OUTPATIENT_CLINIC_OR_DEPARTMENT_OTHER): Payer: Medicare Other

## 2021-01-22 ENCOUNTER — Inpatient Hospital Stay (HOSPITAL_BASED_OUTPATIENT_CLINIC_OR_DEPARTMENT_OTHER)
Admission: EM | Admit: 2021-01-22 | Discharge: 2021-01-25 | DRG: 065 | Disposition: A | Discharge status: Home Health | Payer: Medicare Other | Attending: Family Medicine | Admitting: Family Medicine

## 2021-01-22 ENCOUNTER — Encounter (HOSPITAL_BASED_OUTPATIENT_CLINIC_OR_DEPARTMENT_OTHER): Payer: Self-pay | Admitting: Radiology

## 2021-01-22 ENCOUNTER — Emergency Department (HOSPITAL_COMMUNITY): Payer: Medicare Other

## 2021-01-22 DIAGNOSIS — R4702 Dysphasia: Secondary | ICD-10-CM | POA: Diagnosis present

## 2021-01-22 DIAGNOSIS — E782 Mixed hyperlipidemia: Secondary | ICD-10-CM | POA: Diagnosis not present

## 2021-01-22 DIAGNOSIS — R471 Dysarthria and anarthria: Secondary | ICD-10-CM | POA: Diagnosis present

## 2021-01-22 DIAGNOSIS — R29703 NIHSS score 3: Secondary | ICD-10-CM | POA: Diagnosis present

## 2021-01-22 DIAGNOSIS — I63412 Cerebral infarction due to embolism of left middle cerebral artery: Secondary | ICD-10-CM | POA: Diagnosis not present

## 2021-01-22 DIAGNOSIS — Z833 Family history of diabetes mellitus: Secondary | ICD-10-CM | POA: Diagnosis not present

## 2021-01-22 DIAGNOSIS — Z8541 Personal history of malignant neoplasm of cervix uteri: Secondary | ICD-10-CM

## 2021-01-22 DIAGNOSIS — I110 Hypertensive heart disease with heart failure: Secondary | ICD-10-CM | POA: Diagnosis present

## 2021-01-22 DIAGNOSIS — R292 Abnormal reflex: Secondary | ICD-10-CM | POA: Diagnosis present

## 2021-01-22 DIAGNOSIS — I739 Peripheral vascular disease, unspecified: Secondary | ICD-10-CM | POA: Diagnosis present

## 2021-01-22 DIAGNOSIS — I6340 Cerebral infarction due to embolism of unspecified cerebral artery: Principal | ICD-10-CM | POA: Diagnosis present

## 2021-01-22 DIAGNOSIS — I5032 Chronic diastolic (congestive) heart failure: Secondary | ICD-10-CM | POA: Diagnosis present

## 2021-01-22 DIAGNOSIS — Z79899 Other long term (current) drug therapy: Secondary | ICD-10-CM

## 2021-01-22 DIAGNOSIS — Z8673 Personal history of transient ischemic attack (TIA), and cerebral infarction without residual deficits: Secondary | ICD-10-CM

## 2021-01-22 DIAGNOSIS — I6389 Other cerebral infarction: Secondary | ICD-10-CM | POA: Diagnosis not present

## 2021-01-22 DIAGNOSIS — R4701 Aphasia: Secondary | ICD-10-CM | POA: Diagnosis present

## 2021-01-22 DIAGNOSIS — R5381 Other malaise: Secondary | ICD-10-CM | POA: Diagnosis present

## 2021-01-22 DIAGNOSIS — Z20822 Contact with and (suspected) exposure to covid-19: Secondary | ICD-10-CM | POA: Diagnosis present

## 2021-01-22 DIAGNOSIS — G8321 Monoplegia of upper limb affecting right dominant side: Secondary | ICD-10-CM | POA: Diagnosis present

## 2021-01-22 DIAGNOSIS — I2510 Atherosclerotic heart disease of native coronary artery without angina pectoris: Secondary | ICD-10-CM | POA: Diagnosis present

## 2021-01-22 DIAGNOSIS — G4700 Insomnia, unspecified: Secondary | ICD-10-CM | POA: Diagnosis present

## 2021-01-22 DIAGNOSIS — Z7902 Long term (current) use of antithrombotics/antiplatelets: Secondary | ICD-10-CM

## 2021-01-22 DIAGNOSIS — R1310 Dysphagia, unspecified: Secondary | ICD-10-CM | POA: Diagnosis present

## 2021-01-22 DIAGNOSIS — I639 Cerebral infarction, unspecified: Secondary | ICD-10-CM | POA: Diagnosis present

## 2021-01-22 DIAGNOSIS — Z66 Do not resuscitate: Secondary | ICD-10-CM | POA: Diagnosis present

## 2021-01-22 LAB — RAPID URINE DRUG SCREEN, HOSP PERFORMED
Amphetamines: NOT DETECTED
Barbiturates: NOT DETECTED
Benzodiazepines: NOT DETECTED
Cocaine: NOT DETECTED
Opiates: NOT DETECTED
Tetrahydrocannabinol: NOT DETECTED

## 2021-01-22 LAB — CBC
HCT: 35.1 % — ABNORMAL LOW (ref 36.0–46.0)
Hemoglobin: 11.2 g/dL — ABNORMAL LOW (ref 12.0–15.0)
MCH: 32 pg (ref 26.0–34.0)
MCHC: 31.9 g/dL (ref 30.0–36.0)
MCV: 100.3 fL — ABNORMAL HIGH (ref 80.0–100.0)
Platelets: 159 10*3/uL (ref 150–400)
RBC: 3.5 MIL/uL — ABNORMAL LOW (ref 3.87–5.11)
RDW: 12.6 % (ref 11.5–15.5)
WBC: 5.1 10*3/uL (ref 4.0–10.5)
nRBC: 0 % (ref 0.0–0.2)

## 2021-01-22 LAB — URINALYSIS, ROUTINE W REFLEX MICROSCOPIC
Bilirubin Urine: NEGATIVE
Glucose, UA: NEGATIVE mg/dL
Hgb urine dipstick: NEGATIVE
Ketones, ur: NEGATIVE mg/dL
Leukocytes,Ua: NEGATIVE
Nitrite: NEGATIVE
Protein, ur: NEGATIVE mg/dL
Specific Gravity, Urine: 1.01 (ref 1.005–1.030)
pH: 7 (ref 5.0–8.0)

## 2021-01-22 LAB — DIFFERENTIAL
Abs Immature Granulocytes: 0.02 10*3/uL (ref 0.00–0.07)
Basophils Absolute: 0 10*3/uL (ref 0.0–0.1)
Basophils Relative: 1 %
Eosinophils Absolute: 0.1 10*3/uL (ref 0.0–0.5)
Eosinophils Relative: 1 %
Immature Granulocytes: 0 %
Lymphocytes Relative: 15 %
Lymphs Abs: 0.8 10*3/uL (ref 0.7–4.0)
Monocytes Absolute: 0.6 10*3/uL (ref 0.1–1.0)
Monocytes Relative: 12 %
Neutro Abs: 3.6 10*3/uL (ref 1.7–7.7)
Neutrophils Relative %: 71 %

## 2021-01-22 LAB — COMPREHENSIVE METABOLIC PANEL
ALT: 12 U/L (ref 0–44)
AST: 20 U/L (ref 15–41)
Albumin: 4 g/dL (ref 3.5–5.0)
Alkaline Phosphatase: 52 U/L (ref 38–126)
Anion gap: 8 (ref 5–15)
BUN: 17 mg/dL (ref 8–23)
CO2: 25 mmol/L (ref 22–32)
Calcium: 9.2 mg/dL (ref 8.9–10.3)
Chloride: 104 mmol/L (ref 98–111)
Creatinine, Ser: 1.02 mg/dL — ABNORMAL HIGH (ref 0.44–1.00)
GFR, Estimated: 57 mL/min — ABNORMAL LOW (ref >60)
Glucose, Bld: 100 mg/dL — ABNORMAL HIGH (ref 70–99)
Potassium: 5.1 mmol/L (ref 3.5–5.1)
Sodium: 137 mmol/L (ref 135–145)
Total Bilirubin: 0.8 mg/dL (ref 0.3–1.2)
Total Protein: 7.5 g/dL (ref 6.5–8.1)

## 2021-01-22 LAB — RESP PANEL BY RT-PCR (FLU A&B, COVID) ARPGX2
Influenza A by PCR: NEGATIVE
Influenza B by PCR: NEGATIVE
SARS Coronavirus 2 by RT PCR: NEGATIVE

## 2021-01-22 LAB — PROTIME-INR
INR: 1 (ref 0.8–1.2)
Prothrombin Time: 13.4 seconds (ref 11.4–15.2)

## 2021-01-22 LAB — CBG MONITORING, ED: Glucose-Capillary: 95 mg/dL (ref 70–99)

## 2021-01-22 LAB — APTT: aPTT: 36 seconds (ref 24–36)

## 2021-01-22 LAB — ETHANOL: Alcohol, Ethyl (B): <10 mg/dL (ref <10)

## 2021-01-22 MED ORDER — ACETAMINOPHEN 325 MG PO TABS
650.0000 mg | ORAL_TABLET | ORAL | Status: DC | PRN
  Administered 2021-01-23: 650 mg via ORAL
  Filled 2021-01-22 (×2): qty 2

## 2021-01-22 MED ORDER — IOHEXOL 350 MG/ML SOLN
100.0000 mL | Freq: Once | INTRAVENOUS | Status: AC | PRN
  Administered 2021-01-22: 100 mL via INTRAVENOUS

## 2021-01-22 MED ORDER — ACETAMINOPHEN 650 MG RE SUPP: 650.0000 mg | RECTAL | Status: DC | PRN

## 2021-01-22 MED ORDER — STROKE: EARLY STAGES OF RECOVERY BOOK
Freq: Once | Status: AC
  Filled 2021-01-22: qty 1

## 2021-01-22 MED ORDER — ACETAMINOPHEN 160 MG/5ML PO SOLN: 650.0000 mg | ORAL | Status: DC | PRN

## 2021-01-22 MED ORDER — ACETAMINOPHEN 650 MG RE SUPP
650.0000 mg | Freq: Once | RECTAL | Status: AC
  Administered 2021-01-22: 650 mg via RECTAL
  Filled 2021-01-22: qty 1

## 2021-01-22 MED ORDER — HYDRALAZINE HCL 20 MG/ML IJ SOLN
20.0000 mg | Freq: Once | INTRAMUSCULAR | Status: AC
  Administered 2021-01-22: 20 mg via INTRAVENOUS
  Filled 2021-01-22: qty 1

## 2021-01-22 MED ORDER — ACETAMINOPHEN 650 MG RE SUPP
650.0000 mg | Freq: Once | RECTAL | Status: AC
  Administered 2021-01-23: 650 mg via RECTAL
  Filled 2021-01-22: qty 1

## 2021-01-22 NOTE — ED Notes (Signed)
Pt was put on bedpan.

## 2021-01-22 NOTE — ED Notes (Signed)
Pt complaining of headache. Requesting tylenol. Failed swallow screen x2, requesting tylenol suppository

## 2021-01-22 NOTE — ED Notes (Signed)
Pt belongings provided to patients daughter. (Jewelry, clothes and dentures)

## 2021-01-22 NOTE — ED Provider Notes (Signed)
[facility] Provider Note   CSN: [id number] Arrival date & time: 01/22/21  1038  An emergency department physician performed an initial assessment on this suspected stroke patient at 67.  History Chief Complaint  Patient presents with  . Aphasia    Stroke symptoms    Katherine Carrillo is a 75 y.o. female with PMHx HTN, HLD, CVA who presents to the ED today with stroke like symptoms that began yesterday around 4 PM. LKN just prior to 4 PM. Daughter reports she was called by her sister who noticed pt was having difficulty speaking with slurring of words. They report similar symptoms last year when pt was diagnosed with a stroke however deny any residual symptoms from previous stroke. Pt has been compliant with all medications. Family has not noticed any other symptoms.   The history is provided by the patient, a relative and medical records.       Past Medical History:  Diagnosis Date  . Anemia   . Cervical cancer ([place])   . Hypertension   . Peripheral vascular disease ([place])   . Stroke [facility])     Patient Active Problem List   Diagnosis Date Noted  . Stroke ([place]) 09/20/2019  . Mixed hyperlipidemia   . Aortic atherosclerosis ([place]) 03/26/2018  . History of transient ischemic attack (TIA) 12/22/2016  . Dysarthria 12/22/2016  . Essential hypertension 12/22/2016  . History of stroke 12/22/2016  . PVD (peripheral vascular disease) ([place]) 12/22/2016    Past Surgical History:  Procedure Laterality Date  . ABDOMINAL HYSTERECTOMY    . CESAREAN SECTION    . FEMORAL ARTERY - FEMORAL ARTERY BYPASS GRAFT    . LEFT HEART CATH AND CORONARY ANGIOGRAPHY N/A 03/27/2018   Procedure: LEFT HEART CATH AND CORONARY ANGIOGRAPHY;  Surgeon: Lorretta Harp, MD;  Location: [facility];  Service: Cardiovascular;  Laterality: N/A;  . TONSILLECTOMY       OB History   No obstetric history on file.     Family History  Problem Relation Age of Onset  . Diabetes  Sister   . Diabetes Brother     Social History   Tobacco Use  . Smoking status: Never Smoker  . Smokeless tobacco: Never Used  Vaping Use  . Vaping Use: Never used  Substance Use Topics  . Alcohol use: No  . Drug use: No    Home Medications Prior to Admission medications   Medication Sig Start Date End Date Taking? Authorizing Provider  ALPRAZolam Duanne Moron) 0.25 MG tablet Take by mouth 2 (two) times daily as needed.  10/19/19   [provider]  carvedilol (COREG) 12.5 MG tablet Take 12.5 mg by mouth 2 (two) times daily with a meal.    [provider]  clopidogrel (PLAVIX) 75 MG tablet Take 1 tablet (75 mg total) by mouth daily. 12/24/16   Bonnielee Haff, MD  ergocalciferol (VITAMIN D2) 1.25 MG (50000 UT) capsule TAKE 1 CAPSULE BY MOUTH 1 TIME A WEEK 02/05/19   [provider]  ferrous sulfate 325 (65 FE) MG EC tablet Take 325 mg by mouth daily. 12/12/16   [provider]  fluticasone (FLONASE) 50 MCG/ACT nasal spray Place 1 spray into both nostrils daily. 09/24/19   Rinehuls, Early Chars, PA-C  GARLIC PO Take 1 tablet by mouth daily. Dose unknown    [provider]  lisinopril (PRINIVIL,ZESTRIL) 10 MG tablet Take 10 mg by mouth daily. 09/25/16   [provider]  Multiple Vitamin (MULTIVITAMIN  WITH MINERALS) TABS tablet Take 1 tablet by mouth daily.    [provider]  Omega-3 Fatty Acids (FISH OIL) 1000 MG CAPS Take 1,000 mg by mouth at bedtime.    [provider]  Propylene Glycol 0.6 % SOLN Place 1 drop into both eyes daily.    [provider]  rosuvastatin (CRESTOR) 5 MG tablet Take 1 tablet (5 mg total) by mouth at bedtime. 10/30/19 10/29/20  Garvin Fila, MD    Allergies    Oxycontin [oxycodone], Caffeine, and Codeine  Review of Systems   Review of Systems  Constitutional: Negative for chills and fever.  Neurological: Positive for speech difficulty. Negative for weakness and numbness.  All other systems  reviewed and are negative.   Physical Exam Updated Vital Signs BP (!) 186/104   Pulse 81   Temp 98.3 F (36.8 C) (Oral)   Resp 16   Ht 5\' 7"  (1.702 m)   Wt 90 kg   SpO2 99%   BMI 31.08 kg/m   Physical Exam Vitals and nursing note reviewed.  Constitutional:      Appearance: She is not ill-appearing or diaphoretic.  HENT:     Head: Normocephalic and atraumatic.  Eyes:     Extraocular Movements: Extraocular movements intact.     Conjunctiva/sclera: Conjunctivae normal.     Pupils: Pupils are equal, round, and reactive to light.  Cardiovascular:     Rate and Rhythm: Normal rate and regular rhythm.     Pulses: Normal pulses.  Pulmonary:     Effort: Pulmonary effort is normal.     Breath sounds: Normal breath sounds. No wheezing, rhonchi or rales.  Abdominal:     Palpations: Abdomen is soft.     Tenderness: There is no abdominal tenderness. There is no guarding or rebound.  Musculoskeletal:     Cervical back: Neck supple.  Skin:    General: Skin is warm and dry.  Neurological:     Mental Status: She is alert.     Comments: Alert and oriented to self, place, time and event.   Aphasic speech  Strength 5/5 in upper/lower extremities  Sensation intact in upper/lower extremities   No pronator drift.  Normal finger-to-nose and feet tapping.  CN I not tested  CN II grossly intact visual fields bilaterally. Did not visualize posterior eye.   CN III, IV, VI PERRLA and EOMs intact bilaterally  CN V Intact sensation to sharp and light touch to the face  CN VII facial movements symmetric  CN VIII not tested  CN IX, X no uvula deviation, symmetric rise of soft palate  CN XI 5/5 SCM and trapezius strength bilaterally  CN XII Midline tongue protrusion, symmetric L/R movements      ED Results / Procedures / Treatments   Labs (all labs ordered are listed, but only abnormal results are displayed) Labs Reviewed  CBC - Abnormal; Notable for the following components:       Result Value   RBC 3.50 (*)    Hemoglobin 11.2 (*)    HCT 35.1 (*)    MCV 100.3 (*)    All other components within normal limits  COMPREHENSIVE METABOLIC PANEL - Abnormal; Notable for the following components:   Glucose, Bld 100 (*)    Creatinine, Ser 1.02 (*)    GFR, Estimated 57 (*)    All other components within normal limits  RESP PANEL BY RT-PCR (FLU A&B, COVID) ARPGX2  ETHANOL  PROTIME-INR  APTT  DIFFERENTIAL  RAPID URINE DRUG SCREEN, HOSP PERFORMED  URINALYSIS, ROUTINE W REFLEX MICROSCOPIC  CBG MONITORING, ED    EKG EKG Interpretation  Date/Time:  Thursday January 22 2021 10:57:22 EDT Ventricular Rate:  73 PR Interval:  153 QRS Duration: 89 QT Interval:  382 QTC Calculation: 421 R Axis:   33 Text Interpretation: Sinus rhythm No significant change since last tracing Confirmed by Theotis Burrow [phone]) on 01/22/2021 11:33:46 AM   Radiology CT Angio Head W or Wo Contrast  Result Date: 01/22/2021 CLINICAL DATA:  Neuro deficit, acute, stroke suspected. Additional provided: Speech difficulty, residual right-sided weakness from prior stroke. EXAM: CT ANGIOGRAPHY HEAD AND NECK TECHNIQUE: Multidetector CT imaging of the head and neck was performed using the standard protocol during bolus administration of intravenous contrast. Multiplanar CT image reconstructions and MIPs were obtained to evaluate the vascular anatomy. Carotid stenosis measurements (when applicable) are obtained utilizing NASCET criteria, using the distal internal carotid diameter as the denominator. CONTRAST:  163mL OMNIPAQUE IOHEXOL 350 MG/ML SOLN COMPARISON:  Noncontrast head CT examinations 01/22/2021 and earlier. Brain MRI 09/21/2019. CT angiogram head/neck 09/20/2019. FINDINGS: CTA NECK FINDINGS Aortic arch: Standard aortic branching. Atherosclerotic plaque within the visualized aortic arch and proximal major branch vessels of the neck. No hemodynamically significant innominate or proximal subclavian artery  stenosis. Right carotid system: CCA and ICA patent within the neck without stenosis. No significant atherosclerotic disease. Tortuosity of both the CCA and ICA. Left carotid system: CCA and ICA patent within the neck without stenosis. No significant atherosclerotic disease. Tortuosity of both the CCA and ICA. Vertebral arteries: Vertebral arteries patent within the neck without hemodynamically significant stenosis. Mild calcified plaque at the origin of the non dominant right vertebral artery. Skeleton: Cervical spondylosis. Reversal of the expected cervical lordosis. No acute bony abnormality or aggressive osseous lesion. Other neck: No neck mass or cervical lymphadenopathy. Upper chest: No consolidation within the imaged lung apices. Review of the MIP images confirms the above findings CTA HEAD FINDINGS Anterior circulation: The intracranial internal carotid arteries are patent. Scattered Nonstenotic calcified plaque within both vessels. The M1 middle cerebral arteries are patent. No M2 proximal branch occlusion or high-grade proximal stenosis is identified. The anterior cerebral arteries are patent. No intracranial aneurysm is identified. Posterior circulation: The intracranial vertebral arteries are patent. The basilar artery is patent. The posterior cerebral arteries are patent. Hypoplastic left P1 segment with sizable left posterior communicating artery. A small right posterior communicating artery is present. Unchanged atherosclerotic irregularity of both posterior cerebral arteries without high-grade proximal stenosis. Venous sinuses: Within the limitations of contrast timing, no convincing thrombus. Anatomic variants: As described Review of the MIP images confirms the above findings IMPRESSION: CTA neck: 1. The common carotid, internal carotid and vertebral arteries are patent within the neck without hemodynamically significant stenosis. Unchanged mild calcified plaque at the origin of the non dominant  right vertebral artery. 2. Aortic Atherosclerosis (ICD10-I70.0). CTA head: 1. No intracranial large vessel occlusion or proximal high-grade arterial stenosis. 2. Mild nonstenotic calcified plaque within the intracranial internal carotid arteries. 3. Unchanged atherosclerotic irregularity of the bilateral posterior cerebral arteries. Electronically Signed   By: Kellie Simmering DO   On: 01/22/2021 13:14   CT HEAD WO CONTRAST  Result Date: 01/22/2021 CLINICAL DATA:  75 year old female with difficulty speaking, aphasia since yesterday morning. History of prior strokes, including left M2/M3 occlusion in January treated with IV tPA. EXAM: CT HEAD WITHOUT CONTRAST TECHNIQUE: Contiguous axial images were obtained from the base of the skull through the  vertex without intravenous contrast. COMPARISON:  Brain MRI 09/21/2019 and earlier. FINDINGS: Brain: Stable cerebral volume since January. No midline shift, mass effect, or evidence of intracranial mass lesion. No ventriculomegaly. No acute intracranial hemorrhage identified. Stable appearance of chronic small vessel disease in the deep gray matter nuclei and bilateral cerebellum. Stable chronic parietal lobe volume loss. No cortically based acute infarct identified. No suspicious intracranial vascular hyperdensity. Vascular: Calcified atherosclerosis at the skull base. Skull: Stable hyperostosis, normal variant. No acute osseous abnormality identified. Sinuses/Orbits: Visualized paranasal sinuses and mastoids are stable and well aerated. Other: No acute orbit or scalp soft tissue finding. IMPRESSION: 1. No acute cortically based infarct or acute intracranial hemorrhage identified. 2. Stable non contrast CT appearance of chronic small vessel ischemia since January. Electronically Signed   By: Genevie Ann M.D.   On: 01/22/2021 11:27   CT Angio Neck W and/or Wo Contrast  Result Date: 01/22/2021 CLINICAL DATA:  Neuro deficit, acute, stroke suspected. Additional provided: Speech  difficulty, residual right-sided weakness from prior stroke. EXAM: CT ANGIOGRAPHY HEAD AND NECK TECHNIQUE: Multidetector CT imaging of the head and neck was performed using the standard protocol during bolus administration of intravenous contrast. Multiplanar CT image reconstructions and MIPs were obtained to evaluate the vascular anatomy. Carotid stenosis measurements (when applicable) are obtained utilizing NASCET criteria, using the distal internal carotid diameter as the denominator. CONTRAST:  164mL OMNIPAQUE IOHEXOL 350 MG/ML SOLN COMPARISON:  Noncontrast head CT examinations 01/22/2021 and earlier. Brain MRI 09/21/2019. CT angiogram head/neck 09/20/2019. FINDINGS: CTA NECK FINDINGS Aortic arch: Standard aortic branching. Atherosclerotic plaque within the visualized aortic arch and proximal major branch vessels of the neck. No hemodynamically significant innominate or proximal subclavian artery stenosis. Right carotid system: CCA and ICA patent within the neck without stenosis. No significant atherosclerotic disease. Tortuosity of both the CCA and ICA. Left carotid system: CCA and ICA patent within the neck without stenosis. No significant atherosclerotic disease. Tortuosity of both the CCA and ICA. Vertebral arteries: Vertebral arteries patent within the neck without hemodynamically significant stenosis. Mild calcified plaque at the origin of the non dominant right vertebral artery. Skeleton: Cervical spondylosis. Reversal of the expected cervical lordosis. No acute bony abnormality or aggressive osseous lesion. Other neck: No neck mass or cervical lymphadenopathy. Upper chest: No consolidation within the imaged lung apices. Review of the MIP images confirms the above findings CTA HEAD FINDINGS Anterior circulation: The intracranial internal carotid arteries are patent. Scattered Nonstenotic calcified plaque within both vessels. The M1 middle cerebral arteries are patent. No M2 proximal branch occlusion or  high-grade proximal stenosis is identified. The anterior cerebral arteries are patent. No intracranial aneurysm is identified. Posterior circulation: The intracranial vertebral arteries are patent. The basilar artery is patent. The posterior cerebral arteries are patent. Hypoplastic left P1 segment with sizable left posterior communicating artery. A small right posterior communicating artery is present. Unchanged atherosclerotic irregularity of both posterior cerebral arteries without high-grade proximal stenosis. Venous sinuses: Within the limitations of contrast timing, no convincing thrombus. Anatomic variants: As described Review of the MIP images confirms the above findings IMPRESSION: CTA neck: 1. The common carotid, internal carotid and vertebral arteries are patent within the neck without hemodynamically significant stenosis. Unchanged mild calcified plaque at the origin of the non dominant right vertebral artery. 2. Aortic Atherosclerosis (ICD10-I70.0). CTA head: 1. No intracranial large vessel occlusion or proximal high-grade arterial stenosis. 2. Mild nonstenotic calcified plaque within the intracranial internal carotid arteries. 3. Unchanged atherosclerotic irregularity of the bilateral posterior  cerebral arteries. Electronically Signed   By: Kellie Simmering DO   On: 01/22/2021 13:14    Procedures Procedures   Medications Ordered in ED Medications  hydrALAZINE (APRESOLINE) injection 20 mg (20 mg Intravenous Given 01/22/21 1144)  iohexol (OMNIPAQUE) 350 MG/ML injection 100 mL (100 mLs Intravenous Contrast Given 01/22/21 1222)    ED Course  I have reviewed the triage vital signs and the nursing notes.  Pertinent labs & imaging results that were available during my care of the patient were reviewed by me and considered in my medical decision making (see chart for details).    MDM Rules/Calculators/A&P                          75 year old female who presents to the ED today with speech changes  that began around 4 PM yesterday.  History of stroke last year with similar symptoms.  No other deficits noted by family.  On arrival to the ED patient is afebrile, nontachycardic and nontachypneic.  She is tearful and states she is frustrated because she has difficulty speaking at this time.  She is noted to have aphasic speech on exam.  Remainder of neuro exam is intact.  Is out of the window for tPA at this time.  Will work-up for stroke.  If CT head does not show any acute findings will need to be transferred for MRI brain.   CT Head negative. Blood pressure increasing while in the ED; IV hydralazine ordered at this time.   Had initially discussed with ED Provider Dr. Roslynn Amble about transfer to [facility] for MRI however given persistent aphasia recommends neuro consult with possible admission for stroke workup given pt is still symptomatic.   Discussed case with Neurologist Dr. Rory Percy who recommends CTA Head and Neck at this facility as well as perfusion study if possible and if negative transfer for MRI.   CTAs ordered. Per CT staff we do not have the capability for perfusion studies here.   CTAs: IMPRESSION:  CTA neck:    1. The common carotid, internal carotid and vertebral arteries are  patent within the neck without hemodynamically significant stenosis.  Unchanged mild calcified plaque at the origin of the non dominant  right vertebral artery.  2. Aortic Atherosclerosis (ICD10-I70.0).    CTA head:    1. No intracranial large vessel occlusion or proximal high-grade  arterial stenosis.  2. Mild nonstenotic calcified plaque within the intracranial  internal carotid arteries.  3. Unchanged atherosclerotic irregularity of the bilateral posterior  cerebral arteries.   Will plan to transfer to [facility] for MRI at this time. Attending physician Dr. Rex Kras has evaluated patient and agrees with plan.  This note was prepared using Dragon voice recognition software and may include unintentional  dictation errors due to the inherent limitations of voice recognition software.   Final Clinical Impression(s) / ED Diagnoses Final diagnoses:  Aphasia    Rx / DC Orders ED Discharge Orders    None       Eustaquio Maize, PA-C 01/22/21 [place], Wenda Overland, MD 01/25/21 [phone]

## 2021-01-22 NOTE — ED Notes (Signed)
While drinking for swallow screen, pt taking slow sips of water. Water leaking out of right side of mouth and dripping down face. Pt unable to drink all the water for screening. No coughing or choking noted, however pt states she felt like it was difficult keeping the water in her mouth, denies difficulty swallowing.

## 2021-01-22 NOTE — ED Notes (Signed)
After speaking with patients daughter who lives with her, she states she noticed her mother having slurred speech yesterday at 4pm. States she was normal yesterday morning.

## 2021-01-22 NOTE — ED Provider Notes (Addendum)
  Physical Exam  BP (!) 145/83   Pulse 99   Temp 98.8 F (37.1 C) (Oral)   Resp 15   Ht 5\' 7"  (1.702 m)   Wt 90 kg   SpO2 100%   BMI 31.08 kg/m   Physical Exam  ED Course/Procedures     Procedures  MDM   Patient received in transfer from [place].  Difficulty speaking that woke with this morning but last normal last night.  Sounds as if the patient has both dysarthria and some aphasia.  Head CT and CTA reassuring.  Had discussed with neurology.  We will get MRI and neurology consult  MRI done and showed acute stroke.  Not a tPA candidate due to last normal was last night.  No LVO.  Will admit to internal medicine.      Davonna Belling, MD 01/22/21 1624    Davonna Belling, MD 01/22/21 Karl Bales

## 2021-01-22 NOTE — ED Notes (Signed)
IV attempt x 1 right AC unsuccessful, RN aware.

## 2021-01-22 NOTE — ED Notes (Signed)
Consulted provider at bedside

## 2021-01-22 NOTE — ED Notes (Signed)
After removing dentures, pt speech more difficult to understand d/t slurring and difficulty articulating words. Pt still able to follow commands

## 2021-01-22 NOTE — ED Notes (Signed)
IV attempt x1 to [place] unsuccessful

## 2021-01-22 NOTE — ED Notes (Signed)
Report called to charge nurse at [facility] ED, spoke with Judson Roch, RN

## 2021-01-22 NOTE — ED Notes (Signed)
Called pt daughter and she is on her way to see her mom.

## 2021-01-22 NOTE — H&P (Addendum)
[facility] Admission History and Physical Service Pager: [phone]  Patient name: Katherine Carrillo Medical record number: [id number] Date of birth: 01/28/1946 Age: 75 y.o. Gender: female  Primary Care Provider: Curt Jews, PA-C Consultants: Neurology Code Status: DNR Preferred Emergency Contact: Daughter  Chief Complaint: Speech change  Assessment and Plan: Katherine Carrillo is a 75 y.o. female presenting with stroke-like symptoms. PMH is significant for CVA, HTN, and HLD.  Stroke Started having symptoms of dysarthria and dysphasia last night.  These symptoms resolvled but then returned this morning.  BP currently in 150's-180's.  Had single reading of 201/95 and given dose of IV Hydralazine 20 mg. On Physical Exam, speech is dysarthric and aphasic.  Weakness in CN XII bilaterally, has action tremor in hands bilaterally, weakness in grip strength of right hand and decreased active ROM of fingers.  Neuro exam otherwise normal.  CBC, CMP, UA, PT, PTT, Ethanol and UDS  Unremarkable.  Patient does have history of TIA in 2009 and CVA in 2021.  Received TPA therapy in 2021. ECHO at this time was normal with EF 55-60% Takes Plavix 75 mg daily.  Also takes Lisinopril and Carvedilol but does not take statin. Patient on Plavix 75 mg daily.  CT shows no sign of hemmorage.  Patient not a candidate for TPA.  CTA neck shows common carotid, internal carotid and vertebral arteries are patent within the neck without hemodynamically significant stenosis.  MRI shows two acute cortical infarcts within the posterior left frontal lobe, measuring up to 10 mm, one of which involves the motor strip.  Otherwise stable non-contrast MRI appearance of the brain as compared to 09/21/2019.  Chronic lacunar infarcts within the left corona radiata, right thalamus and bilateral cerebellar hemispheres.  Background moderate cerebral white matter chronic small vessel ischemic disease.  Parietal lobe predominant  cerebral atrophy. Mild generalized cerebellar atrophy. Neuro consulted.  Plan to admit for stroke work-up/management.   - Admit to inpatient, Progressive, Attending Dr Erin Hearing - Neuro following, appreciate recs - follow up ECHO - follow up A1C, Lipid Panel - Cardiac Telemetry monitroing - Anticoagulation therapy per Neuro recs - PT/OT/Speech Evaluate and treat - Allow for permissive hypertension    - Vital signs q4hr - Neuro checks q4hr - NPO  HTN BP currently in 150's-180's.  Had single reading of 201/95 and given dose of IV Hydralazine 20 mg.  On CoReg 12.5 mg BID and Lisinopril daily.   - Hold home medications in setting of Permissive Hypertension  HLD LDL 151 on 12/11/2020.  Per daughter, patient not currently taking Statin. - follow up Neuro recs   FEN/GI: NPO Prophylaxis: SCD's  Disposition: Progressive  History of Present Illness:  Katherine Carrillo is a 75 y.o. female presenting with headache, slurring of words and difficulty finding words yesterday evening.  Indicates patient also complaining of headache and malaise.  Patient also endorsing insomnia.  Prior to this patient had no other symptoms/issues.  Patient does have history of TIA in 2009 and CVA in 2021.  Received TPA therapy in 2021.  Takes Plavix 75 mg daily.  Also takes Lisinopril and Carvedilol but does not take statin.   Review Of Systems: Per HPI with the following additions:  Review of Systems  Constitutional: Negative for fever.  Respiratory: Negative for shortness of breath.   Cardiovascular: Positive for leg swelling. Negative for chest pain.  Gastrointestinal: Negative for nausea and vomiting.  Neurological: Positive for weakness and headaches. Negative for seizures.  Patient Active Problem List   Diagnosis Date Noted  . Stroke ([place]) 09/20/2019  . Mixed hyperlipidemia   . Aortic atherosclerosis ([place]) 03/26/2018  . History of transient ischemic attack (TIA) 12/22/2016  . Dysarthria 12/22/2016   . Essential hypertension 12/22/2016  . History of stroke 12/22/2016  . PVD (peripheral vascular disease) ([place]) 12/22/2016    Past Medical History: Past Medical History:  Diagnosis Date  . Anemia   . Cervical cancer ([place])   . Hypertension   . Peripheral vascular disease ([place])   . Stroke [facility])     Past Surgical History: Past Surgical History:  Procedure Laterality Date  . ABDOMINAL HYSTERECTOMY    . CESAREAN SECTION    . FEMORAL ARTERY - FEMORAL ARTERY BYPASS GRAFT    . LEFT HEART CATH AND CORONARY ANGIOGRAPHY N/A 03/27/2018   Procedure: LEFT HEART CATH AND CORONARY ANGIOGRAPHY;  Surgeon: Lorretta Harp, MD;  Location: [facility];  Service: Cardiovascular;  Laterality: N/A;  . TONSILLECTOMY      Social History: Social History   Tobacco Use  . Smoking status: Never Smoker  . Smokeless tobacco: Never Used  Vaping Use  . Vaping Use: Never used  Substance Use Topics  . Alcohol use: No  . Drug use: No   Additional social history:  Please also refer to relevant sections of EMR.  Family History: Family History  Problem Relation Age of Onset  . Diabetes Sister   . Diabetes Brother     Allergies and Medications: Allergies  Allergen Reactions  . Oxycontin [Oxycodone] Other (See Comments)    Pt states it makes her "crazy"  . Caffeine Anxiety  . Codeine Anxiety   No current facility-administered medications on file prior to encounter.   Current Outpatient Medications on File Prior to Encounter  Medication Sig Dispense Refill  . ALPRAZolam (XANAX) 0.25 MG tablet Take by mouth 2 (two) times daily as needed.     . carvedilol (COREG) 12.5 MG tablet Take 12.5 mg by mouth 2 (two) times daily with a meal.    . clopidogrel (PLAVIX) 75 MG tablet Take 1 tablet (75 mg total) by mouth daily. 30 tablet 2  . ergocalciferol (VITAMIN D2) 1.25 MG (50000 UT) capsule TAKE 1 CAPSULE BY MOUTH 1 TIME A WEEK    . ferrous sulfate 325 (65 FE) MG EC tablet Take 325 mg by mouth  daily.  5  . fluticasone (FLONASE) 50 MCG/ACT nasal spray Place 1 spray into both nostrils daily. 9.9 mL 0  . GARLIC PO Take 1 tablet by mouth daily. Dose unknown    . lisinopril (PRINIVIL,ZESTRIL) 10 MG tablet Take 10 mg by mouth daily.    . Multiple Vitamin (MULTIVITAMIN WITH MINERALS) TABS tablet Take 1 tablet by mouth daily.    . Omega-3 Fatty Acids (FISH OIL) 1000 MG CAPS Take 1,000 mg by mouth at bedtime.    Marland Kitchen Propylene Glycol 0.6 % SOLN Place 1 drop into both eyes daily.    . rosuvastatin (CRESTOR) 5 MG tablet Take 1 tablet (5 mg total) by mouth at bedtime. 30 tablet 11    Objective: BP (!) 177/91 (BP Location: Right Arm)   Pulse 82   Temp 98.8 F (37.1 C) (Oral)   Resp 16   Ht 5\' 7"  (1.702 m)   Wt 90 kg   SpO2 99%   BMI 31.08 kg/m  Exam:  Physical Exam Constitutional:      General: She is not in acute distress.  Appearance: She is not ill-appearing.  HENT:     Head: Normocephalic and atraumatic.  Eyes:     Extraocular Movements: Extraocular movements intact.     Pupils: Pupils are equal, round, and reactive to light.  Cardiovascular:     Rate and Rhythm: Normal rate and regular rhythm.     Pulses: Normal pulses.  Pulmonary:     Effort: Pulmonary effort is normal.     Breath sounds: Normal breath sounds.  Musculoskeletal:     Comments: Non-pitting edema in legs bilaterally  Skin:    General: Skin is warm.  Neurological:     Mental Status: She is alert and oriented to person, place, and time.     Comments: Speech is dysarthric and aphasic,  Weakness in CN XII bilaterally, has action tremor in hands bilaterally, weakness in grip strength of right hand and decreased active ROM of fingers     Labs and Imaging: CBC BMET  Recent Labs  Lab 01/22/21 1103  WBC 5.1  HGB 11.2*  HCT 35.1*  PLT 159   Recent Labs  Lab 01/22/21 1103  NA 137  K 5.1  CL 104  CO2 25  BUN 17  CREATININE 1.02*  GLUCOSE 100*  CALCIUM 9.2     EKG: Normal sinus  rthythm  Delora Fuel, MD 01/22/2021, 9:36 PM PGY-1, [facility] pager: [phone], text pages welcome

## 2021-01-22 NOTE — ED Triage Notes (Addendum)
Pt states beginning yesterday having difficulty speaking, states happened one year ago with similar symptoms when she had a stroke, residual right sided weakness from previous stroke.  Last known well at 7pm 5/31 when she went to bed.  Awoke with symptoms yesterday.  Arrives in w/c with family present

## 2021-01-22 NOTE — ED Notes (Signed)
[facility] at bedside for transfer to [facility] ER

## 2021-01-22 NOTE — ED Notes (Signed)
Pt taken off the bedpan.

## 2021-01-22 NOTE — Consult Note (Signed)
Referring Physician: Dr. Alvino Chapel    Chief Complaint: Speech deficit  HPI: Katherine Carrillo is an 75 y.o. female with a PMHx of HTN, anemia, PVD and stroke who presented to the hospital on Thursday morning with dysphasia. She stated that this happened one year ago with similar symptoms when she had a stroke. She has residual right sided weakness from her previous stroke.  LKN was 7 pm on 5/31 when she went to bed.  She awoke with symptoms on Wednesday. MRI revealed acute posterior left frontal lobe ischemic infarctions.   MRI brain: - Two acute cortical infarcts within the posterior left frontal lobe measuring up to 10 mm, one of which involves the motor strip. - Otherwise stable non-contrast MRI appearance of the brain as compared to 09/21/2019. - Chronic lacunar infarcts within the left corona radiata, right thalamus and bilateral cerebellar hemispheres. - Background moderate cerebral white matter chronic small vessel ischemic disease. - Parietal lobe predominant cerebral atrophy. Mild generalized cerebellar atrophy.   Past Medical History:  Diagnosis Date  . Anemia   . Cervical cancer ([place])   . Hypertension   . Peripheral vascular disease ([place])   . Stroke [facility])     Past Surgical History:  Procedure Laterality Date  . ABDOMINAL HYSTERECTOMY    . CESAREAN SECTION    . FEMORAL ARTERY - FEMORAL ARTERY BYPASS GRAFT    . LEFT HEART CATH AND CORONARY ANGIOGRAPHY N/A 03/27/2018   Procedure: LEFT HEART CATH AND CORONARY ANGIOGRAPHY;  Surgeon: Lorretta Harp, MD;  Location: [facility];  Service: Cardiovascular;  Laterality: N/A;  . TONSILLECTOMY      Family History  Problem Relation Age of Onset  . Diabetes Sister   . Diabetes Brother    Social History:  reports that she has never smoked. She has never used smokeless tobacco. She reports that she does not drink alcohol and does not use drugs.  Allergies:  Allergies  Allergen Reactions  . Oxycontin [Oxycodone] Other  (See Comments)    Pt states it makes her "crazy"  . Caffeine Anxiety  . Codeine Anxiety    Medications:  Prior to Admission:  Medications Prior to Admission  Medication Sig Dispense Refill Last Dose  . ALPRAZolam (XANAX) 0.25 MG tablet Take by mouth 2 (two) times daily as needed.      . carvedilol (COREG) 12.5 MG tablet Take 12.5 mg by mouth 2 (two) times daily with a meal.     . clopidogrel (PLAVIX) 75 MG tablet Take 1 tablet (75 mg total) by mouth daily. 30 tablet 2   . ergocalciferol (VITAMIN D2) 1.25 MG (50000 UT) capsule TAKE 1 CAPSULE BY MOUTH 1 TIME A WEEK     . ferrous sulfate 325 (65 FE) MG EC tablet Take 325 mg by mouth daily.  5   . fluticasone (FLONASE) 50 MCG/ACT nasal spray Place 1 spray into both nostrils daily. 9.9 mL 0   . GARLIC PO Take 1 tablet by mouth daily. Dose unknown     . lisinopril (PRINIVIL,ZESTRIL) 10 MG tablet Take 10 mg by mouth daily.     . Multiple Vitamin (MULTIVITAMIN WITH MINERALS) TABS tablet Take 1 tablet by mouth daily.     . Omega-3 Fatty Acids (FISH OIL) 1000 MG CAPS Take 1,000 mg by mouth at bedtime.     Marland Kitchen Propylene Glycol 0.6 % SOLN Place 1 drop into both eyes daily.     . rosuvastatin (CRESTOR) 5 MG tablet Take 1 tablet (5  mg total) by mouth at bedtime. 30 tablet 11    Scheduled: .  stroke: mapping our early stages of recovery book   Does not apply Once  . acetaminophen  650 mg Rectal Once    ROS: The patient complains of an 8/10 left frontal headache. Other ROS as per HPI. She has no additional complaints.   Physical Examination: Blood pressure (!) 145/83, pulse 99, temperature 98.8 F (37.1 C), temperature source Oral, resp. rate 15, height 5\' 7"  (1.702 m), weight 90 kg, SpO2 100 %.  HEENT: [place]/AT Lungs: Respirations unlabored Ext: Warm and well perfused  Neurologic Examination: Mental Status: Awake and alert. Stuttering speech is noted, with mild dysarthria; in this context speech is fluent without errors of grammar or syntax.  Naming and repetition intact. Comprehension intact; able to follow all commands without difficulty. Oriented x 5.  Cranial Nerves: II:  Temporal visual fields intact without extinction to DSS. Closes eyes repeatedly when attempting to assess pupils.  III,IV, VI: No ptosis. EOMI. No nystagmus.  V,VII: Smile symmetric, facial temp sensation equal bilaterally VIII: Hearing intact to voice IX,X: No hypophonia XI: Symmetric XII: midline tongue extension  Motor: RUE with coarse action tremor and 4/5 grip strength, with 5/5 strength more proximally. Mild bradykinesia to RUE, in addition to drift without pronation.  LUE 5/5; there is horizontal drift that is also slightly upwards when testing [place] (left cerebellar drift) RLE and LLE: 5/5 Sensory: Temp and light touch intact throughout, bilaterally. No extinction to DSS.  Deep Tendon Reflexes:  2+ bilateral brachioradialis and biceps 3+ bilateral patellae Cerebellar: Bradykinetic on the right without definite ataxia. Normal FNF on the left.  Gait: Deferred  Results for orders placed or performed during the hospital encounter of 01/22/21 (from the past 48 hour(s))  CBG monitoring, ED     Status: None   Collection Time: 01/22/21 10:49 AM  Result Value Ref Range   Glucose-Capillary 95 70 - 99 mg/dL    Comment: Glucose reference range applies only to samples taken after fasting for at least 8 hours.  Urinalysis, Routine w reflex microscopic     Status: None   Collection Time: 01/22/21 10:54 AM  Result Value Ref Range   Color, Urine YELLOW YELLOW   APPearance CLEAR CLEAR   Specific Gravity, Urine 1.010 1.005 - 1.030   pH 7.0 5.0 - 8.0   Glucose, UA NEGATIVE NEGATIVE mg/dL   Hgb urine dipstick NEGATIVE NEGATIVE   Bilirubin Urine NEGATIVE NEGATIVE   Ketones, ur NEGATIVE NEGATIVE mg/dL   Protein, ur NEGATIVE NEGATIVE mg/dL   Nitrite NEGATIVE NEGATIVE   Leukocytes,Ua NEGATIVE NEGATIVE    Comment: Microscopic not done on urines with negative  protein, blood, leukocytes, nitrite, or glucose < 500 mg/dL. Performed at [facility], [address] [place], [place] [address]   Resp Panel by RT-PCR (Flu A&B, Covid) Nasopharyngeal Swab     Status: None   Collection Time: 01/22/21 11:03 AM   Specimen: Nasopharyngeal Swab; Nasopharyngeal(NP) swabs in vial transport medium  Result Value Ref Range   SARS Coronavirus 2 by RT PCR NEGATIVE NEGATIVE    Comment: (NOTE) SARS-CoV-2 target nucleic acids are NOT DETECTED.  The SARS-CoV-2 RNA is generally detectable in upper respiratory specimens during the acute phase of infection. The lowest concentration of SARS-CoV-2 viral copies this assay can detect is 138 copies/mL. A negative result does not preclude SARS-Cov-2 infection and should not be used as the sole basis for treatment or other patient  management decisions. A negative result may occur with  improper specimen collection/handling, submission of specimen other than nasopharyngeal swab, presence of viral mutation(s) within the areas targeted by this assay, and inadequate number of viral copies(<138 copies/mL). A negative result must be combined with clinical observations, patient history, and epidemiological information. The expected result is Negative.  Fact Sheet for Patients:  EntrepreneurPulse.com.au  Fact Sheet for Healthcare Providers:  IncredibleEmployment.be  This test is no t yet approved or cleared by the [place] FDA and  has been authorized for detection and/or diagnosis of SARS-CoV-2 by FDA under an Emergency Use Authorization (EUA). This EUA will remain  in effect (meaning this test can be used) for the duration of the COVID-19 declaration under Section 564(b)(1) of the Act, 21 U.S.C.section 360bbb-3(b)(1), unless the authorization is terminated  or revoked sooner.       Influenza A by PCR NEGATIVE NEGATIVE   Influenza B by PCR NEGATIVE NEGATIVE    Comment:  (NOTE) The Xpert Xpress SARS-CoV-2/FLU/RSV plus assay is intended as an aid in the diagnosis of influenza from Nasopharyngeal swab specimens and should not be used as a sole basis for treatment. Nasal washings and aspirates are unacceptable for Xpert Xpress SARS-CoV-2/FLU/RSV testing.  Fact Sheet for Patients: EntrepreneurPulse.com.au  Fact Sheet for Healthcare Providers: IncredibleEmployment.be  This test is not yet approved or cleared by the [place] FDA and has been authorized for detection and/or diagnosis of SARS-CoV-2 by FDA under an Emergency Use Authorization (EUA). This EUA will remain in effect (meaning this test can be used) for the duration of the COVID-19 declaration under Section 564(b)(1) of the Act, 21 U.S.C. section 360bbb-3(b)(1), unless the authorization is terminated or revoked.  Performed at [facility], [address] [place], [place] [address]   Ethanol     Status: None   Collection Time: 01/22/21 11:03 AM  Result Value Ref Range   Alcohol, Ethyl (B) <10 <10 mg/dL    Comment:        LOWEST DETECTABLE LIMIT FOR SERUM ALCOHOL IS 10 mg/dL FOR MEDICAL PURPOSES ONLY Performed at [facility], [address] [place], [place] [address]   Protime-INR     Status: None   Collection Time: 01/22/21 11:03 AM  Result Value Ref Range   Prothrombin Time 13.4 11.4 - 15.2 seconds   INR 1.0 0.8 - 1.2    Comment: (NOTE) INR goal varies based on device and disease states. Performed at [facility], [address] [place], [place] [address]   APTT     Status: None   Collection Time: 01/22/21 11:03 AM  Result Value Ref Range   aPTT 36 24 - 36 seconds    Comment: Performed at [facility], [address] [place], [place] [address]  CBC     Status: Abnormal   Collection Time: 01/22/21 11:03 AM  Result Value Ref Range   WBC 5.1 4.0 - 10.5 K/uL   RBC 3.50 (L) 3.87 - 5.11 MIL/uL    Hemoglobin 11.2 (L) 12.0 - 15.0 g/dL   HCT 35.1 (L) 36.0 - 46.0 %   MCV 100.3 (H) 80.0 - 100.0 fL   MCH 32.0 26.0 - 34.0 pg   MCHC 31.9 30.0 - 36.0 g/dL   RDW 12.6 11.5 - 15.5 %   Platelets 159 150 - 400 K/uL   nRBC 0.0 0.0 - 0.2 %    Comment: Performed at [facility]  [address], [address] [place], [place] [address]  Differential     Status: None   Collection Time: 01/22/21 11:03 AM  Result Value Ref Range   Neutrophils Relative % 71 %   Neutro Abs 3.6 1.7 - 7.7 K/uL   Lymphocytes Relative 15 %   Lymphs Abs 0.8 0.7 - 4.0 K/uL   Monocytes Relative 12 %   Monocytes Absolute 0.6 0.1 - 1.0 K/uL   Eosinophils Relative 1 %   Eosinophils Absolute 0.1 0.0 - 0.5 K/uL   Basophils Relative 1 %   Basophils Absolute 0.0 0.0 - 0.1 K/uL   Immature Granulocytes 0 %   Abs Immature Granulocytes 0.02 0.00 - 0.07 K/uL    Comment: Performed at [facility], [address] [place], [place] [address]  Comprehensive metabolic panel     Status: Abnormal   Collection Time: 01/22/21 11:03 AM  Result Value Ref Range   Sodium 137 135 - 145 mmol/L   Potassium 5.1 3.5 - 5.1 mmol/L   Chloride 104 98 - 111 mmol/L   CO2 25 22 - 32 mmol/L   Glucose, Bld 100 (H) 70 - 99 mg/dL    Comment: Glucose reference range applies only to samples taken after fasting for at least 8 hours.   BUN 17 8 - 23 mg/dL   Creatinine, Ser 1.02 (H) 0.44 - 1.00 mg/dL   Calcium 9.2 8.9 - 10.3 mg/dL   Total Protein 7.5 6.5 - 8.1 g/dL   Albumin 4.0 3.5 - 5.0 g/dL   AST 20 15 - 41 U/L   ALT 12 0 - 44 U/L   Alkaline Phosphatase 52 38 - 126 U/L   Total Bilirubin 0.8 0.3 - 1.2 mg/dL   GFR, Estimated 57 (L) >60 mL/min    Comment: (NOTE) Calculated using the CKD-EPI Creatinine Equation (2021)    Anion gap 8 5 - 15    Comment: Performed at [facility], [address] [place], [place] [address]  Urine rapid drug screen (hosp performed)     Status: None   Collection Time: 01/22/21 12:14 PM  Result Value Ref  Range   Opiates NONE DETECTED NONE DETECTED   Cocaine NONE DETECTED NONE DETECTED   Benzodiazepines NONE DETECTED NONE DETECTED   Amphetamines NONE DETECTED NONE DETECTED   Tetrahydrocannabinol NONE DETECTED NONE DETECTED   Barbiturates NONE DETECTED NONE DETECTED    Comment: (NOTE) DRUG SCREEN FOR MEDICAL PURPOSES ONLY.  IF CONFIRMATION IS NEEDED FOR ANY PURPOSE, NOTIFY LAB WITHIN 5 DAYS.  LOWEST DETECTABLE LIMITS FOR URINE DRUG SCREEN Drug Class                     Cutoff (ng/mL) Amphetamine and metabolites    1000 Barbiturate and metabolites    200 Benzodiazepine                 638 Tricyclics and metabolites     300 Opiates and metabolites        300 Cocaine and metabolites        300 THC                            50 Performed at [facility], [address] [place], [place] [address]    CT Angio Head W or Wo Contrast  Result Date: 01/22/2021 CLINICAL DATA:  Neuro deficit, acute, stroke suspected. Additional provided: Speech  difficulty, residual right-sided weakness from prior stroke. EXAM: CT ANGIOGRAPHY HEAD AND NECK TECHNIQUE: Multidetector CT imaging of the head and neck was performed using the standard protocol during bolus administration of intravenous contrast. Multiplanar CT image reconstructions and MIPs were obtained to evaluate the vascular anatomy. Carotid stenosis measurements (when applicable) are obtained utilizing NASCET criteria, using the distal internal carotid diameter as the denominator. CONTRAST:  187mL OMNIPAQUE IOHEXOL 350 MG/ML SOLN COMPARISON:  Noncontrast head CT examinations 01/22/2021 and earlier. Brain MRI 09/21/2019. CT angiogram head/neck 09/20/2019. FINDINGS: CTA NECK FINDINGS Aortic arch: Standard aortic branching. Atherosclerotic plaque within the visualized aortic arch and proximal major branch vessels of the neck. No hemodynamically significant innominate or proximal subclavian artery stenosis. Right carotid system: CCA and ICA patent  within the neck without stenosis. No significant atherosclerotic disease. Tortuosity of both the CCA and ICA. Left carotid system: CCA and ICA patent within the neck without stenosis. No significant atherosclerotic disease. Tortuosity of both the CCA and ICA. Vertebral arteries: Vertebral arteries patent within the neck without hemodynamically significant stenosis. Mild calcified plaque at the origin of the non dominant right vertebral artery. Skeleton: Cervical spondylosis. Reversal of the expected cervical lordosis. No acute bony abnormality or aggressive osseous lesion. Other neck: No neck mass or cervical lymphadenopathy. Upper chest: No consolidation within the imaged lung apices. Review of the MIP images confirms the above findings CTA HEAD FINDINGS Anterior circulation: The intracranial internal carotid arteries are patent. Scattered Nonstenotic calcified plaque within both vessels. The M1 middle cerebral arteries are patent. No M2 proximal branch occlusion or high-grade proximal stenosis is identified. The anterior cerebral arteries are patent. No intracranial aneurysm is identified. Posterior circulation: The intracranial vertebral arteries are patent. The basilar artery is patent. The posterior cerebral arteries are patent. Hypoplastic left P1 segment with sizable left posterior communicating artery. A small right posterior communicating artery is present. Unchanged atherosclerotic irregularity of both posterior cerebral arteries without high-grade proximal stenosis. Venous sinuses: Within the limitations of contrast timing, no convincing thrombus. Anatomic variants: As described Review of the MIP images confirms the above findings IMPRESSION: CTA neck: 1. The common carotid, internal carotid and vertebral arteries are patent within the neck without hemodynamically significant stenosis. Unchanged mild calcified plaque at the origin of the non dominant right vertebral artery. 2. Aortic Atherosclerosis  (ICD10-I70.0). CTA head: 1. No intracranial large vessel occlusion or proximal high-grade arterial stenosis. 2. Mild nonstenotic calcified plaque within the intracranial internal carotid arteries. 3. Unchanged atherosclerotic irregularity of the bilateral posterior cerebral arteries. Electronically Signed   By: Kellie Simmering DO   On: 01/22/2021 13:14   CT HEAD WO CONTRAST  Result Date: 01/22/2021 CLINICAL DATA:  75 year old female with difficulty speaking, aphasia since yesterday morning. History of prior strokes, including left M2/M3 occlusion in January treated with IV tPA. EXAM: CT HEAD WITHOUT CONTRAST TECHNIQUE: Contiguous axial images were obtained from the base of the skull through the vertex without intravenous contrast. COMPARISON:  Brain MRI 09/21/2019 and earlier. FINDINGS: Brain: Stable cerebral volume since January. No midline shift, mass effect, or evidence of intracranial mass lesion. No ventriculomegaly. No acute intracranial hemorrhage identified. Stable appearance of chronic small vessel disease in the deep gray matter nuclei and bilateral cerebellum. Stable chronic parietal lobe volume loss. No cortically based acute infarct identified. No suspicious intracranial vascular hyperdensity. Vascular: Calcified atherosclerosis at the skull base. Skull: Stable hyperostosis, normal variant. No acute osseous abnormality identified. Sinuses/Orbits: Visualized paranasal sinuses and mastoids are stable and well aerated. Other:  No acute orbit or scalp soft tissue finding. IMPRESSION: 1. No acute cortically based infarct or acute intracranial hemorrhage identified. 2. Stable non contrast CT appearance of chronic small vessel ischemia since January. Electronically Signed   By: Genevie Ann M.D.   On: 01/22/2021 11:27   CT Angio Neck W and/or Wo Contrast  Result Date: 01/22/2021 CLINICAL DATA:  Neuro deficit, acute, stroke suspected. Additional provided: Speech difficulty, residual right-sided weakness from prior  stroke. EXAM: CT ANGIOGRAPHY HEAD AND NECK TECHNIQUE: Multidetector CT imaging of the head and neck was performed using the standard protocol during bolus administration of intravenous contrast. Multiplanar CT image reconstructions and MIPs were obtained to evaluate the vascular anatomy. Carotid stenosis measurements (when applicable) are obtained utilizing NASCET criteria, using the distal internal carotid diameter as the denominator. CONTRAST:  158mL OMNIPAQUE IOHEXOL 350 MG/ML SOLN COMPARISON:  Noncontrast head CT examinations 01/22/2021 and earlier. Brain MRI 09/21/2019. CT angiogram head/neck 09/20/2019. FINDINGS: CTA NECK FINDINGS Aortic arch: Standard aortic branching. Atherosclerotic plaque within the visualized aortic arch and proximal major branch vessels of the neck. No hemodynamically significant innominate or proximal subclavian artery stenosis. Right carotid system: CCA and ICA patent within the neck without stenosis. No significant atherosclerotic disease. Tortuosity of both the CCA and ICA. Left carotid system: CCA and ICA patent within the neck without stenosis. No significant atherosclerotic disease. Tortuosity of both the CCA and ICA. Vertebral arteries: Vertebral arteries patent within the neck without hemodynamically significant stenosis. Mild calcified plaque at the origin of the non dominant right vertebral artery. Skeleton: Cervical spondylosis. Reversal of the expected cervical lordosis. No acute bony abnormality or aggressive osseous lesion. Other neck: No neck mass or cervical lymphadenopathy. Upper chest: No consolidation within the imaged lung apices. Review of the MIP images confirms the above findings CTA HEAD FINDINGS Anterior circulation: The intracranial internal carotid arteries are patent. Scattered Nonstenotic calcified plaque within both vessels. The M1 middle cerebral arteries are patent. No M2 proximal branch occlusion or high-grade proximal stenosis is identified. The  anterior cerebral arteries are patent. No intracranial aneurysm is identified. Posterior circulation: The intracranial vertebral arteries are patent. The basilar artery is patent. The posterior cerebral arteries are patent. Hypoplastic left P1 segment with sizable left posterior communicating artery. A small right posterior communicating artery is present. Unchanged atherosclerotic irregularity of both posterior cerebral arteries without high-grade proximal stenosis. Venous sinuses: Within the limitations of contrast timing, no convincing thrombus. Anatomic variants: As described Review of the MIP images confirms the above findings IMPRESSION: CTA neck: 1. The common carotid, internal carotid and vertebral arteries are patent within the neck without hemodynamically significant stenosis. Unchanged mild calcified plaque at the origin of the non dominant right vertebral artery. 2. Aortic Atherosclerosis (ICD10-I70.0). CTA head: 1. No intracranial large vessel occlusion or proximal high-grade arterial stenosis. 2. Mild nonstenotic calcified plaque within the intracranial internal carotid arteries. 3. Unchanged atherosclerotic irregularity of the bilateral posterior cerebral arteries. Electronically Signed   By: Kellie Simmering DO   On: 01/22/2021 13:14   MR BRAIN WO CONTRAST  Result Date: 01/22/2021 CLINICAL DATA:  Stroke, follow-up. EXAM: MRI HEAD WITHOUT CONTRAST TECHNIQUE: Multiplanar, multiecho pulse sequences of the brain and surrounding structures were obtained without intravenous contrast. COMPARISON:  CT angiogram head/neck and non-contrast head CT 01/22/2021. brain MRI 09/21/2019. FINDINGS: Brain: Redemonstrated parietal lobe predominant cerebral atrophy. Mild generalized cerebellar atrophy. There are two acute cortical infarcts within the posterior left frontal lobe measuring up to 10 mm (series 5, image 84) (  series 5, image 78). One of these infarcts involve the motor strip laterally. Redemonstrated chronic  lacunar infarcts within the left corona radiata, right thalamus and bilateral cerebellar hemispheres. Moderate background multifocal T2/FLAIR hyperintensity within the cerebral white matter, nonspecific but compatible chronic small vessel ischemic disease. Redemonstrated chronic microhemorrhage within the right frontoparietal lobes (series 14, image 43). No evidence of intracranial mass. No extra-axial fluid collection. No midline shift. Vascular: Expected proximal arterial flow voids. Skull and upper cervical spine: No focal suspicious marrow lesion. Incompletely assessed cervical spondylosis. Sinuses/Orbits: Bilateral lens replacements. Visualized orbits show no acute finding. No significant paranasal sinus disease. IMPRESSION: Two acute cortical infarcts within the posterior left frontal lobe measuring up to 10 mm, one of which involves the motor strip. Otherwise stable non-contrast MRI appearance of the brain as compared to 09/21/2019. Chronic lacunar infarcts within the left corona radiata, right thalamus and bilateral cerebellar hemispheres. Background moderate cerebral white matter chronic small vessel ischemic disease. Parietal lobe predominant cerebral atrophy. Mild generalized cerebellar atrophy. Electronically Signed   By: Kellie Simmering DO   On: 01/22/2021 19:06    Assessment: 75 y.o. female presenting with acute left frontal lobe strokes 1. Exam reveals stuttering speech that is most likely unrelated to her acute strokes. Mild dysarthria, RUE tremor, left cerebellar drift and hyperreflexia are also noted.  2. MRI brain: Two acute cortical infarcts within the posterior left frontal lobe measuring up to 10 mm, one of which involves the motor strip. Otherwise stable non-contrast MRI appearance of the brain as compared to 09/21/2019. Chronic lacunar infarcts within the left corona radiata, right thalamus and bilateral cerebellar hemispheres. Background moderate cerebral white matter chronic small vessel  ischemic disease. Parietal lobe predominant cerebral atrophy. Mild generalized cerebellar atrophy. 3. CTA neck: The common carotid, internal carotid and vertebral arteries are patent within the neck without hemodynamically significant stenosis. Unchanged mild calcified plaque at the origin of the non dominant right vertebral artery. Aortic Atherosclerosis  4. CTA head: No intracranial large vessel occlusion or proximal high-grade arterial stenosis. Mild nonstenotic calcified plaque within the intracranial internal carotid arteries. Unchanged atherosclerotic irregularity of the bilateral posterior cerebral arteries. 5. Stroke Risk Factors - HTN, PVD and prior stroke  Recommendations: 1. HgbA1c, fasting lipid panel 2. TTE 3. PT consult, OT consult, Speech consult 4. Cardiac telemetry 5. Restart Plavix and add ASA 6. Atorvastatin 40 mg po qd. Obtain baseline CK level 7. Risk factor modification 8. Frequent neuro checks 9. BP management. Out of permissive HTN time window.    @Electronically  signed: Dr. Kerney Elbe  01/22/2021, 8:10 PM

## 2021-01-22 NOTE — ED Notes (Signed)
Pt transported to [facility] with Barnett Applebaum, RN

## 2021-01-23 ENCOUNTER — Inpatient Hospital Stay (HOSPITAL_COMMUNITY): Payer: Medicare Other

## 2021-01-23 DIAGNOSIS — I63412 Cerebral infarction due to embolism of left middle cerebral artery: Secondary | ICD-10-CM

## 2021-01-23 DIAGNOSIS — E782 Mixed hyperlipidemia: Secondary | ICD-10-CM

## 2021-01-23 DIAGNOSIS — I6389 Other cerebral infarction: Secondary | ICD-10-CM

## 2021-01-23 DIAGNOSIS — I639 Cerebral infarction, unspecified: Secondary | ICD-10-CM

## 2021-01-23 DIAGNOSIS — R4701 Aphasia: Secondary | ICD-10-CM

## 2021-01-23 LAB — ECHOCARDIOGRAM COMPLETE
Area-P 1/2: 4.71 cm2
Height: 67 in
MV M vel: 5.29 m/s
MV Peak grad: 111.9 mmHg
P 1/2 time: 388 msec
S' Lateral: 2.4 cm
Weight: 2977.09 oz

## 2021-01-23 LAB — LIPID PANEL
Cholesterol: 156 mg/dL (ref 0–200)
HDL: 55 mg/dL (ref >40)
LDL Cholesterol: 83 mg/dL (ref 0–99)
Total CHOL/HDL Ratio: 2.8 RATIO
Triglycerides: 91 mg/dL (ref <150)
VLDL: 18 mg/dL (ref 0–40)

## 2021-01-23 LAB — HEMOGLOBIN A1C
Hgb A1c MFr Bld: 5.3 % (ref 4.8–5.6)
Mean Plasma Glucose: 105 mg/dL

## 2021-01-23 MED ORDER — LISINOPRIL 10 MG PO TABS
10.0000 mg | ORAL_TABLET | Freq: Every day | ORAL | Status: DC
Start: 2021-01-23 — End: 2021-01-25
  Administered 2021-01-23 – 2021-01-24 (×2): 10 mg via ORAL
  Filled 2021-01-23 (×2): qty 1

## 2021-01-23 MED ORDER — ASPIRIN 300 MG RE SUPP
300.0000 mg | Freq: Once | RECTAL | Status: DC
  Filled 2021-01-23: qty 1

## 2021-01-23 MED ORDER — ASPIRIN 325 MG PO TABS
ORAL_TABLET | ORAL | Status: AC
  Filled 2021-01-23: qty 1

## 2021-01-23 MED ORDER — ASPIRIN EC 81 MG PO TBEC
81.0000 mg | DELAYED_RELEASE_TABLET | Freq: Every day | ORAL | Status: DC
  Administered 2021-01-23 – 2021-01-24 (×2): 81 mg via ORAL
  Filled 2021-01-23 (×2): qty 1

## 2021-01-23 MED ORDER — ROSUVASTATIN CALCIUM 5 MG PO TABS
10.0000 mg | ORAL_TABLET | Freq: Every day | ORAL | Status: DC
  Administered 2021-01-23 – 2021-01-24 (×2): 10 mg via ORAL
  Filled 2021-01-23 (×2): qty 2

## 2021-01-23 MED ORDER — ROSUVASTATIN CALCIUM 5 MG PO TABS: 5.0000 mg | ORAL_TABLET | Freq: Every day | ORAL | Status: DC

## 2021-01-23 MED ORDER — SODIUM CHLORIDE 0.9 % IV SOLN: INTRAVENOUS | Status: DC

## 2021-01-23 MED ORDER — CLOPIDOGREL BISULFATE 75 MG PO TABS
75.0000 mg | ORAL_TABLET | Freq: Every day | ORAL | Status: DC
  Administered 2021-01-23 – 2021-01-24 (×2): 75 mg via ORAL
  Filled 2021-01-23 (×2): qty 1

## 2021-01-23 NOTE — Progress Notes (Signed)
RN paged resident intern, reported that overnight pt stated she was not DNR, and refused to wear DNR band, no DNR form in physical chart. Resident intern stated she would follow up with MD.

## 2021-01-23 NOTE — Evaluation (Signed)
Physical Therapy Evaluation Patient Details Name: Katherine Carrillo MRN: [id number] DOB: 1946-04-24 Today's Date: 01/23/2021   History of Present Illness  Katherine Carrillo is a 75 y.o. female presenting with stroke-like symptoms. PMH is significant for CVA, HTN, and HLD.  MRI with 2 acute cortical infarcts in the posterior left frontal lobe, chronic lacunar infarcts present.  Clinical Impression  Patient received in recliner. Just worked with OT. Patient has expressive difficulties and reports fatigue. She requires min guard for sit to stand and for ambulation of 50 feet. Patient limited by fatigue with gait. She will continue to benefit from skilled PT while here to improve strength and functional independence.       Follow Up Recommendations Home health PT    Equipment Recommendations  Other (comment) (if she does not have one)    Recommendations for Other Services       Precautions / Restrictions Precautions Precautions: Fall Restrictions Weight Bearing Restrictions: No      Mobility  Bed Mobility               General bed mobility comments: patient in recliner    Transfers Overall transfer level: Needs assistance Equipment used: None Transfers: Sit to/from Stand Sit to Stand: Min guard            Ambulation/Gait Ambulation/Gait assistance: Min assist;Min guard Gait Distance (Feet): 50 Feet Assistive device: Rolling walker (2 wheeled);None Gait Pattern/deviations: Step-through pattern;Decreased step length - right;Decreased step length - left Gait velocity: decr   General Gait Details: Patient ambulated 10 feet without AD, using furniture in room for stability. Then ambulated the rest of way with RW.  Patient main report is fatigue and unsteadiness.  Stairs            Wheelchair Mobility    Modified Rankin (Stroke Patients Only)       Balance Overall balance assessment: Needs assistance Sitting-balance support: Feet supported Sitting balance-Leahy  Scale: Good     Standing balance support: Single extremity supported;Bilateral upper extremity supported;During functional activity Standing balance-Leahy Scale: Fair Standing balance comment: reliant on at least single UE support                             Pertinent Vitals/Pain Pain Assessment: No/denies pain    Home Living Family/patient expects to be discharged to:: Private residence Living Arrangements: Children Available Help at Discharge: Family;Available 24 hours/day Type of Home: House Home Access: Stairs to enter     Home Layout: Able to live on main level with bedroom/bathroom;Two level Home Equipment: Walker - 2 wheels Additional Comments: Per pt, daughter works night shift. Yolanda Bonine is there at night    Prior Function Level of Independence: Independent with assistive device(s)         Comments: Per prior admission, patient was using cane for mobility. She states she has walker if needed.     Hand Dominance   Dominant Hand: Right    Extremity/Trunk Assessment   Upper Extremity Assessment Upper Extremity Assessment: Defer to OT evaluation    Lower Extremity Assessment Lower Extremity Assessment: Generalized weakness       Communication   Communication: Expressive difficulties  Cognition Arousal/Alertness: Awake/alert Behavior During Therapy: WFL for tasks assessed/performed Overall Cognitive Status: Within Functional Limits for tasks assessed  General Comments General comments (skin integrity, edema, etc.): HR 98    Exercises     Assessment/Plan    PT Assessment Patient needs continued PT services  PT Problem List Decreased strength;Decreased mobility;Decreased activity tolerance;Decreased balance       PT Treatment Interventions DME instruction;Therapeutic exercise;Gait training;Balance training;Stair training;Functional mobility training;Therapeutic  activities;Patient/family education    PT Goals (Current goals can be found in the Care Plan section)  Acute Rehab PT Goals Patient Stated Goal: none stated Time For Goal Achievement: 02/06/21    Frequency Min 4X/week   Barriers to discharge Decreased caregiver support      Co-evaluation               AM-PAC PT "6 Clicks" Mobility  Outcome Measure Help needed turning from your back to your side while in a flat bed without using bedrails?: A Little Help needed moving from lying on your back to sitting on the side of a flat bed without using bedrails?: A Little Help needed moving to and from a bed to a chair (including a wheelchair)?: A Little Help needed standing up from a chair using your arms (e.g., wheelchair or bedside chair)?: A Little Help needed to walk in hospital room?: A Little Help needed climbing 3-5 steps with a railing? : A Lot 6 Click Score: 17    End of Session Equipment Utilized During Treatment: Gait belt Activity Tolerance: Patient limited by fatigue Patient left: in chair;with call bell/phone within reach;with chair alarm set Nurse Communication: Mobility status PT Visit Diagnosis: Unsteadiness on feet (R26.81);Muscle weakness (generalized) (M62.81);Difficulty in walking, not elsewhere classified (R26.2)    Time: 1005-1016 PT Time Calculation (min) (ACUTE ONLY): 11 min   Charges:   PT Evaluation $PT Eval Moderate Complexity: 1 Mod          Willmar Stockinger, PT, GCS 01/23/21,11:10 AM

## 2021-01-23 NOTE — Progress Notes (Signed)
  Echocardiogram 2D Echocardiogram has been performed.  Katherine Carrillo 01/23/2021, 2:17 PM

## 2021-01-23 NOTE — Progress Notes (Signed)
Family Medicine Teaching Service Daily Progress Note Intern Pager: [phone]  Patient name: Katherine Carrillo Medical record number: [id number] Date of birth: [birth year] Age: 75 y.o. Gender: female  Primary Care Provider: Curt Jews, PA-C Consultants: Neurology  Code Status: DNR  Pt Overview and Major Events to Date:  6/2 - Admitted  Assessment and Plan: Katherine Carrillo is a 75 y.o. female presenting with stroke-like symptoms. PMH is significant for CVA, HTN, and HLD.  Stroke MRI with 2 acute cortical infarcts in the posterior left frontal lobe, chronic lacunar infarcts present. UDS negative, alcohol level <10, UA unremarkable, PT/INR 13.4/1.0, CBC overall wnl.  - Neurology following, appreciated recommendations - Anticoagulation will be Plavix and ASA once cleared by speech therapy - follow up ECHO - Cardiac Telemetry monitroing - PT/OT/SLP Eval and treat    - Vital signs q4hr - Neuro checks q4hr - F/u a1c - F/u echoardiogram - NPO - Rectal ASA  HTN BP range 125-201/63-107, most recent BP 145/65. S/p 1 dose of IV Hydral 20mg  when BP was significantly elevated. Home medications of Coreg 12.5 BID and Lisinopril daily. -Consider restarting home lisinopril and Coreg when cleared by speech  HLD LDL 83, currently not taking statin.  -Atorvastatin 40 mg daily once cleared by speech therapy -Obtain baseline CK level per neurology recommendations  FEN/GI: NS @100mL /h, NPO PPx: SCDs   Status is: Inpatient  Remains inpatient appropriate because:Ongoing diagnostic testing needed not appropriate for outpatient work up   Dispo: The patient is from: Home              Anticipated d/c is to: Home              Patient currently is not medically stable to d/c.   Difficult to place patient No    Subjective:  Patient reports that for the last 3 days she has been having some difficulty with getting her words out, she states that she has some in her head but is struggling to vocalize  them.  She has been having headaches for the last couple days as well, but currently does not have one.  She does not feel that she has any weaker than she previously was, at home walks with a cane.   Objective: Temp:  [98.3 F (36.8 C)-99.8 F (37.7 C)] 99.8 F (37.7 C) (06/03 0340) Pulse Rate:  [67-106] 83 (06/03 0340) Resp:  [13-30] 18 (06/03 0340) BP: (125-201)/(63-107) 125/63 (06/03 0340) SpO2:  [98 %-100 %] 99 % (06/03 0340) Weight:  [84.4 kg-90 kg] 84.4 kg (06/02 2223) Physical Exam: General --  pleasant and cooperative. HEENT -- NCAT Neck -- supple; no bruits. Chest -- good expansion. Lungs clear to auscultation. Cardiac -- RRR. No murmurs noted.  Abdomen -- soft, nontender. No masses palpable. Bowel sounds present. Neuro: stuttering speech with some dysarthria, patient able to follow all commands, some concern for expressive aphasia during interview CN II: PERRL CN III, IV,VI: EOMI CV V: Normal sensation in V1, V2, V3 CVII: Symmetric smile and brow raise CN VIII: Normal hearing CN IX,X: Symmetric palate raise  CN XI: 5/5 shoulder shrug CN XII: Symmetric tongue protrusion  UE and LE strength 5/5 though decreased grip strength in right hand of 4/5 Normal sensation in UE and LE bilaterally  Normal finger to nose with left hand left, slower movements appreciated on the right, unable to complete heel to shin with SCDs in place    Laboratory: Recent Labs  Lab 01/22/21 1103  WBC 5.1  HGB 11.2*  HCT 35.1*  PLT 159   Recent Labs  Lab 01/22/21 1103  NA 137  K 5.1  CL 104  CO2 25  BUN 17  CREATININE 1.02*  CALCIUM 9.2  PROT 7.5  BILITOT 0.8  ALKPHOS 52  ALT 12  AST 20  GLUCOSE 100*     Imaging/Diagnostic Tests: CT Angio Head W or Wo Contrast  Result Date: 01/22/2021 CLINICAL DATA:  Neuro deficit, acute, stroke suspected. Additional provided: Speech difficulty, residual right-sided weakness from prior stroke. EXAM: CT ANGIOGRAPHY HEAD AND NECK TECHNIQUE:  Multidetector CT imaging of the head and neck was performed using the standard protocol during bolus administration of intravenous contrast. Multiplanar CT image reconstructions and MIPs were obtained to evaluate the vascular anatomy. Carotid stenosis measurements (when applicable) are obtained utilizing NASCET criteria, using the distal internal carotid diameter as the denominator. CONTRAST:  166mL OMNIPAQUE IOHEXOL 350 MG/ML SOLN COMPARISON:  Noncontrast head CT examinations 01/22/2021 and earlier. Brain MRI 09/21/2019. CT angiogram head/neck 09/20/2019. FINDINGS: CTA NECK FINDINGS Aortic arch: Standard aortic branching. Atherosclerotic plaque within the visualized aortic arch and proximal major branch vessels of the neck. No hemodynamically significant innominate or proximal subclavian artery stenosis. Right carotid system: CCA and ICA patent within the neck without stenosis. No significant atherosclerotic disease. Tortuosity of both the CCA and ICA. Left carotid system: CCA and ICA patent within the neck without stenosis. No significant atherosclerotic disease. Tortuosity of both the CCA and ICA. Vertebral arteries: Vertebral arteries patent within the neck without hemodynamically significant stenosis. Mild calcified plaque at the origin of the non dominant right vertebral artery. Skeleton: Cervical spondylosis. Reversal of the expected cervical lordosis. No acute bony abnormality or aggressive osseous lesion. Other neck: No neck mass or cervical lymphadenopathy. Upper chest: No consolidation within the imaged lung apices. Review of the MIP images confirms the above findings CTA HEAD FINDINGS Anterior circulation: The intracranial internal carotid arteries are patent. Scattered Nonstenotic calcified plaque within both vessels. The M1 middle cerebral arteries are patent. No M2 proximal branch occlusion or high-grade proximal stenosis is identified. The anterior cerebral arteries are patent. No intracranial  aneurysm is identified. Posterior circulation: The intracranial vertebral arteries are patent. The basilar artery is patent. The posterior cerebral arteries are patent. Hypoplastic left P1 segment with sizable left posterior communicating artery. A small right posterior communicating artery is present. Unchanged atherosclerotic irregularity of both posterior cerebral arteries without high-grade proximal stenosis. Venous sinuses: Within the limitations of contrast timing, no convincing thrombus. Anatomic variants: As described Review of the MIP images confirms the above findings IMPRESSION: CTA neck: 1. The common carotid, internal carotid and vertebral arteries are patent within the neck without hemodynamically significant stenosis. Unchanged mild calcified plaque at the origin of the non dominant right vertebral artery. 2. Aortic Atherosclerosis (ICD10-I70.0). CTA head: 1. No intracranial large vessel occlusion or proximal high-grade arterial stenosis. 2. Mild nonstenotic calcified plaque within the intracranial internal carotid arteries. 3. Unchanged atherosclerotic irregularity of the bilateral posterior cerebral arteries. Electronically Signed   By: Kellie Simmering DO   On: 01/22/2021 13:14   CT HEAD WO CONTRAST  Result Date: 01/22/2021 CLINICAL DATA:  75 year old female with difficulty speaking, aphasia since yesterday morning. History of prior strokes, including left M2/M3 occlusion in January treated with IV tPA. EXAM: CT HEAD WITHOUT CONTRAST TECHNIQUE: Contiguous axial images were obtained from the base of the skull through the vertex without intravenous contrast. COMPARISON:  Brain MRI 09/21/2019 and earlier. FINDINGS: Brain: Stable cerebral  volume since January. No midline shift, mass effect, or evidence of intracranial mass lesion. No ventriculomegaly. No acute intracranial hemorrhage identified. Stable appearance of chronic small vessel disease in the deep gray matter nuclei and bilateral cerebellum.  Stable chronic parietal lobe volume loss. No cortically based acute infarct identified. No suspicious intracranial vascular hyperdensity. Vascular: Calcified atherosclerosis at the skull base. Skull: Stable hyperostosis, normal variant. No acute osseous abnormality identified. Sinuses/Orbits: Visualized paranasal sinuses and mastoids are stable and well aerated. Other: No acute orbit or scalp soft tissue finding. IMPRESSION: 1. No acute cortically based infarct or acute intracranial hemorrhage identified. 2. Stable non contrast CT appearance of chronic small vessel ischemia since January. Electronically Signed   By: Genevie Ann M.D.   On: 01/22/2021 11:27   CT Angio Neck W and/or Wo Contrast  Result Date: 01/22/2021 CLINICAL DATA:  Neuro deficit, acute, stroke suspected. Additional provided: Speech difficulty, residual right-sided weakness from prior stroke. EXAM: CT ANGIOGRAPHY HEAD AND NECK TECHNIQUE: Multidetector CT imaging of the head and neck was performed using the standard protocol during bolus administration of intravenous contrast. Multiplanar CT image reconstructions and MIPs were obtained to evaluate the vascular anatomy. Carotid stenosis measurements (when applicable) are obtained utilizing NASCET criteria, using the distal internal carotid diameter as the denominator. CONTRAST:  144mL OMNIPAQUE IOHEXOL 350 MG/ML SOLN COMPARISON:  Noncontrast head CT examinations 01/22/2021 and earlier. Brain MRI 09/21/2019. CT angiogram head/neck 09/20/2019. FINDINGS: CTA NECK FINDINGS Aortic arch: Standard aortic branching. Atherosclerotic plaque within the visualized aortic arch and proximal major branch vessels of the neck. No hemodynamically significant innominate or proximal subclavian artery stenosis. Right carotid system: CCA and ICA patent within the neck without stenosis. No significant atherosclerotic disease. Tortuosity of both the CCA and ICA. Left carotid system: CCA and ICA patent within the neck without  stenosis. No significant atherosclerotic disease. Tortuosity of both the CCA and ICA. Vertebral arteries: Vertebral arteries patent within the neck without hemodynamically significant stenosis. Mild calcified plaque at the origin of the non dominant right vertebral artery. Skeleton: Cervical spondylosis. Reversal of the expected cervical lordosis. No acute bony abnormality or aggressive osseous lesion. Other neck: No neck mass or cervical lymphadenopathy. Upper chest: No consolidation within the imaged lung apices. Review of the MIP images confirms the above findings CTA HEAD FINDINGS Anterior circulation: The intracranial internal carotid arteries are patent. Scattered Nonstenotic calcified plaque within both vessels. The M1 middle cerebral arteries are patent. No M2 proximal branch occlusion or high-grade proximal stenosis is identified. The anterior cerebral arteries are patent. No intracranial aneurysm is identified. Posterior circulation: The intracranial vertebral arteries are patent. The basilar artery is patent. The posterior cerebral arteries are patent. Hypoplastic left P1 segment with sizable left posterior communicating artery. A small right posterior communicating artery is present. Unchanged atherosclerotic irregularity of both posterior cerebral arteries without high-grade proximal stenosis. Venous sinuses: Within the limitations of contrast timing, no convincing thrombus. Anatomic variants: As described Review of the MIP images confirms the above findings IMPRESSION: CTA neck: 1. The common carotid, internal carotid and vertebral arteries are patent within the neck without hemodynamically significant stenosis. Unchanged mild calcified plaque at the origin of the non dominant right vertebral artery. 2. Aortic Atherosclerosis (ICD10-I70.0). CTA head: 1. No intracranial large vessel occlusion or proximal high-grade arterial stenosis. 2. Mild nonstenotic calcified plaque within the intracranial internal  carotid arteries. 3. Unchanged atherosclerotic irregularity of the bilateral posterior cerebral arteries. Electronically Signed   By: Kellie Simmering DO   On:  01/22/2021 13:14   MR BRAIN WO CONTRAST  Result Date: 01/22/2021 CLINICAL DATA:  Stroke, follow-up. EXAM: MRI HEAD WITHOUT CONTRAST TECHNIQUE: Multiplanar, multiecho pulse sequences of the brain and surrounding structures were obtained without intravenous contrast. COMPARISON:  CT angiogram head/neck and non-contrast head CT 01/22/2021. brain MRI 09/21/2019. FINDINGS: Brain: Redemonstrated parietal lobe predominant cerebral atrophy. Mild generalized cerebellar atrophy. There are two acute cortical infarcts within the posterior left frontal lobe measuring up to 10 mm (series 5, image 84) (series 5, image 78). One of these infarcts involve the motor strip laterally. Redemonstrated chronic lacunar infarcts within the left corona radiata, right thalamus and bilateral cerebellar hemispheres. Moderate background multifocal T2/FLAIR hyperintensity within the cerebral white matter, nonspecific but compatible chronic small vessel ischemic disease. Redemonstrated chronic microhemorrhage within the right frontoparietal lobes (series 14, image 43). No evidence of intracranial mass. No extra-axial fluid collection. No midline shift. Vascular: Expected proximal arterial flow voids. Skull and upper cervical spine: No focal suspicious marrow lesion. Incompletely assessed cervical spondylosis. Sinuses/Orbits: Bilateral lens replacements. Visualized orbits show no acute finding. No significant paranasal sinus disease. IMPRESSION: Two acute cortical infarcts within the posterior left frontal lobe measuring up to 10 mm, one of which involves the motor strip. Otherwise stable non-contrast MRI appearance of the brain as compared to 09/21/2019. Chronic lacunar infarcts within the left corona radiata, right thalamus and bilateral cerebellar hemispheres. Background moderate cerebral  white matter chronic small vessel ischemic disease. Parietal lobe predominant cerebral atrophy. Mild generalized cerebellar atrophy. Electronically Signed   By: Kellie Simmering DO   On: 01/22/2021 19:06     Rise Patience, DO 01/23/2021, 6:39 AM PGY-1, [place] Intern pager: [phone], text pages welcome

## 2021-01-23 NOTE — Evaluation (Signed)
Occupational Therapy Evaluation Patient Details Name: Katherine Carrillo MRN: [id number] DOB: [birth year] Today's Date: 01/23/2021    History of Present Illness Katherine Carrillo is a 75 y.o. female presenting with difficulty speaking and slurring of words. MRI with 2 acute cortical infarcts in the posterior left frontal lobe, chronic lacunar infarcts present. PMH is significant for CVA, HTN, and HLD.   Clinical Impression   PTA, pt was living with her daughter and reports being independent with the use of a cane for mobility. Daughter assisted with IADLs. Currently, pt performs LB ADLs and functional mobility with min guard A. Pt requiring min verbal cues for sequencing, problem solving, and locating items in her environment. Pt presents with decreased strength, balance, attention, safety, awareness, problem solving, and short term memory. Recommend HHOT and will continue to follow acutely to improve independence in ADLs.     Follow Up Recommendations  Home health OT    Equipment Recommendations  3 in 1 bedside commode    Recommendations for Other Services       Precautions / Restrictions Precautions Precautions: Fall Restrictions Weight Bearing Restrictions: No      Mobility Bed Mobility Overal bed mobility: Needs Assistance Bed Mobility: Supine to Sit     Supine to sit: Supervision     General bed mobility comments: Pt required min cues to bring scoot hips forward achieve base of support with feet on the floor.    Transfers Overall transfer level: Needs assistance Equipment used: None Transfers: Sit to/from Stand Sit to Stand: Min guard         General transfer comment: Pt transferred with min guard for safety.    Balance Overall balance assessment: Needs assistance Sitting-balance support: Feet supported Sitting balance-Leahy Scale: Good     Standing balance support: During functional activity;Single extremity supported;No upper extremity supported Standing  balance-Leahy Scale: Fair Standing balance comment: Pt benefitted from single UE support from therapist, but preference for supporting self on objects in room.                           ADL either performed or assessed with clinical judgement   ADL Overall ADL's : Needs assistance/impaired Eating/Feeding: Set up;Sitting   Grooming: Wash/dry hands;Min guard;Cueing for sequencing;Standing Grooming Details (indicate cue type and reason): Pt required min guard for safety, and min verbal cues to sequence task and locate soap dispenser. Upper Body Bathing: Supervision/ safety;Sitting;Cueing for sequencing   Lower Body Bathing: Min guard;Cueing for sequencing;Sit to/from stand   Upper Body Dressing : Supervision/safety;Sitting   Lower Body Dressing: Min guard;Sit to/from stand;Cueing for sequencing   Toilet Transfer: Min guard;Ambulation;Regular Glass blower/designer Details (indicate cue type and reason): Pt required Min guard for safety. Toileting- Clothing Manipulation and Hygiene: Supervision/safety;Sitting/lateral lean       Functional mobility during ADLs: Cueing for safety;Min guard General ADL Comments: Pt required min cues to for awareness of environmentas indicated by requiring cues to locate toilet paper, the sink, and soap dispenser. Pt required min verbal cues to sequence tasks and a reminder to get soap to perform hand hygiene. Pt preferred to support herself on objects in room versus therapist's hand during functional mobility.     Vision Baseline Vision/History: Wears glasses Wears Glasses: Reading only Patient Visual Report: No change from baseline (Pt reports no change) Additional Comments: Not tested     Perception Perception Perception Tested?: No Spatial deficits: Pt appears to be inattentive to the R side  of her environment requiring cues to locate items on the right.   Praxis Praxis Praxis tested?: Not tested Praxis-Other Comments: No apparent  motor planning difficulties.    Pertinent Vitals/Pain Pain Assessment: Faces Faces Pain Scale: No hurt Pain Intervention(s): Monitored during session     Hand Dominance Right   Extremity/Trunk Assessment Upper Extremity Assessment Upper Extremity Assessment: Generalized weakness (4/5 grip strength, 4+/5 bilateral elbow flexion)   Lower Extremity Assessment Lower Extremity Assessment: Generalized weakness       Communication Communication Communication: Expressive difficulties   Cognition Arousal/Alertness: Awake/alert Behavior During Therapy: WFL for tasks assessed/performed Overall Cognitive Status: Impaired/Different from baseline Area of Impairment: Memory;Safety/judgement;Awareness;Problem solving;Following commands;Attention                   Current Attention Level: Sustained Memory: Decreased short-term memory Following Commands: Follows one step commands with increased time Safety/Judgement: Decreased awareness of safety;Decreased awareness of deficits Awareness: Intellectual Problem Solving: Difficulty sequencing;Requires verbal cues General Comments: Pt presents with decreased awareness. Pt with difficulty probem solving and sequencing requiring min cues to find toilet paper, sink and soap dispenser during session.   General Comments  Pt reports she lives with her daughter who works night shift. Daughter available during the day, and son available at night. HR 98    Exercises     Shoulder Instructions      Home Living Family/patient expects to be discharged to:: Private residence Living Arrangements: Children Available Help at Discharge: Family;Available 24 hours/day Type of Home: House Home Access: Stairs to enter [employer] of Steps: 3 Entrance Stairs-Rails: Left Home Layout: Able to live on main level with bedroom/bathroom;Two level Alternate Level Stairs-Number of Steps: 4 Alternate Level Stairs-Rails: Left Bathroom Shower/Tub:  Tub/shower unit   Bathroom Toilet: Standard     Home Equipment: Environmental consultant - 2 wheels;Cane - single point   Additional Comments: Per pt, daughter works night shift. Yolanda Bonine is there at night      Prior Functioning/Environment Level of Independence: Independent with assistive device(s)        Comments: Pt reports she uses a walker at home but has a cane if needed.        OT Problem List: Decreased strength;Decreased activity tolerance;Impaired balance (sitting and/or standing);Decreased cognition;Decreased safety awareness      OT Treatment/Interventions: Self-care/ADL training;Therapeutic exercise;Therapeutic activities;Cognitive remediation/compensation;Patient/family education    OT Goals(Current goals can be found in the care plan section) Acute Rehab OT Goals Patient Stated Goal: none stated OT Goal Formulation: With patient Time For Goal Achievement: 02/06/21 Potential to Achieve Goals: Good ADL Goals Pt Will Perform Grooming: with modified independence;standing (with no verbal cues to sequence task) Pt Will Perform Lower Body Dressing: with supervision;sit to/from stand Pt Will Perform Tub/Shower Transfer: Tub transfer;3 in 1;with min guard assist Additional ADL Goal #1: Pt will demonstrate awareness of environment by completing 3 step path finding task with supervision.  OT Frequency: Min 2X/week   Barriers to D/C:            Co-evaluation              AM-PAC OT "6 Clicks" Daily Activity     Outcome Measure Help from another person eating meals?: A Little Help from another person taking care of personal grooming?: A Little Help from another person toileting, which includes using toliet, bedpan, or urinal?: A Little Help from another person bathing (including washing, rinsing, drying)?: A Little Help from another person to put on and taking off regular  upper body clothing?: A Little Help from another person to put on and taking off regular lower body  clothing?: A Little 6 Click Score: 18   End of Session Equipment Utilized During Treatment: Gait belt Nurse Communication: Mobility status  Activity Tolerance: Patient tolerated treatment well Patient left: in chair;with call bell/phone within reach;Other (comment) (with PT present)  OT Visit Diagnosis: Unsteadiness on feet (R26.81);Muscle weakness (generalized) (M62.81);Other symptoms and signs involving cognitive function                Time: 0943-1006 OT Time Calculation (min): 23 min Charges:  OT General Charges $OT Visit: 1 Visit OT Evaluation $OT Eval Moderate Complexity: 1 Mod OT Treatments $Self Care/Home Management : 8-22 mins  Shanda Howells, OTDS  Shanda Howells 01/23/2021, 1:17 PM

## 2021-01-23 NOTE — Evaluation (Signed)
Speech Language Pathology Evaluation Patient Details Name: Katherine Carrillo MRN: [id number] DOB: 08-06-1946 Today's Date: 01/23/2021 Time: 0910-1000 SLP Time Calculation (min) (ACUTE ONLY): 50 min  Problem List:  Patient Active Problem List   Diagnosis Date Noted  . Aphasia   . Stroke ([place]) 09/20/2019  . Mixed hyperlipidemia   . Aortic atherosclerosis ([place]) 03/26/2018  . History of transient ischemic attack (TIA) 12/22/2016  . Dysarthria 12/22/2016  . Essential hypertension 12/22/2016  . History of stroke 12/22/2016  . PVD (peripheral vascular disease) ([place]) 12/22/2016   Past Medical History:  Past Medical History:  Diagnosis Date  . Anemia   . Cervical cancer ([place])   . Hypertension   . Peripheral vascular disease ([place])   . Stroke [facility])    Past Surgical History:  Past Surgical History:  Procedure Laterality Date  . ABDOMINAL HYSTERECTOMY    . CESAREAN SECTION    . FEMORAL ARTERY - FEMORAL ARTERY BYPASS GRAFT    . LEFT HEART CATH AND CORONARY ANGIOGRAPHY N/A 03/27/2018   Procedure: LEFT HEART CATH AND CORONARY ANGIOGRAPHY;  Surgeon: Lorretta Harp, MD;  Location: [facility];  Service: Cardiovascular;  Laterality: N/A;  . TONSILLECTOMY     HPI:  Katherine Carrillo is a 75 y.o. female presenting with stroke-like symptoms. PMH is significant for CVA, HTN, and HLD. MRI shows two acute cortical infarcts within the posterior left frontal lobe, measuring up to 10 mm, one of which involves the motor strip.   Assessment / Plan / Recommendation Clinical Impression  Pt demosntrates a moderate dysarthria witdue to labial and lingual weakness. Pt does have correct articulaltry placement, but tension of tongue is very poor, so intelligibility is impacted. Pt also demonstrates aphasia with ability to follow 1 and 2 step commands, but inability to follow complex commands with prepositions and increased linguistic complexity. Pt able to name fairly familiar objects, but not more unfamiliar  items or pictures. Her spontaneous language is not fluent, generally just one or two words without language structure, though she can repeat and read short phrases. Vision appears to be a complicating factor with reading. Recommend f/u at CIR. Will follow acutely.    SLP Assessment  SLP Recommendation/Assessment: Patient needs continued Speech Lanaguage Pathology Services SLP Visit Diagnosis: Aphasia (R47.01)    Follow Up Recommendations  Inpatient Rehab    Frequency and Duration min 2x/week  2 weeks      SLP Evaluation Cognition  Overall Cognitive Status: Within Functional Limits for tasks assessed Arousal/Alertness: Awake/alert Orientation Level: Oriented to person;Oriented to place;Oriented to situation Attention: Focused;Sustained;Selective Focused Attention: Appears intact Sustained Attention: Appears intact Selective Attention: Appears intact Memory: Appears intact Awareness: Appears intact Problem Solving: Appears intact       Comprehension  Auditory Comprehension Overall Auditory Comprehension: Impaired Yes/No Questions: Impaired Basic Biographical Questions: 76-100% accurate Basic Immediate Environment Questions: 75-100% accurate Complex Questions: 25-49% accurate Commands: Impaired One Step Basic Commands: 75-100% accurate Two Step Basic Commands: 75-100% accurate Complex Commands: 0-24% accurate Reading Comprehension Reading Status: Impaired Word level: Within functional limits Sentence Level: Impaired Paragraph Level: Impaired    Expression Verbal Expression Overall Verbal Expression: Impaired Initiation: No impairment Automatic Speech: Name;Social Response Level of Generative/Spontaneous Verbalization: Phrase;Word Repetition: No impairment Naming: Impairment Responsive: Not tested Confrontation: Impaired Convergent: Not tested Divergent: Not tested Verbal Errors: Aware of errors Pragmatics: No impairment Written Expression Dominant Hand: Right    Oral / Motor  Oral Motor/Sensory Function Overall Oral Motor/Sensory Function: Moderate impairment Facial ROM: Reduced  right;Suspected CN VII (facial) dysfunction Facial Symmetry: Abnormal symmetry right;Suspected CN VII (facial) dysfunction Facial Strength: Reduced right;Suspected CN VII (facial) dysfunction Facial Sensation: Within Functional Limits Lingual ROM: Within Functional Limits Lingual Symmetry: Within Functional Limits Lingual Strength: Reduced;Suspected CN XII (hypoglossal) dysfunction Mandible: Within Functional Limits Motor Speech Overall Motor Speech: Impaired Respiration: Within functional limits Phonation: Normal Resonance: Hypernasality Articulation: Impaired Level of Impairment: Word Intelligibility: Intelligibility reduced Word: 25-49% accurate Phrase: 25-49% accurate Sentence: Not tested Conversation: Not tested Motor Planning: Witnin functional limits Motor Speech Errors: Aware;Consistent   GO                   Herbie Baltimore, MA [place] Pager [phone] Office [phone]  [name] 01/23/2021, 10:41 AM

## 2021-01-23 NOTE — TOC Initial Note (Signed)
Transition of Care Katherine Carrillo) - Initial/Assessment Note    Patient Details  Name: Katherine Carrillo MRN: [id number] Date of Birth: 1946-05-31  Transition of Care Katherine Carrillo) CM/SW Contact:    Katherine Ochs, LCSW Phone Number: 01/23/2021, 1:51 PM  Clinical Narrative:     CSW met with patient to discuss home health and DME needs. Patient indicated she's had home health before but couldn't remember the name of the company, that her daughter might know. Patient has walker and 3N1 at the home already, and no other needs identified. CSW contacted patient's daughter to ask about prior home health company, and Katherine Carrillo said that she believes it was [place] who worked with her before. CSW sent referral to Katherine Carrillo and they will follow for home health when discharged. No other needs identified at this time.               Expected Discharge Plan: Katherine Carrillo Barriers to Discharge: Continued Medical Work up   Patient Goals and CMS Choice Patient states their goals for this hospitalization and ongoing recovery are:: get back home CMS Medicare.gov Compare Post Acute Care list provided to:: Patient Choice offered to / list presented to : Patient  Expected Discharge Plan and Services Expected Discharge Plan: [place] Choice: [place] arrangements for the past 2 months: Single Family Home                           HH Arranged: PT,OT,Speech Therapy HH Agency: Katherine Carrillo Date Katherine Carrillo Agency Contacted: 01/23/21   Representative spoke with at [place]: Katherine Carrillo  Prior Living Arrangements/Services Living arrangements for the past 2 months: [place] Lives with:: Adult Children Patient language and need for interpreter reviewed:: No Do you feel safe going back to the place where you live?: Yes      Need for Family Participation in Patient Care: No (Comment) Care giver support system in place?: Yes (comment) Current home services:  DME Criminal Activity/Legal Involvement Pertinent to Current Situation/Hospitalization: No - Comment as needed  Activities of Daily Living      Permission Sought/Granted Permission sought to share information with : Family Supports Permission granted to share information with : Yes, Verbal Permission Granted  Share Information with NAME: Katherine Carrillo     Permission granted to share info w Relationship: SNF     Emotional Assessment Appearance:: Appears stated age Attitude/Demeanor/Rapport: Engaged Affect (typically observed): Appropriate Orientation: : Oriented to Self,Oriented to Place,Oriented to  Time,Oriented to Situation Alcohol / Substance Use: Not Applicable Psych Involvement: No (comment)  Admission diagnosis:  Aphasia [R47.01] Stroke [facility] [place] [facility]) [I63.9] Cerebrovascular accident (CVA), unspecified mechanism ([place]) [I63.9] Patient Active Problem List   Diagnosis Date Noted  . Aphasia   . Stroke ([place]) 09/20/2019  . Mixed hyperlipidemia   . Aortic atherosclerosis ([place]) 03/26/2018  . History of transient ischemic attack (TIA) 12/22/2016  . Dysarthria 12/22/2016  . Essential hypertension 12/22/2016  . History of stroke 12/22/2016  . PVD (peripheral vascular disease) ([place]) 12/22/2016   PCP:  Curt Jews, PA-C Pharmacy[facility]   [id number] [address] [id number]: [phone]: [phone]    Social Determinants of Health (SDOH) Interventions    Readmission Risk Interventions No flowsheet data found.

## 2021-01-23 NOTE — Plan of Care (Signed)
  Problem: Education: Goal: Knowledge of General Education information will improve Description: Including pain rating scale, medication(s)/side effects and non-pharmacologic comfort measures 01/23/2021 2015 by Bess Harvest, RN Outcome: Progressing 01/23/2021 2015 by Bess Harvest, RN Outcome: Progressing   Problem: Health Behavior/Discharge Planning: Goal: Ability to manage health-related needs will improve 01/23/2021 2015 by Bess Harvest, RN Outcome: Progressing 01/23/2021 2015 by Bess Harvest, RN Outcome: Progressing   Problem: Clinical Measurements: Goal: Ability to maintain clinical measurements within normal limits will improve 01/23/2021 2015 by Bess Harvest, RN Outcome: Progressing 01/23/2021 2015 by Bess Harvest, RN Outcome: Progressing Goal: Will remain free from infection 01/23/2021 2015 by Bess Harvest, RN Outcome: Progressing 01/23/2021 2015 by Bess Harvest, RN Outcome: Progressing Goal: Diagnostic test results will improve 01/23/2021 2015 by Bess Harvest, RN Outcome: Progressing 01/23/2021 2015 by Bess Harvest, RN Outcome: Progressing Goal: Respiratory complications will improve 01/23/2021 2015 by Bess Harvest, RN Outcome: Progressing 01/23/2021 2015 by Bess Harvest, RN Outcome: Progressing Goal: Cardiovascular complication will be avoided 01/23/2021 2015 by Bess Harvest, RN Outcome: Progressing 01/23/2021 2015 by Bess Harvest, RN Outcome: Progressing   Problem: Activity: Goal: Risk for activity intolerance will decrease Outcome: Progressing   Problem: Nutrition: Goal: Adequate nutrition will be maintained Outcome: Progressing   Problem: Coping: Goal: Level of anxiety will decrease Outcome: Progressing   Problem: Elimination: Goal: Will not experience complications related to bowel motility Outcome: Progressing Goal: Will not experience complications related to urinary retention Outcome: Progressing   Problem: Pain  Managment: Goal: General experience of comfort will improve Outcome: Progressing   Problem: Safety: Goal: Ability to remain free from injury will improve Outcome: Progressing   Problem: Skin Integrity: Goal: Risk for impaired skin integrity will decrease Outcome: Progressing

## 2021-01-23 NOTE — Hospital Course (Addendum)
Katherine Carrillo is a 75 y.o. female presenting with stroke-like symptoms. PMH is significant for CVA, HTN, and HLD.   Stroke Patient admitted with dysarthria and dysphagia that had been present for a few days and neurology was consulted in the ED. CT scan showed no acute abnormality, CTA head and neck unremarkable. MRI revealed 2 punctate left frontal cortical infarcts as well as old infarcts. Echocardiogram with EF 03-21% grade 1 diastolic dysfunction, mild to moderate MVR, mild prolapse mitral valve, mild AVR, mild AVS. Labs showed LDL 83, UDS negative, A1c 5.3, and other labs unremarkable. Patient was alert and oriented but found to have dysarthria and intermitted difficulty with word finding; found to have decreased strength in all extremities and otherwise grossly normal neuro exam. Patient was continued on home Plavix and was started on daily aspirin which is to be continued for 3 weeks and then Plavix alone. Patient will be following-up with EP outpatient to determine if need for loop recorder. Patient was evaluated by speech therapy with dysphagia diet and recommendation for inpatient speech rehabilitation; PT/OT evaluations for home health, which was determined to be appropriate.  HTN Blood pressure was significantly elevate don admission and patient required one dose of IV hydralazine initially. Once patient was cleared for oral medications by speech therapy, home Lisinopril restarted and blood pressure remained stable during the rest of the hospitalization.  Carvedilol restarted at discharge.  HLD Patient LDL 83, patient was started on Rosuvastatin 10 mg due to intolerance of other statin medications per chart review.  Check [facility] outpatient for statin intolerance, and see if patient

## 2021-01-23 NOTE — Progress Notes (Addendum)
STROKE TEAM PROGRESS NOTE   ATTENDING NOTE: I reviewed above note and agree with the assessment and plan. Pt was seen and examined.   75 year old female with history of hypertension, hyperlipidemia, CAD, PVD, TIA in 12/2016, stroke 08/2020 admitted for slurred speech and aphasia.  Symptoms wax and wane also associate with right hand weakness.  CT no acute abnormality.  CTA head and neck unremarkable.  MRI showed 2 punctate left frontal cortical infarcts, old left CR, right thalamus and bilateral cerebellar infarcts.  EF 60 to 65%.  LDL 83, A1c pending, UDS negative.  Creatinine 1.02.  Patient had TIA in 12/2016 and also a stroke 08/2019 for aphasia and right arm and face weakness.  Status post tPA at the time.  CT head and neck left M2/M3 occlusion.  MRI negative for acute infarct however concerning for DWI negative left MCA small infarct.  LDL 147, A1c 5.1, discharged on DAPT.  Had a 30-day cardiac event monitoring in 10/2019 showed no A. fib.  On exam, patient awake alert, sitting in bed, eating dinner.  Still has moderate dysarthria and intermittent word finding difficulty with paraphasic errors.  However orientated to place and time, stated her age of 86 instead of 19.  Visual fields full, no gaze palsy, no facial droop.  Bilateral upper extremity 4/5, bilateral lower extremities 3/5, symmetrical, sensation symmetrical, bilateral finger-to-nose grossly intact.  On this admission, previous occluded left M2/M3 in 08/2019 now recannulized on current CTA head and neck.  Therefore, etiology for patient's symptoms at this time likely again embolic stroke, concerning for cardioembolic source.  EP has discussed with patient and daughter about loop recorder, they would like to follow-up with EP as outpatient to further decide on that.  Recommend aspirin 81 and Plavix 75 DAPT for 3 weeks and then Plavix alone.  Increase Crestor 5 to 10 mg, and continue on discharge.  PT/OT recommend home health PT/OT.  For detailed  assessment and plan, please refer to above as I have made changes wherever appropriate.   Neurology will sign off. Please call with questions. Pt will follow up with stroke clinic NP at [facility] in about 4 weeks. Thanks for the consult.   Rosalin Hawking, MD PhD Stroke Neurology 01/23/2021 5:34 PM     INTERVAL HISTORY No visitors at bedside VSS. Neuro exam stable  Patient reports onset of dysarthria yesterday which prompted her to present with ED. Today she remains dysarthric and describes it as the same as when she came in to the ED. She denies any other new complaints or concerns. She reports living with her daughter and having help there if she needs it.   We discussed her stroke diagnosis and plan of care including ongoing work up and pending therapy evals, likely need for cardiac monitoring at discharge.   Vitals:   01/23/21 0002 01/23/21 0340 01/23/21 0849 01/23/21 1249  BP: (!) 177/82 125/63 (!) 145/65 (!) 154/74  Pulse: 85 83 79 (!) 101  Resp:  18 19 19   Temp: 98.3 F (36.8 C) 99.8 F (37.7 C) 98.2 F (36.8 C) 98.6 F (37 C)  TempSrc: Oral Oral Oral Oral  SpO2: 100% 99% 100% 100%  Weight:      Height:       CBC:  Recent Labs  Lab 01/22/21 1103  WBC 5.1  NEUTROABS 3.6  HGB 11.2*  HCT 35.1*  MCV 100.3*  PLT 366   Basic Metabolic Panel:  Recent Labs  Lab 01/22/21 1103  NA 137  K 5.1  CL 104  CO2 25  GLUCOSE 100*  BUN 17  CREATININE 1.02*  CALCIUM 9.2   Lipid Panel:  Recent Labs  Lab 01/23/21 0037  CHOL 156  TRIG 91  HDL 55  CHOLHDL 2.8  VLDL 18  LDLCALC 83   HgbA1c: No results for input(s): HGBA1C in the last 168 hours. Urine Drug Screen:  Recent Labs  Lab 01/22/21 1214  LABOPIA NONE DETECTED  COCAINSCRNUR NONE DETECTED  LABBENZ NONE DETECTED  AMPHETMU NONE DETECTED  THCU NONE DETECTED  LABBARB NONE DETECTED    Alcohol Level  Recent Labs  Lab 01/22/21 1103  ETH <10    IMAGING past 24 hours MR BRAIN WO CONTRAST  Result Date:  01/22/2021 CLINICAL DATA:  Stroke, follow-up. EXAM: MRI HEAD WITHOUT CONTRAST TECHNIQUE: Multiplanar, multiecho pulse sequences of the brain and surrounding structures were obtained without intravenous contrast. COMPARISON:  CT angiogram head/neck and non-contrast head CT 01/22/2021. brain MRI 09/21/2019. FINDINGS: Brain: Redemonstrated parietal lobe predominant cerebral atrophy. Mild generalized cerebellar atrophy. There are two acute cortical infarcts within the posterior left frontal lobe measuring up to 10 mm (series 5, image 84) (series 5, image 78). One of these infarcts involve the motor strip laterally. Redemonstrated chronic lacunar infarcts within the left corona radiata, right thalamus and bilateral cerebellar hemispheres. Moderate background multifocal T2/FLAIR hyperintensity within the cerebral white matter, nonspecific but compatible chronic small vessel ischemic disease. Redemonstrated chronic microhemorrhage within the right frontoparietal lobes (series 14, image 43). No evidence of intracranial mass. No extra-axial fluid collection. No midline shift. Vascular: Expected proximal arterial flow voids. Skull and upper cervical spine: No focal suspicious marrow lesion. Incompletely assessed cervical spondylosis. Sinuses/Orbits: Bilateral lens replacements. Visualized orbits show no acute finding. No significant paranasal sinus disease. IMPRESSION: Two acute cortical infarcts within the posterior left frontal lobe measuring up to 10 mm, one of which involves the motor strip. Otherwise stable non-contrast MRI appearance of the brain as compared to 09/21/2019. Chronic lacunar infarcts within the left corona radiata, right thalamus and bilateral cerebellar hemispheres. Background moderate cerebral white matter chronic small vessel ischemic disease. Parietal lobe predominant cerebral atrophy. Mild generalized cerebellar atrophy. Electronically Signed   By: Kellie Simmering DO   On: 01/22/2021 19:06   PHYSICAL  EXAM GEN: Elderly female lying in bed sitting up supporting self fully alert in NAD.  HEENT: [place]/AT Lungs: Respirations unlabored Ext: Warm and well perfused  Neurologic Examination: Mental Status: Awake, oriented x4. Speaks fluently but with dysarthria that is consistently comprehensible. Naming and repetition are intact.  Cranial Nerves: II:  VFF III,IV, VI: No ptosis. EOMI. No nystagmus.  V,VII: Smile symmetric, facial sensation intact V1-V3 VIII: Hearing intact to voice IX,X: No hypophonia XI: Symmetric XII: tongue deviation to right. Apparent labial/tongue weakness with constant protrusion.   Motor: RUE with coarse action tremor and 4/5 grip strength, with 5/5 strength more proximally. Mild bradykinesia to RUE, in addition to drift without pronation.  LUE 5/5; there is horizontal drift that is also slightly upwards when testing [place] (left cerebellar drift) RLE and LLE: 5/5 Sensory: Temp and light touch intact throughout, bilaterally. No extinction to DSS.  Cerebellar: intact HTS and upper extremity RAMs and arm rolling Gait: Deferred  ASSESSMENT/PLAN Katherine Carrillo is an 75 y.o. female with a PMHx of HTN, anemia, PVD and aborted stroke vs TIA s/p TPA 2021 (right facial droop, right arm drift and expressive aphasia) and remote  2011 Left sided stroke  (right sided weakness which resolved)  who presented to the hospital on 6/2 with new onset dysarthria.   Acute left frontal lobe strokes with unclear etiology which may have an embolic source.    Code Stroke HCT No acute intracranial finding   CTA head & neck  The common carotid, internal carotid and vertebral arteries are patent within the neck without hemodynamically significant stenosis. Unchanged mild calcified plaque at the origin of the non dominant right vertebral artery. No intracranial large vessel occlusion or proximal high-grade arterial stenosis. Mild nonstenotic calcified plaque within the intracranial internal  carotid arteries. Unchanged atherosclerotic irregularity of the bilateral posterior cerebral arteries.  MRI Two acute cortical infarcts within the posterior left frontal lobe measuring up to 10 mm, one of which involves the motor strip. Otherwise stable non-contrast MRI appearance of the brain as compared to 09/21/2019. Chronic lacunar infarcts within the left corona radiata, right thalamus and bilateral cerebellar hemispheres. Background moderate cerebral white matter chronic small vessel ischemic disease. Parietal lobe predominant cerebral atrophy. Mild generalized cerebellar atrophy.  2D Echo EF 60-65%. Grade I diastolic dysfunction. Mild to moderate MVR. Mild prolapse mitral valve. Mild AVR. Mild AVS. No shunt, thrombus or wall motion abnormality identified.   LDL 83  HgbA1c 5.1  VTE prophylaxis - Is recommended on 6/4 if remains inpatient but it appears patient will likely dc home    Diet   DIET - DYS 1 Room service appropriate? Yes; Fluid consistency: Thin   Passed for diet  On Plavix alone prior to admission  Recommend DAPT with ASA 81mg  and Plavix 75mg  x 3 weeks then return to Plavix monotherapy   Recommend long term cardiac monitoring at discharge however patient declined Loop recorder after discussion with Cardiology today and will be followed up with outpatient cardiology appointment.   Therapy recommendations:  Home   Disposition:  Home   Hypertension  Stable . Permissive hypertension (OK if < 220/120) but gradually normalize in 5-7 days . Long-term BP goal normotensive  Hyperlipidemia  Home meds: Crestor 5mg    LDL 83, goal < 70  Almost at goal. Consider increasing dose to 10mg .   Continue statin at discharge  Diabetes type II Uncontrolled  HgbA1c 5.1, goal < 7.0  CBGs Recent Labs    01/22/21 1049  GLUCAP 95      Management per primary team   Other Stroke Risk Factors  Advanced Age >/= 25  Overweight  Body mass index is 29.14 kg/m., BMI  >/= 30 associated with increased stroke risk, recommend weight loss, diet and exercise as appropriate   Hx stroke/TIA  PVD  Other Active Problems    Hospital day # 1  Delila A Bailey-Modzik, NP-C   To contact Stroke Continuity provider, please refer to [email or web address] After hours, contact General Neurology

## 2021-01-23 NOTE — Progress Notes (Signed)
   I discussed loop recorder indication and placement with patient and daughter at length.   They are overwhelmed with this admission and trying to make plans for care at home. Daughter is not ready to accept patient at home today.   The patient initially told me no, she would like to talk to her daughter and follow up as an outpatient. I offered to call her daughter who as above, is overwhelmed and prefer to be seen as an outpatient once things have "settled".    - CT head - No acute cortically based infarct or acute intracranial hemorrhage. Stable small vessel - MRI brain - Two acute cortical infarcts in the posterior left frontal lobe -CTA Head/Neck - carotids and vertebral arteries without significant stenosis. No intracranial large vessel occlusion or proximal high-grade arterial stenosis - Echo LVEF 60-65%, Grade 1 DD, mild/mod MR, mild mod AI, LAE 2.1 cm  Monitor 10/2019 after previous stroke showed NSR without arrhythmias  Follow up made with Dr. Curt Bears who all above was discussed with for 6/28 for consultation and possible loop recorder placement at that time.    Legrand Como [address] [place], PA-C  01/23/2021 3:17 PM

## 2021-01-23 NOTE — Progress Notes (Signed)
Inpatient Rehab Admissions Coordinator:   Consult received.  Note SLP recommending CIR and PT/OT recommending home health.  Dr Naaman Plummer evaluated pt and feels recommendations for home health are appropriate; formal consult to follow.  Will sign off for CIR at this time.    Shann Medal, PT, DPT Admissions Coordinator [phone] 01/23/21  1:57 PM

## 2021-01-23 NOTE — Evaluation (Signed)
Clinical/Bedside Swallow Evaluation Patient Details  Name: Katherine Carrillo MRN: [id number] Date of Birth: 06/05/1946  Today's Date: 01/23/2021 Time: SLP Start Time (ACUTE ONLY): 0910 SLP Stop Time (ACUTE ONLY): 1000 SLP Time Calculation (min) (ACUTE ONLY): 50 min  Past Medical History:  Past Medical History:  Diagnosis Date  . Anemia   . Cervical cancer ([place])   . Hypertension   . Peripheral vascular disease ([place])   . Stroke [facility])    Past Surgical History:  Past Surgical History:  Procedure Laterality Date  . ABDOMINAL HYSTERECTOMY    . CESAREAN SECTION    . FEMORAL ARTERY - FEMORAL ARTERY BYPASS GRAFT    . LEFT HEART CATH AND CORONARY ANGIOGRAPHY N/A 03/27/2018   Procedure: LEFT HEART CATH AND CORONARY ANGIOGRAPHY;  Surgeon: Lorretta Harp, MD;  Location: [place] [facility];  Service: Cardiovascular;  Laterality: N/A;  . TONSILLECTOMY     HPI:  Katherine Carrillo is a 75 y.o. female presenting with stroke-like symptoms. PMH is significant for CVA, HTN, and HLD. MRI shows two acute cortical infarcts within the posterior left frontal lobe, measuring up to 10 mm, one of which involves the motor strip.   Assessment / Plan / Recommendation Clinical Impression  Pt demonstrates moderate right labial weakness and decreased lingual strength, but relatively good ROM. This impacts her oral phase with anterior spillage on right with awareness and mild buccal residue. Pt does initiate clearing of this residue with a washcloth. Instructed her on pressure to her cheek and use of yankauer. No signs of aspiration are seen with thin or puree. Did not test solids given that pt has no dentures present and usually wears them. Suspect she may need puree for a while in any case. Will initiate puree and thin liquids and f/u for tolerance. SLP Visit Diagnosis: Dysphagia, oral phase (R13.11)    Aspiration Risk  Mild aspiration risk    Diet Recommendation Dysphagia 1 (Puree);Thin liquid   Liquid Administration  via: Cup;Straw Medication Administration: Whole meds with puree Supervision: Patient able to self feed Compensations: Slow rate;Small sips/bites;Lingual sweep for clearance of pocketing Postural Changes: Seated upright at 90 degrees    Other  Recommendations Oral Care Recommendations: Oral care before and after PO   Follow up Recommendations Inpatient Rehab      Frequency and Duration min 2x/week  2 weeks       Prognosis Prognosis for Safe Diet Advancement: Good      Swallow Study   General HPI: Katherine Carrillo is a 75 y.o. female presenting with stroke-like symptoms. PMH is significant for CVA, HTN, and HLD. MRI shows two acute cortical infarcts within the posterior left frontal lobe, measuring up to 10 mm, one of which involves the motor strip. Type of Study: Bedside Swallow Evaluation Previous Swallow Assessment: none Diet Prior to this Study: NPO Temperature Spikes Noted: No Respiratory Status: Room air History of Recent Intubation: No Behavior/Cognition: Alert;Cooperative;Pleasant mood Oral Cavity Assessment: Within Functional Limits Oral Care Completed by SLP: No Oral Cavity - Dentition: Edentulous;Dentures, not available Vision: Functional for self-feeding Self-Feeding Abilities: Able to feed self Patient Positioning: Upright in bed Baseline Vocal Quality: Normal Volitional Cough: Strong Volitional Swallow: Able to elicit    Oral/Motor/Sensory Function Overall Oral Motor/Sensory Function: Moderate impairment Facial ROM: Reduced right;Suspected CN VII (facial) dysfunction Facial Symmetry: Abnormal symmetry right;Suspected CN VII (facial) dysfunction Facial Strength: Reduced right;Suspected CN VII (facial) dysfunction Facial Sensation: Within Functional Limits Lingual ROM: Within Functional Limits Lingual Symmetry: Within Functional Limits  Lingual Strength: Reduced;Suspected CN XII (hypoglossal) dysfunction   Ice Chips Ice chips: Impaired Presentation: Spoon Oral  Phase Impairments: Reduced lingual movement/coordination;Reduced labial seal   Thin Liquid Thin Liquid: Impaired Presentation: Straw;Cup Oral Phase Impairments: Reduced labial seal;Reduced lingual movement/coordination Oral Phase Functional Implications: Oral residue;Right lateral sulci pocketing;Right anterior spillage    Nectar Thick Nectar Thick Liquid: Not tested   Honey Thick Honey Thick Liquid: Not tested   Puree Puree: Impaired Presentation: Spoon Oral Phase Impairments: Reduced lingual movement/coordination;Reduced labial seal Oral Phase Functional Implications: Oral residue;Prolonged oral transit;Right anterior spillage   Solid     Solid: Not tested      [name] 01/23/2021,10:30 AM

## 2021-01-24 DIAGNOSIS — I639 Cerebral infarction, unspecified: Secondary | ICD-10-CM | POA: Diagnosis not present

## 2021-01-24 LAB — BASIC METABOLIC PANEL
Anion gap: 10 (ref 5–15)
BUN: 22 mg/dL (ref 8–23)
CO2: 24 mmol/L (ref 22–32)
Calcium: 8.8 mg/dL — ABNORMAL LOW (ref 8.9–10.3)
Chloride: 103 mmol/L (ref 98–111)
Creatinine, Ser: 1.05 mg/dL — ABNORMAL HIGH (ref 0.44–1.00)
GFR, Estimated: 55 mL/min — ABNORMAL LOW (ref >60)
Glucose, Bld: 83 mg/dL (ref 70–99)
Potassium: 4.2 mmol/L (ref 3.5–5.1)
Sodium: 137 mmol/L (ref 135–145)

## 2021-01-24 LAB — CBC
HCT: 29.6 % — ABNORMAL LOW (ref 36.0–46.0)
Hemoglobin: 9.7 g/dL — ABNORMAL LOW (ref 12.0–15.0)
MCH: 33 pg (ref 26.0–34.0)
MCHC: 32.8 g/dL (ref 30.0–36.0)
MCV: 100.7 fL — ABNORMAL HIGH (ref 80.0–100.0)
Platelets: 151 10*3/uL (ref 150–400)
RBC: 2.94 MIL/uL — ABNORMAL LOW (ref 3.87–5.11)
RDW: 12.5 % (ref 11.5–15.5)
WBC: 5.2 10*3/uL (ref 4.0–10.5)
nRBC: 0 % (ref 0.0–0.2)

## 2021-01-24 LAB — CK: Total CK: 100 U/L (ref 38–234)

## 2021-01-24 MED ORDER — POLYETHYLENE GLYCOL 3350 17 G PO PACK: 17.0000 g | PACK | Freq: Every day | ORAL | Status: DC

## 2021-01-24 MED ORDER — ASPIRIN 81 MG PO TBEC: 81.0000 mg | DELAYED_RELEASE_TABLET | Freq: Every day | ORAL | 0 refills | Status: AC

## 2021-01-24 MED ORDER — SENNOSIDES-DOCUSATE SODIUM 8.6-50 MG PO TABS
1.0000 | ORAL_TABLET | Freq: Once | ORAL | Status: AC
  Administered 2021-01-24: 1 via ORAL
  Filled 2021-01-24: qty 1

## 2021-01-24 MED ORDER — POLYETHYLENE GLYCOL 3350 17 G PO PACK: 17.0000 g | PACK | Freq: Every day | ORAL | 0 refills | Status: AC

## 2021-01-24 MED ORDER — ROSUVASTATIN CALCIUM 10 MG PO TABS: 10.0000 mg | ORAL_TABLET | Freq: Every day | ORAL | 0 refills | Status: AC

## 2021-01-24 NOTE — Discharge Instructions (Signed)
Cognitive Rehabilitation After a Stroke After a stroke, you may have various problems with thinking (cognitive disability). The types of problems you have will depend on how severe the stroke was and where it was located in the brain. Problems may include:  Problems with short-term memory.  Trouble paying attention.  Trouble communicating or understanding language (aphasia).  A drop in mental ability that may interfere with daily life (dementia).  Trouble with problem-solving and information processing.  Problems with reading, writing, or math.  Problems with your ability to plan and to perform activities in sequence (executive function). These problems can feel overwhelming. However, with rehabilitation and time to heal, many people have improvement in their symptoms. What causes cognitive disability? A stroke happens when blood cannot flow to certain areas of the brain. When this happens, brain cells die in the affected areas because they cannot get oxygen and nutrients from the blood. Cognitive disability is caused by the death of cells in the areas of the brain that control thinking. What is cognitive rehabilitation? Cognitive rehabilitation is a program to help you improve your thinking skills after a stroke. Rehabilitation cannot completely reverse the effects of a stroke, but it can help you with memory, problem-solving, and communication skills. Therapy focuses on:  Improving brain function. This may involve activities such as learning to break down tasks into simple steps.  Helping you learn ways to cope with thinking problems. For example, you might learn memory tricks or do activities that stimulate memory, such as naming objects or describing pictures. Cognitive rehabilitation may include:  Speech-language therapy to help you understand and use language to communicate.  Occupational therapy to help you perform daily activities.  Music therapy to help relieve stress,  anxiety, and depression. This may involve listening to music, singing, or playing instruments.  Physical therapy to help improve your ability to move and perform actions that involve the muscles (motor functions).   When will therapy start and where will I have therapy? Your health care provider will decide when it is best for you to start therapy. In some cases, people start rehabilitation as soon as their health is stable, which may be 24-48 hours after the stroke. Rehabilitation can take place in a few different places, based on your needs. It may take place in:  The hospital or an in-patient rehabilitation hospital.  An outpatient rehabilitation facility.  A long-term care facility.  A community rehabilitation clinic.  Your home. What are some tools to help after a stroke? There are a number of tools and apps that you can use on your smartphone, personal computer, or tablet to help improve brain function. Some of these apps include:  Calendar reminders or alarm apps to help with memory.  Note-taking or sketch pad apps to help with memory or communication.  Text-to-speech apps that allow you to listen to what you are reading, which helps your ability to understanding text.  Picture dictionary or picture message apps to help with communication.  E-readers. These can highlight text as it is read aloud, which helps with listening and reading skills. How can my friends or family help during my rehabilitation? During your recovery, it is important that your friends and family members help you work toward more independence. Your caregivers should speak with your health care providers to learn how they can best help you during recovery. This may include working on speech-language or memory exercises at home, or helping with daily tasks and errands. If you have cognitive  disability, you may be at risk for injury or accidents at home, such as forgetting to turn off the stove. Friends and  family members can help ensure home safety by taking steps such as getting appliances with automatic shut-off features or storing dangerous objects in a secure place. What else should I know about cognitive rehabilitation after a stroke? Having trouble with memory and problem-solving can make you feel alone. You may also have mood changes, anxiety, or depression after a stroke. It is important to:  Stay connected with others through social groups, online support groups, or your community.  Talk to your friends, family, and caregivers about any emotional problems you are having.  Go to one-on-one or group therapy as suggested by your health care provider.  Stay physically active and exercise as often as suggested by your health care provider. Summary  After a stroke, some people have problems with thinking that affect attention, memory, language, communication, and problem-solving.  Cognitive rehabilitation is a program to help you regain brain function and learn skills to cope with thinking problems.  Rehabilitation cannot completely reverse the effects of a stroke, but it can help to improve quality of life.  Cognitive rehabilitation may include speech-language therapy, occupational therapy, music therapy, and physical therapy. This information is not intended to replace advice given to you by your health care provider. Make sure you discuss any questions you have with your health care provider. Document Revised: 11/29/2018 Document Reviewed: 11/12/2016 Elsevier Patient Education  2021 [place] is a language disorder that affects the part of your brain that you use to communicate. Aphasia does not affect your intelligence, but you may have trouble:  Speaking.  Understanding speech.  Reading.  Writing. Some people with aphasia may also have trouble with memory or attention. Aphasia can happen to anyone at any age, but it is most common in older adults. What  are the causes? This condition is caused by damage to the language centers of the brain. Damage may be caused by:  Stroke. This causes a disruption of blood flow to certain areas of the brain. Stroke is the most common cause of aphasia.  Traumatic brain injury (TBI).  Brain tumor.  Infection of the brain tissues.  Nervous system disease that gradually gets worse (progressiveneurological disorder), such as dementia or multiple sclerosis (MS).  Brain surgery. What are the signs or symptoms? Symptoms of this condition include:  Trouble finding the right words.  Difficulty expressing thoughts and needs through speech.  Using the wrong words, nonsense words, or jargon.  Talking in sentences that do not make sense or that are not grammatically correct.  Being unable to repeat back words and phrases.  Difficulty expressing ideas through writing.  Difficulty comprehending when reading.  Being unable to understand other people's speech.  Having trouble understanding numbers. The condition affects people differently. Symptoms may start suddenly or come on gradually, depending on the underlying cause. How is this diagnosed? This condition may be diagnosed based on a screening of your ability to communicate as soon as symptoms start, or are medically stable after a stroke or brain injury. Later, a more comprehensive assessment may be conducted either in the hospital or rehabilitation center. The assessment may test your ability to:  Use speech to communicate your personal needs.  Use muscles in your mouth and throat for speaking and swallowing.  Express ideas with speech or other means of communication, such as hand gestures.  Make conversation with  others across a variety of topics.  Hear and understand speech.  Understand and produce written material.  Manage memory and attention associated with communication. How is this treated? Treatment for this condition depends on your  needs and abilities. The goal is to help restore your ability to communicate or find ways to manage communication challenges. Common treatments include:  Speech-language therapy. Part of this may include: ? Re-building intonation, sentence structure, and vocabulary. ? Learning other ways to communicate, such as using word books, communication boards, or special software programs. ? Learning to communicate with writing, sign language, or hand gestures. ? Using a combination of methods to communicate.  Working with family members. This may include: ? Learning ways to communicate. ? Emotional support.  Support groups.  Occupational therapy. This can help to find devices to assist with daily living activities. Treatment usually begins as soon as possible. It may begin while you are in the hospital and continue in a rehabilitation center or at home. In some cases, aphasia may improve quickly on its own. In other cases, recovery occurs more slowly over time. Follow these instructions at home:  Keep all follow-up visits as told by your health care provider. This is important.  Make sure you have a good support system at home.  Find a support group. This can help you connect with others who are going through the same thing.  Try the following tips while communicating: ? Use short, simple sentences. Ask family members to do the same. Sentences that require one-word or short answers are easiest. ? Avoid distractions like background noise when trying to listen or talk. ? Try communicating with gestures, pointing, writing, or drawing. ? Talk slowly. Ask family members to talk to you slowly. ? Maintain eye contact when communicating. Ask family members to do the same when communicating with you. ? Ask family members to give you time to respond or to engage in conversation with them.   Contact a health care provider if:  Your symptoms change or get worse.  You are struggling with anxiety or  depression. Get help right away if you have:  Any symptoms of a stroke. "BE FAST" is an easy way to remember the main warning signs of a stroke: ? B - Balance. Signs are dizziness, sudden trouble walking, or loss of balance. ? E - Eyes. Signs are trouble seeing or a sudden change in vision. ? F - Face. Signs are a sudden weakness or numbness of the face, or the face or eyelid drooping on one side. ? A - Arms. Signs are weakness or numbness in an arm. This happens suddenly and usually on one side of the body. ? S - Speech. Signs are sudden trouble speaking, slurred speech, or trouble understanding what people say. ? T - Time. Time to call emergency service. Write down what time symptoms started.  Other signs of a stroke, such as: ? A sudden, severe headache with no known cause. ? Nausea or vomiting. ? Seizure. Summary  Aphasia is a language disorder that results from damage to the part of your brain that helps you use language to communicate. Aphasia does not affect your intelligence, but may cause difficulty with speech, writing, reading, or understanding others.  In some cases, aphasia may improve quickly on its own. In other cases, recovery occurs more slowly over time.  Get help right away if you have symptoms of a stroke. This information is not intended to replace advice given to you by your  health care provider. Make sure you discuss any questions you have with your health care provider. Document Revised: 07/27/2019 Document Reviewed: 09/06/2017 Elsevier Patient Education  2021 [place] were found to have 2 new areas of infarction (stroke) in your brain. It will be very important to take all of the medications that are prescribed to you to try and prevent further strokes. Cardiology recommended a loop recorder to see if there are any arrhythmias that could be contributing to your strokes, please make sure to keep your appointment with them for further discussion.   Take  Aspirin 81 mg daily for 3 weeks.  Continue Plavix 75 mg daily.  Continue Crestor 10 mg daily.  Can take Miralax as needed for constipation.  Follow-up with your primary care physician this coming week.    [facility]  [place], [place] [address] [phone]

## 2021-01-24 NOTE — Discharge Summary (Signed)
[facility] Discharge Summary  Patient name: Katherine Carrillo Medical record number: [id number] Date of birth: 1946-03-15 Age: 75 y.o. Gender: female Date of Admission: 01/22/2021  Date of Discharge: 01/24/21 Admitting Physician: Delora Fuel, MD  Primary Care Provider: Curt Jews, PA-C Consultants: Neuro  Indication for Hospitalization: Stroke  Discharge Diagnoses/Problem List:  CVA, HTN, HFpEF, and HLD.  Disposition: Able to be discharged home safely  Discharge Condition: Stable  Discharge Exam:   Physical Exam HENT:     Head: Normocephalic and atraumatic.     Mouth/Throat:     Mouth: Mucous membranes are moist.  Cardiovascular:     Rate and Rhythm: Normal rate and regular rhythm.  Pulmonary:     Effort: Pulmonary effort is normal. No respiratory distress.  Musculoskeletal:     Right lower leg: Edema present.     Left lower leg: Edema present.  Neurological:     Mental Status: She is alert and oriented to person, place, and time.     Motor: Weakness present.     Coordination: Coordination is intact.     Gait: Gait is intact.     Comments: Speech improved since admission but still Dysarthric and Aphasiac, weakness in right hand still.  Psychiatric:        Mood and Affect: Mood normal.        Behavior: Behavior normal.   Brief Hospital Course:  Katherine Carrillo is a 75 y.o. female presenting with stroke-like symptoms. PMH is significant for CVA, HTN, and HLD.   Stroke Patient admitted with dysarthria and dysphagia that had been present for a few days and neurology was consulted in the ED. CT scan showed no acute abnormality, CTA head and neck unremarkable. MRI revealed 2 punctate left frontal cortical infarcts as well as old infarcts. Echocardiogram with EF 69-62% grade 1 diastolic dysfunction, mild to moderate MVR, mild prolapse mitral valve, mild AVR, mild AVS. Labs showed LDL 83, UDS negative, A1c 5.3, and other labs unremarkable. Patient was  alert and oriented but found to have dysarthria and intermitted difficulty with word finding; found to have decreased strength in all extremities and otherwise grossly normal neuro exam. Patient was continued on home Plavix and was started on daily aspirin which is to be continued for 3 weeks and then Plavix alone. Patient will be following-up with EP outpatient to determine if need for loop recorder. Patient was evaluated by speech therapy with dysphagia diet and recommendation for inpatient speech rehabilitation; PT/OT evaluations for home health, which was determined to be appropriate.  HTN Blood pressure was significantly elevate don admission and patient required one dose of IV hydralazine initially. Once patient was cleared for oral medications by speech therapy, home Lisinopril restarted and blood pressure remained stable during the rest of the hospitalization.  Carvedilol restarted at discharge.  HLD Patient LDL 83, patient was started on Rosuvastatin 10 mg due to intolerance of other statin medications per chart review.  Check CK outpatient for statin intolerance,    Issues for follow-up: 1. Follow up with EP outpatient to determine if need for loop recorder 2. To continue on Plavix and ASA 81mg  for 3 weeks followed by Plavix alone.  3. Patient restarted on Rosuvastatin, ensure tolerance and compliance with medication.  May want to consider PCSK9 inhibitors if statin intolerance.   Significant Procedures:   Significant Labs and Imaging:  Recent Labs  Lab 01/22/21 1103 01/24/21 0258  WBC 5.1 5.2  HGB 11.2* 9.7*  HCT 35.1*  29.6*  PLT 159 151   Recent Labs  Lab 01/22/21 1103 01/24/21 0258  NA 137 137  K 5.1 4.2  CL 104 103  CO2 25 24  GLUCOSE 100* 83  BUN 17 22  CREATININE 1.02* 1.05*  CALCIUM 9.2 8.8*  ALKPHOS 52  --   AST 20  --   ALT 12  --   ALBUMIN 4.0  --      Results/Tests Pending at Time of Discharge: None  Discharge Medications:  Allergies as of  01/24/2021      Reactions   Oxycontin [oxycodone] Other (See Comments)   Pt states it makes her "crazy"   Caffeine Anxiety   Codeine Anxiety      Medication List    TAKE these medications   aspirin 81 MG EC tablet Take 1 tablet (81 mg total) by mouth daily for 21 days. Swallow whole.   carvedilol 12.5 MG tablet Commonly known as: COREG Take 12.5 mg by mouth 2 (two) times daily with a meal.   clopidogrel 75 MG tablet Commonly known as: PLAVIX Take 1 tablet (75 mg total) by mouth daily.   ferrous sulfate 325 (65 FE) MG EC tablet Take 325 mg by mouth daily.   Fish Oil 1000 MG Caps Take 1,000 mg by mouth at bedtime.   fluticasone 50 MCG/ACT nasal spray Commonly known as: FLONASE Place 1 spray into both nostrils daily.   GARLIC PO Take 1 tablet by mouth daily. Dose unknown   lisinopril 10 MG tablet Commonly known as: ZESTRIL Take 10 mg by mouth daily.   multivitamin with minerals Tabs tablet Take 1 tablet by mouth daily.   polyethylene glycol 17 g packet Commonly known as: MIRALAX / GLYCOLAX Take 17 g by mouth daily.   Propylene Glycol 0.6 % Soln Place 1 drop into both eyes daily.   rosuvastatin 10 MG tablet Commonly known as: Crestor Take 1 tablet (10 mg total) by mouth at bedtime. What changed:   medication strength  how much to take            Durable Medical Equipment  (From admission, onward)         Start     Ordered   01/24/21 0727  For home use only DME 3 n 1  Once        01/24/21 7026          Discharge Instructions: Please refer to Patient Instructions section of EMR for full details.  Patient was counseled important signs and symptoms that should prompt return to medical care, changes in medications, dietary instructions, activity restrictions, and follow up appointments.   Follow-Up Appointments:  Follow-up Information    [facility] Follow up.   Specialty: Home Health Services Why: Representative from [facility] will  contact you to schedule start of home health services. Contact information: [place] [address] [phone]        [facility]. Schedule an appointment as soon as possible for a visit in 4 week(s).   Specialty: Neurology Contact information: [address] [place] [phone]              Delora Fuel, MD 01/24/2021, 8:38 AM PGY-1, [place]

## 2021-01-24 NOTE — Progress Notes (Signed)
FPTS Interim Progress Note  Patient sleeping and resting comfortably.  Rounded with primary RN.  No concerns voiced.  No orders required.  Appreciated nightly round.  Today's Vitals   01/23/21 1600 01/23/21 1731 01/23/21 2000 01/23/21 2333  BP:  127/76 136/89 (!) 159/83  Pulse:  92 82 84  Resp:  (!) 21 16 20   Temp:  98.5 F (36.9 C) 98.4 F (36.9 C) 98.8 F (37.1 C)  TempSrc:  Oral Oral Oral  SpO2:  100% 100% 100%  Weight:      Height:      PainSc: 0-No pain       Carollee Leitz, MD Family Medicine Residency

## 2021-01-24 NOTE — Progress Notes (Signed)
Family Medicine Teaching Service Daily Progress Note Intern Pager: [phone]  Patient name: Katherine Carrillo Medical record number: [id number] Date of birth: 03-13-1946 Age: 75 y.o. Gender: female  Primary Care Provider: Curt Jews, PA-C Consultants: Neuro Code Status: DNR  Pt Overview and Major Events to Date:  6/2 Admitted  Assessment and Plan: Katherine Carrillo a 75 y.o.femalepresenting with stroke-like symptoms. PMH is significant forCVA, HTN, and HLD.  Stroke Speech improved since admission but still Dysarthric and Aphasiac.  Weakness in right hand.  Recommending home health PT/OT.  Hgb 11.2 > 9.7.  Likely dilutional from mIVF.  ECHO shows EF 60-65%.  Neuro signed off.    - Aspirin 81 mg daily for 3 weeks - Plavix 75 mg daily - Home Health PT/OT   HTN BPs ranging from 120-150.  On Lisinopril and CoReg at home. - Continue Lisinopril - Hold home CoReg  HLD LDL is 83.  Has reported statin intolerance.  Not on any at home.  Started on Crestor 10 mg. - Continue Crestor 10 mg - Obtain baseline CK     FEN/GI: Dysphasia 1 diet PPx: SCD's  Status is: Inpatient  Remains inpatient appropriate because:Inpatient level of care appropriate due to severity of illness   Dispo: The patient is from: Home              Anticipated d/c is to: Home              Patient currently is medically stable to d/c.   Difficult to place patient No   Subjective:  Patient denies headache this AM.  Denies any bleeding or bruising.  Does endorse constipation.    Objective: Temp:  [98.2 F (36.8 C)-98.8 F (37.1 C)] 98.8 F (37.1 C) (06/04 0410) Pulse Rate:  [79-101] 80 (06/04 0410) Resp:  [16-21] 20 (06/03 2333) BP: (127-159)/(65-89) 127/65 (06/04 0410) SpO2:  [98 %-100 %] 98 % (06/04 0410) Physical Exam:  Physical Exam HENT:     Head: Normocephalic and atraumatic.     Mouth/Throat:     Mouth: Mucous membranes are moist.  Cardiovascular:     Rate and Rhythm: Normal rate and  regular rhythm.  Pulmonary:     Effort: Pulmonary effort is normal. No respiratory distress.  Musculoskeletal:     Right lower leg: Edema present.     Left lower leg: Edema present.  Neurological:     Mental Status: She is alert and oriented to person, place, and time.     Motor: Weakness present.     Coordination: Coordination is intact.     Gait: Gait is intact.     Comments: Speech improved since admission but still Dysarthric and Aphasiac, weakness in right hand still.  Psychiatric:        Mood and Affect: Mood normal.        Behavior: Behavior normal.     Laboratory: Recent Labs  Lab 01/22/21 1103 01/24/21 0258  WBC 5.1 5.2  HGB 11.2* 9.7*  HCT 35.1* 29.6*  PLT 159 151   Recent Labs  Lab 01/22/21 1103 01/24/21 0258  NA 137 137  K 5.1 4.2  CL 104 103  CO2 25 24  BUN 17 22  CREATININE 1.02* 1.05*  CALCIUM 9.2 8.8*  PROT 7.5  --   BILITOT 0.8  --   ALKPHOS 52  --   ALT 12  --   AST 20  --   GLUCOSE 100* 83    Imaging/Diagnostic Tests: No new imaging  Delora Fuel, MD 01/24/2021, 6:23 AM PGY-1, [place] Intern pager: [phone], text pages welcome

## 2021-01-24 NOTE — TOC Transition Note (Signed)
Transition of Care [facility]) - CM/SW Discharge Note   Patient Details  Name: Katherine Carrillo MRN: [id number] Date of Birth: 04-24-1946  Transition of Care [facility]) CM/SW Contact:  Carles Collet, RN Phone Number: 01/24/2021, 3:26 PM   Clinical Narrative:    [name] w patient's daughter, [name] on the phone. She states that she is at work until [place] and cannot pick up her mother or arrange for other transportation. She states that she has the only key to the house and she is in [facility]. She states that she cannot leave work. We discussed that extended stay beyond medical necessity, and beyond written discharge order may not be covered by insurance. [name] remains unable/ unwilling to accommodate DC. Avoidable day entered. Dr Rock Nephew notified.  Verified [place] services with [facility], ordered 3/1 to room for discharge.     [place],Tracy Daughter [phone]       Final next level of care: [place] Barriers to Discharge: No Barriers Identified   Patient Goals and CMS Choice Patient states their goals for this hospitalization and ongoing recovery are:: get back home CMS Medicare.gov Compare Post Acute Care list provided to:: Patient Choice offered to / list presented to : Patient  Discharge Placement                       Discharge Plan and Services     Post Acute Care Choice: Home Health          DME Arranged: 3-N-1 DME Agency: [employer] Date DME Agency Contacted: 01/24/21 Time DME Agency Contacted: [id number] Representative spoke with at DME Agency: [place]: [place]: [place] Date [place]: 01/24/21 Time [place]: [place] Representative spoke with at [place]: [place] ([place]) Interventions     Readmission Risk Interventions No flowsheet data found.

## 2021-01-24 NOTE — Progress Notes (Signed)
Physical Therapy Treatment Patient Details Name: Katherine Carrillo MRN: [id number] DOB: [birth year] Today's Date: 01/24/2021    History of Present Illness Katherine Carrillo is a 75 y.o. female presenting with difficulty speaking and slurring of words. MRI with 2 acute cortical infarcts in the posterior left frontal lobe, chronic lacunar infarcts present. PMH is significant for CVA, HTN, and HLD.    PT Comments    The pt was agreeable to participate in PT session this afternoon with focus on final education and stair training prior to anticipated d/c home with family. However, the pt fatigued after ~50 ft ambulation with RW and declined stair training due to fatigue. The pt was able to demo good stability with initial transfers both with and without UE support, but does benefit from UE support for increased safety at this time. The pt was able to complete short distance ambulation in her room without AD or UE support, but requires RW for stability and endurance with hallway ambulation. Discussed stair navigation at length due to decreased problem solving abilities of pt, pt verbalizing understanding of discussed plan. All education completed at this time, the pt will continue to benefit from skilled PT to progress dynamic stability and independence with tranfers following d/c home.    Follow Up Recommendations  Home health PT;Supervision for mobility/OOB     Equipment Recommendations  3in1 (PT) (pt has RW)    Recommendations for Other Services       Precautions / Restrictions Precautions Precautions: Fall Restrictions Weight Bearing Restrictions: No    Mobility  Bed Mobility Overal bed mobility: Needs Assistance Bed Mobility: Supine to Sit     Supine to sit: Modified independent (Device/Increase time)     General bed mobility comments: pt with use of bed rails and increased time and effort. no LOB sitting EOB, cues needed to complete scooting to EOB    Transfers Overall transfer level:  Needs assistance Equipment used: None Transfers: Sit to/from Stand Sit to Stand: Min guard         General transfer comment: Pt transferred with min guard for safety.  Ambulation/Gait Ambulation/Gait assistance: Supervision Gait Distance (Feet): 100 Feet Assistive device: Rolling walker (2 wheeled) Gait Pattern/deviations: Step-through pattern;Decreased step length - right;Decreased step length - left;Decreased dorsiflexion - right;Narrow base of support Gait velocity: 0.33 m/s Gait velocity interpretation: <1.31 ft/sec, indicative of household ambulator General Gait Details: small steps with limited stride length. able to complete short distances without UE support, hallway ambulation with RW. decreased clearance RLE   Stairs Stairs:  (pt declined stairs today due to fatigue. discussed verbally)              Balance Overall balance assessment: Needs assistance Sitting-balance support: Feet supported Sitting balance-Leahy Scale: Good     Standing balance support: Bilateral upper extremity supported;Single extremity supported;During functional activity Standing balance-Leahy Scale: Fair Standing balance comment: Pt benefitted from single UE support from therapist, but preference for supporting self on objects in room.                            Cognition Arousal/Alertness: Awake/alert Behavior During Therapy: WFL for tasks assessed/performed Overall Cognitive Status: Impaired/Different from baseline Area of Impairment: Memory;Safety/judgement;Awareness;Problem solving;Following commands;Attention                   Current Attention Level: Sustained Memory: Decreased short-term memory Following Commands: Follows one step commands with increased time Safety/Judgement: Decreased awareness of safety;Decreased awareness  of deficits Awareness: Intellectual Problem Solving: Difficulty sequencing;Requires verbal cues General Comments: pt with decreased  awareness and awareness of safety. limited problem solving without cues and repeated questions to encourage pt to think through situations.      Exercises      General Comments General comments (skin integrity, edema, etc.): VSS on RA      Pertinent Vitals/Pain Pain Assessment: Faces Faces Pain Scale: No hurt Pain Intervention(s): Monitored during session           PT Goals (current goals can now be found in the care plan section) Acute Rehab PT Goals Patient Stated Goal: none stated Time For Goal Achievement: 02/06/21 Progress towards PT goals: Progressing toward goals    Frequency    Min 4X/week      PT Plan Current plan remains appropriate       AM-PAC PT "6 Clicks" Mobility   Outcome Measure  Help needed turning from your back to your side while in a flat bed without using bedrails?: A Little Help needed moving from lying on your back to sitting on the side of a flat bed without using bedrails?: A Little Help needed moving to and from a bed to a chair (including a wheelchair)?: A Little Help needed standing up from a chair using your arms (e.g., wheelchair or bedside chair)?: A Little Help needed to walk in hospital room?: A Little Help needed climbing 3-5 steps with a railing? : A Lot 6 Click Score: 17    End of Session Equipment Utilized During Treatment: Gait belt Activity Tolerance: Patient limited by fatigue Patient left: with call bell/phone within reach;in bed;with bed alarm set;with nursing/sitter in room Nurse Communication: Mobility status PT Visit Diagnosis: Unsteadiness on feet (R26.81);Muscle weakness (generalized) (M62.81);Difficulty in walking, not elsewhere classified (R26.2)     Time: 4332-9518 PT Time Calculation (min) (ACUTE ONLY): 25 min  Charges:  $Gait Training: 23-37 mins                     Karma Ganja, PT, DPT   Acute Rehabilitation Department Pager #: [phone]   Otho Bellows 01/24/2021, 2:33 PM

## 2021-01-25 DIAGNOSIS — I639 Cerebral infarction, unspecified: Secondary | ICD-10-CM | POA: Diagnosis not present

## 2021-01-25 NOTE — Plan of Care (Signed)
  Problem: Health Behavior/Discharge Planning: Goal: Ability to manage health-related needs will improve Outcome: Progressing   Problem: Activity: Goal: Risk for activity intolerance will decrease Outcome: Progressing   Problem: Nutrition: Goal: Adequate nutrition will be maintained Outcome: Progressing   

## 2021-01-25 NOTE — Progress Notes (Signed)
Pt discharge scheduled at change of shift. Dtr on the way for pick-up. Discharge paper printed. All Qs and concerns addressed. Pt voiced understanding. Teach-back method used.

## 2021-01-25 NOTE — Discharge Summary (Signed)
[facility] Discharge Summary  Patient name: Katherine Carrillo Medical record number: [id number] Date of birth: [birth year] Age: 75 y.o. Gender: female Date of Admission: 01/22/2021  Date of Discharge: 01/25/21 Admitting Physician: Delora Fuel, MD  Primary Care Provider: Curt Jews, PA-C Consultants: Neuro  Indication for Hospitalization: Stroke  Discharge Diagnoses/Problem List:  CVA, HTN, HFpEF, and HLD.  Disposition: Home  Discharge Condition: Stable  Discharge Exam:   Physical Exam GEN: pleasant elderly female, in no acute distress  CV: regular rate and rhythm RESP: no increased work of breathing, clear to ascultation bilaterally  ABD: Soft, non-tender, non-distended. MSK: b/l LEedema SKIN: warm, dry NEURO: CN XII weakness b/l, tremor in b.l hands, decreased grip strength in right hand and movement of right hand, dysarthric and aphasic speech    HENT:     Head: Normocephalic and atraumatic.     Mouth/Throat:     Mouth: Mucous membranes are moist.  Cardiovascular:     Rate and Rhythm: Normal rate and regular rhythm.  Pulmonary:     Effort: Pulmonary effort is normal. No respiratory distress.  Musculoskeletal:     Right lower leg: Edema present.     Left lower leg: Edema present.  Neurological:     Mental Status: She is alert and oriented to person, place, and time.     Motor: Weakness present.     Coordination: Coordination is intact.     Gait: Gait is intact.     Comments: Speech improved since admission but still Dysarthric and Aphasiac, weakness in right hand still.  Psychiatric:        Mood and Affect: Mood normal.        Behavior: Behavior normal.   Brief Hospital Course:  Katherine Carrillo is a 75 y.o. female presenting with stroke-like symptoms. PMH is significant for CVA, HTN, and HLD.   Stroke Patient admitted with dysarthria and dysphagia that had been present for a few days and neurology was consulted in the ED. CT scan  showed no acute abnormality, CTA head and neck unremarkable. MRI revealed 2 punctate left frontal cortical infarcts as well as old infarcts. Echocardiogram with EF 10-93% grade 1 diastolic dysfunction, mild to moderate MVR, mild prolapse mitral valve, mild AVR, mild AVS. Labs showed LDL 83, UDS negative, A1c 5.3, and other labs unremarkable. Patient was alert and oriented but found to have dysarthria and intermitted difficulty with word finding; found to have decreased strength in all extremities and otherwise grossly normal neuro exam. Patient was continued on home Plavix and was started on daily aspirin which is to be continued for 3 weeks and then Plavix alone. Patient will be following-up with EP outpatient to determine if need for loop recorder. Patient was evaluated by speech therapy with dysphagia diet and recommendation for inpatient speech rehabilitation; PT/OT evaluations for home health, which was determined to be appropriate.  HTN Blood pressure was significantly elevate don admission and patient required one dose of IV hydralazine initially. Once patient was cleared for oral medications by speech therapy, home Lisinopril restarted and blood pressure remained stable during the rest of the hospitalization.  Carvedilol restarted at discharge.  HLD Patient LDL 83, patient was started on Rosuvastatin 10 mg due to intolerance of other statin medications per chart review.  Check CK outpatient for statin intolerance, and see if patient      Significant Procedures:   Significant Labs and Imaging:  Recent Labs  Lab 01/22/21 1103 01/24/21 0258  WBC 5.1  5.2  HGB 11.2* 9.7*  HCT 35.1* 29.6*  PLT 159 151   Recent Labs  Lab 01/22/21 1103 01/24/21 0258  NA 137 137  K 5.1 4.2  CL 104 103  CO2 25 24  GLUCOSE 100* 83  BUN 17 22  CREATININE 1.02* 1.05*  CALCIUM 9.2 8.8*  ALKPHOS 52  --   AST 20  --   ALT 12  --   ALBUMIN 4.0  --      Results/Tests Pending at Time of Discharge:  None  Discharge Medications:  Allergies as of 01/25/2021      Reactions   Oxycontin [oxycodone] Other (See Comments)   Pt states it makes her "crazy"   Caffeine Anxiety   Codeine Anxiety      Medication List    TAKE these medications   aspirin 81 MG EC tablet Take 1 tablet (81 mg total) by mouth daily for 21 days. Swallow whole.   carvedilol 12.5 MG tablet Commonly known as: COREG Take 12.5 mg by mouth 2 (two) times daily with a meal.   clopidogrel 75 MG tablet Commonly known as: PLAVIX Take 1 tablet (75 mg total) by mouth daily.   ferrous sulfate 325 (65 FE) MG EC tablet Take 325 mg by mouth daily.   Fish Oil 1000 MG Caps Take 1,000 mg by mouth at bedtime.   fluticasone 50 MCG/ACT nasal spray Commonly known as: FLONASE Place 1 spray into both nostrils daily.   GARLIC PO Take 1 tablet by mouth daily. Dose unknown   lisinopril 10 MG tablet Commonly known as: ZESTRIL Take 10 mg by mouth daily.   multivitamin with minerals Tabs tablet Take 1 tablet by mouth daily.   polyethylene glycol 17 g packet Commonly known as: MIRALAX / GLYCOLAX Take 17 g by mouth daily.   Propylene Glycol 0.6 % Soln Place 1 drop into both eyes daily.   rosuvastatin 10 MG tablet Commonly known as: Crestor Take 1 tablet (10 mg total) by mouth at bedtime. What changed:   medication strength  how much to take            Durable Medical Equipment  (From admission, onward)         Start     Ordered   01/24/21 0727  For home use only DME 3 n 1  Once        01/24/21 6644          Discharge Instructions: Please refer to Patient Instructions section of EMR for full details.  Patient was counseled important signs and symptoms that should prompt return to medical care, changes in medications, dietary instructions, activity restrictions, and follow up appointments.   Follow-Up Appointments:  Follow-up Information    Care, [facility] Follow up.   Specialty: Home Health  Services Why: Representative from [facility] will contact you to schedule start of home health services. Contact information: [place] [phone]        [facility]. Schedule an appointment as soon as possible for a visit in 4 week(s).   Specialty: Neurology Contact information: [place] Victoria Marshall, [place], DO 01/25/2021, 8:23 AM PGY-2, [place]

## 2021-02-17 ENCOUNTER — Institutional Professional Consult (permissible substitution): Payer: Medicare Other | Admitting: Cardiology

## 2021-02-17 ENCOUNTER — Other Ambulatory Visit: Payer: Self-pay | Admitting: Family Medicine

## 2021-02-17 DIAGNOSIS — E782 Mixed hyperlipidemia: Secondary | ICD-10-CM

## 2021-02-20 DEATH — deceased

## 2021-04-21 ENCOUNTER — Inpatient Hospital Stay: Payer: Self-pay | Admitting: Adult Health

## 2021-04-21 NOTE — Progress Notes (Deleted)
[facility] [address] [place]. [place] [address] [phone]       [place] [name] Date of Birth:  08-13-1946 Medical Record Number:  [id number]   Reason for Referral:  hospital stroke follow up    SUBJECTIVE:   CHIEF COMPLAINT:  No chief complaint on file.   HPI:   Recent hospitalization 6-22 per Dr. Erlinda Hong:  Katherine Carrillo is a 75 year old female with history of hypertension, hyperlipidemia, CAD, PVD, TIA in 12/2016, and stroke 08/2020 admitted for slurred speech and aphasia.  Symptoms wax and wane also associate with right hand weakness.  CT no acute abnormality.  CTA head and neck unremarkable.  MRI showed 2 punctate left frontal cortical infarcts, old left CR, right thalamus and bilateral cerebellar infarcts.  EF 60 to 65%.  LDL 83, A1c pending, UDS negative.  Creatinine 1.02.   Patient had TIA in 12/2016 and also a stroke 08/2019 for aphasia and right arm and face weakness.  Status post tPA at the time.  CT head and neck left M2/M3 occlusion.  MRI negative for acute infarct however concerning for DWI negative left MCA small infarct.  LDL 147, A1c 5.1, discharged on DAPT.  Had a 30-day cardiac event monitoring in 10/2019 showed no A. fib.  On this admission, previous occluded left M2/M3 in 08/2019 now recannulized on current CTA head and neck.  Therefore, etiology for patient's symptoms at this time likely again embolic stroke, concerning for cardioembolic source.  EP has discussed with patient and daughter about loop recorder, they would like to follow-up with EP as outpatient to further decide on that.  Recommend aspirin 81 and Plavix 75 DAPT for 3 weeks and then Plavix alone.  Increase Crestor 5 to 10 mg, and continue on discharge.  PT/OT recommend home health PT/OT.          PERTINENT IMAGING Recent hospitalization 01/22/2021  Code Stroke HCT No acute intracranial finding  CTA head & neck  The common carotid, internal carotid  and vertebral arteries are patent within the neck without hemodynamically significant stenosis. Unchanged mild calcified plaque at the origin of the non dominant right vertebral artery. No intracranial large vessel occlusion or proximal high-grade arterial stenosis. Mild nonstenotic calcified plaque within the intracranial internal carotid arteries. Unchanged atherosclerotic irregularity of the bilateral posterior cerebral arteries. MRI Two acute cortical infarcts within the posterior left frontal lobe measuring up to 10 mm, one of which involves the motor strip. Otherwise stable non-contrast MRI appearance of the brain as compared to 09/21/2019. Chronic lacunar infarcts within the left corona radiata, right thalamus and bilateral cerebellar hemispheres. Background moderate cerebral white matter chronic small vessel ischemic disease. Parietal lobe predominant cerebral atrophy. Mild generalized cerebellar atrophy.  2D Echo EF 60-65%. Grade I diastolic dysfunction. Mild to moderate MVR. Mild prolapse mitral valve. Mild AVR. Mild AVS. No shunt, thrombus or wall motion abnormality identified.        ROS:   14 system review of systems performed and negative with exception of ***  PMH:  Past Medical History:  Diagnosis Date   Anemia    Cervical cancer (HCC)    Hypertension    Peripheral vascular disease ([place])    Stroke (HCC)     PSH:  Past Surgical History:  Procedure Laterality Date   ABDOMINAL HYSTERECTOMY     CESAREAN SECTION     FEMORAL ARTERY - FEMORAL ARTERY BYPASS GRAFT     LEFT HEART CATH AND CORONARY ANGIOGRAPHY  N/A 03/27/2018   Procedure: LEFT HEART CATH AND CORONARY ANGIOGRAPHY;  Surgeon: Lorretta Harp, MD;  Location: [facility];  Service: Cardiovascular;  Laterality: N/A;   TONSILLECTOMY      Social History:  Social History   Socioeconomic History   Marital status: Widowed    Spouse name: Not on file   Number of children: Not on file   Years of  education: Not on file   Highest education level: Not on file  Occupational History   Not on file  Tobacco Use   Smoking status: Never   Smokeless tobacco: Never  Vaping Use   Vaping Use: Never used  Substance and Sexual Activity   Alcohol use: No   Drug use: No   Sexual activity: Never    Birth control/protection: Surgical  Other Topics Concern   Not on file  Social History Narrative   Not on file   Social Determinants of Health   Financial Resource Strain: Not on file  Food Insecurity: Not on file  Transportation Needs: Not on file  Physical Activity: Not on file  Stress: Not on file  Social Connections: Not on file  Intimate Partner Violence: Not on file    Family History:  Family History  Problem Relation Age of Onset   Diabetes Sister    Diabetes Brother     Medications:   Current Outpatient Medications on File Prior to Visit  Medication Sig Dispense Refill   carvedilol (COREG) 12.5 MG tablet Take 12.5 mg by mouth 2 (two) times daily with a meal.     clopidogrel (PLAVIX) 75 MG tablet Take 1 tablet (75 mg total) by mouth daily. 30 tablet 2   ferrous sulfate 325 (65 FE) MG EC tablet Take 325 mg by mouth daily.  5   fluticasone (FLONASE) 50 MCG/ACT nasal spray Place 1 spray into both nostrils daily. 9.9 mL 0   GARLIC PO Take 1 tablet by mouth daily. Dose unknown     lisinopril (PRINIVIL,ZESTRIL) 10 MG tablet Take 10 mg by mouth daily.     Multiple Vitamin (MULTIVITAMIN WITH MINERALS) TABS tablet Take 1 tablet by mouth daily.     Omega-3 Fatty Acids (FISH OIL) 1000 MG CAPS Take 1,000 mg by mouth at bedtime.     polyethylene glycol (MIRALAX / GLYCOLAX) 17 g packet Take 17 g by mouth daily. 14 each 0   Propylene Glycol 0.6 % SOLN Place 1 drop into both eyes daily.     rosuvastatin (CRESTOR) 10 MG tablet Take 1 tablet (10 mg total) by mouth at bedtime. 30 tablet 0   No current facility-administered medications on file prior to visit.    Allergies:   Allergies   Allergen Reactions   Oxycontin [Oxycodone] Other (See Comments)    Pt states it makes her "crazy"   Caffeine Anxiety   Codeine Anxiety      OBJECTIVE:  Physical Exam  There were no vitals filed for this visit. There is no height or weight on file to calculate BMI. No results found.  No flowsheet data found.   General: well developed, well nourished, seated, in no evident distress Head: head normocephalic and atraumatic.   Neck: supple with no carotid or supraclavicular bruits Cardiovascular: regular rate and rhythm, no murmurs Musculoskeletal: no deformity Skin:  no rash/petichiae Vascular:  Normal pulses all extremities   Neurologic Exam Mental Status: Awake and fully alert. Oriented to place and time. Recent and remote memory intact. Attention span, concentration  and fund of knowledge appropriate. Mood and affect appropriate.  Cranial Nerves: Fundoscopic exam reveals sharp disc margins. Pupils equal, briskly reactive to light. Extraocular movements full without nystagmus. Visual fields full to confrontation. Hearing intact. Facial sensation intact. Face, tongue, palate moves normally and symmetrically.  Motor: Normal bulk and tone. Normal strength in all tested extremity muscles Sensory.: intact to touch , pinprick , position and vibratory sensation.  Coordination: Rapid alternating movements normal in all extremities. Finger-to-nose and heel-to-shin performed accurately bilaterally. Gait and Station: Arises from chair without difficulty. Stance is normal. Gait demonstrates normal stride length and balance with ***. Tandem walk and heel toe ***.  Reflexes: 1+ and symmetric. Toes downgoing.     NIHSS  *** Modified Rankin  ***      ASSESSMENT: Katherine Carrillo is a 75 y.o. year old female with recent left frontal lobe strokes, embolic secondary to unclear source on 01/22/2021 after presenting with new onset dysarthria.  Vascular risk factors include aborted stroke vs TIA s/p  tPA 08/2019, remote left-sided stroke 2011, HTN, HLD, DM, advanced age and PVD.      PLAN:  Multiple recurrent strokes: Residual deficit: ***. Continue clopidogrel 75 mg daily  and Crestor for secondary stroke prevention.  Discussed secondary stroke prevention measures and importance of close PCP follow up for aggressive stroke risk factor management. I have gone over the pathophysiology of stroke, warning signs and symptoms, risk factors and their management in some detail with instructions to go to the closest emergency room for symptoms of concern. HTN: BP goal <130/90.  Stable on *** per PCP HLD: LDL goal <70. Recent LDL 83.  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 Recent A1c 5.1.     Follow up in *** or call earlier if needed   CC:  [facility] provider: Dr. Leonie Man PCP: Curt Jews, PA-C    I spent *** minutes of face-to-face and non-face-to-face time with patient.  This included previsit chart review, lab review, study review, order entry, electronic health record documentation, patient education regarding recent stroke, residual deficits, importance of managing stroke risk factors and answered all questions to patient satisfaction     Frann Rider, [facility]  [facility] [address] [place], [place] [address]  Phone [phone] Fax [phone] Note: This document was prepared with digital dictation and possible smart phrase technology. Any transcriptional errors that result from this process are unintentional.
